# Patient Record
Sex: Male | Born: 1935 | Race: White | Hispanic: No | Marital: Married | Smoking: Former smoker
Health system: Southern US, Community
[De-identification: ages and names within clinical notes are randomized; demographics above are authoritative.]

## PROBLEM LIST (undated history)

## (undated) DIAGNOSIS — R1310 Dysphagia, unspecified: Secondary | ICD-10-CM

## (undated) DIAGNOSIS — J449 Chronic obstructive pulmonary disease, unspecified: Secondary | ICD-10-CM

## (undated) DIAGNOSIS — E785 Hyperlipidemia, unspecified: Secondary | ICD-10-CM

## (undated) DIAGNOSIS — F0150 Vascular dementia without behavioral disturbance: Secondary | ICD-10-CM

## (undated) DIAGNOSIS — K219 Gastro-esophageal reflux disease without esophagitis: Secondary | ICD-10-CM

## (undated) DIAGNOSIS — R011 Cardiac murmur, unspecified: Secondary | ICD-10-CM

## (undated) DIAGNOSIS — M1990 Unspecified osteoarthritis, unspecified site: Secondary | ICD-10-CM

## (undated) DIAGNOSIS — E119 Type 2 diabetes mellitus without complications: Secondary | ICD-10-CM

## (undated) DIAGNOSIS — I10 Essential (primary) hypertension: Secondary | ICD-10-CM

## (undated) DIAGNOSIS — Z8673 Personal history of transient ischemic attack (TIA), and cerebral infarction without residual deficits: Secondary | ICD-10-CM

## (undated) HISTORY — PX: TRANSURETHRAL RESECTION OF PROSTATE: SHX73

## (undated) HISTORY — DX: Chronic obstructive pulmonary disease, unspecified: J44.9

## (undated) HISTORY — DX: Dysphagia, unspecified: R13.10

## (undated) HISTORY — DX: Type 2 diabetes mellitus without complications: E11.9

## (undated) HISTORY — DX: Hyperlipidemia, unspecified: E78.5

## (undated) HISTORY — DX: Vascular dementia, unspecified severity, without behavioral disturbance, psychotic disturbance, mood disturbance, and anxiety: F01.50

## (undated) HISTORY — DX: Personal history of transient ischemic attack (TIA), and cerebral infarction without residual deficits: Z86.73

## (undated) HISTORY — DX: Essential (primary) hypertension: I10

## (undated) HISTORY — PX: EYE SURGERY: SHX253

---
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
## 1969-06-08 HISTORY — PX: INGUINAL HERNIA REPAIR: SUR1180

## 1999-06-09 HISTORY — PX: ESOPHAGEAL DILATION: SHX303

## 2002-11-10 ENCOUNTER — Encounter: Payer: Self-pay | Admitting: Otolaryngology

## 2002-11-10 ENCOUNTER — Ambulatory Visit (HOSPITAL_COMMUNITY): Admission: RE | Admit: 2002-11-10 | Discharge: 2002-11-10 | Payer: Self-pay | Admitting: Otolaryngology

## 2005-02-12 ENCOUNTER — Ambulatory Visit (HOSPITAL_COMMUNITY): Admission: RE | Admit: 2005-02-12 | Discharge: 2005-02-12 | Payer: Self-pay | Admitting: General Surgery

## 2005-02-12 ENCOUNTER — Ambulatory Visit (HOSPITAL_COMMUNITY): Admission: RE | Admit: 2005-02-12 | Discharge: 2005-02-12 | Payer: Self-pay | Admitting: Internal Medicine

## 2005-02-12 ENCOUNTER — Ambulatory Visit: Payer: Self-pay | Admitting: Internal Medicine

## 2006-10-18 ENCOUNTER — Ambulatory Visit: Payer: Self-pay | Admitting: Cardiology

## 2006-11-29 ENCOUNTER — Ambulatory Visit: Payer: Self-pay | Admitting: Cardiology

## 2006-12-06 ENCOUNTER — Inpatient Hospital Stay (HOSPITAL_BASED_OUTPATIENT_CLINIC_OR_DEPARTMENT_OTHER): Admission: RE | Admit: 2006-12-06 | Discharge: 2006-12-06 | Payer: Self-pay | Admitting: Cardiovascular Disease

## 2006-12-06 ENCOUNTER — Ambulatory Visit: Payer: Self-pay | Admitting: Cardiovascular Disease

## 2006-12-18 ENCOUNTER — Ambulatory Visit: Payer: Self-pay | Admitting: Cardiology

## 2007-01-20 ENCOUNTER — Ambulatory Visit: Payer: Self-pay | Admitting: Cardiology

## 2007-08-20 ENCOUNTER — Ambulatory Visit: Payer: Self-pay | Admitting: Cardiology

## 2007-09-15 ENCOUNTER — Ambulatory Visit: Payer: Self-pay | Admitting: Cardiology

## 2007-10-21 ENCOUNTER — Ambulatory Visit: Payer: Self-pay | Admitting: Cardiology

## 2008-11-25 ENCOUNTER — Ambulatory Visit: Payer: Self-pay | Admitting: Cardiology

## 2009-10-08 HISTORY — PX: CATARACT EXTRACTION W/ INTRAOCULAR LENS IMPLANT: SHX1309

## 2010-07-20 ENCOUNTER — Ambulatory Visit (HOSPITAL_COMMUNITY)
Admission: RE | Admit: 2010-07-20 | Discharge: 2010-07-21 | Payer: Self-pay | Source: Home / Self Care | Admitting: Ophthalmology

## 2010-07-20 ENCOUNTER — Encounter (INDEPENDENT_AMBULATORY_CARE_PROVIDER_SITE_OTHER): Payer: Self-pay | Admitting: Ophthalmology

## 2010-08-29 ENCOUNTER — Ambulatory Visit (HOSPITAL_COMMUNITY): Admission: RE | Admit: 2010-08-29 | Discharge: 2010-08-30 | Payer: Self-pay | Admitting: Ophthalmology

## 2010-08-29 HISTORY — PX: RETINAL DETACHMENT SURGERY: SHX105

## 2010-12-19 LAB — BASIC METABOLIC PANEL
BUN: 10 mg/dL (ref 6–23)
CO2: 30 mEq/L (ref 19–32)
Calcium: 9.3 mg/dL (ref 8.4–10.5)
Chloride: 103 mEq/L (ref 96–112)
Creatinine, Ser: 0.99 mg/dL (ref 0.4–1.5)
GFR calc Af Amer: >60 mL/min (ref >60)
GFR calc non Af Amer: >60 mL/min (ref >60)
Glucose, Bld: 108 mg/dL — ABNORMAL HIGH (ref 70–99)
Potassium: 4.5 mEq/L (ref 3.5–5.1)
Sodium: 138 mEq/L (ref 135–145)

## 2010-12-19 LAB — CBC
HCT: 44.2 % (ref 39.0–52.0)
Hemoglobin: 15.1 g/dL (ref 13.0–17.0)
MCH: 31.4 pg (ref 26.0–34.0)
MCHC: 34.2 g/dL (ref 30.0–36.0)
MCV: 91.9 fL (ref 78.0–100.0)
Platelets: 235 10*3/uL (ref 150–400)
RBC: 4.81 MIL/uL (ref 4.22–5.81)
RDW: 12.8 % (ref 11.5–15.5)
WBC: 7.8 10*3/uL (ref 4.0–10.5)

## 2010-12-19 LAB — SURGICAL PCR SCREEN
MRSA, PCR: NEGATIVE
Staphylococcus aureus: POSITIVE — AB

## 2010-12-21 LAB — BASIC METABOLIC PANEL
BUN: 12 mg/dL (ref 6–23)
CO2: 32 mEq/L (ref 19–32)
Chloride: 100 mEq/L (ref 96–112)
GFR calc non Af Amer: >60 mL/min (ref >60)
Glucose, Bld: 114 mg/dL — ABNORMAL HIGH (ref 70–99)
Potassium: 4.4 mEq/L (ref 3.5–5.1)
Sodium: 136 mEq/L (ref 135–145)

## 2010-12-21 LAB — CBC
HCT: 47.4 % (ref 39.0–52.0)
Hemoglobin: 16.4 g/dL (ref 13.0–17.0)
MCH: 31.8 pg (ref 26.0–34.0)
MCHC: 34.6 g/dL (ref 30.0–36.0)
MCV: 91.9 fL (ref 78.0–100.0)
RDW: 12.8 % (ref 11.5–15.5)

## 2010-12-21 LAB — SURGICAL PCR SCREEN: MRSA, PCR: NEGATIVE

## 2011-02-20 NOTE — Assessment & Plan Note (Signed)
[facility]                          [facility]   NAME:Luis [name], Luis [name]                      MRN:          [id number]  DATE:10/21/2007                            DOB:          28-Feb-1934    HISTORY OF PRESENT ILLNESS:  The patient is a 77 year old male with a  history of coronary artery disease treated medically.  The patient has  normal LV function.  The patient is doing quite well.  He denies any  substernal  chest pain on exertion.  He is an NYHA class II.  He has no  orthopnea, PND.  He has no palpitations or syncope.  Total cholesterol  recently measured was 146.  His EKG today demonstrates no significant  ischemic abnormalities.   MEDICATIONS:  1. Simvastatin 20 mg p.o. q.day.  2. Norvasc 5 mg p.o. q.day.  3. __________ 2.5 mg p.o. q.day  4. Advair 100/50 two puffs q.day.  5. Proscar 5 mg p.o. q.day.  6. Prilosec over-the-counter.  7. Aspirin 81 mg a day.  8. Fish oil.  9. Vitamin D and multiple vitamin.  10.Vitamin B.  11.Potassium supplements.   PHYSICAL EXAMINATION:  VITAL SIGNS:  Blood pressure:  151/80.  Heart  rate: 55 beats per minute.  Weight:  198 pounds.  NECK:  Normal carotid upstroke.  No carotid bruits.  LUNGS:  Clear.  Breath sounds bilaterally.  HEART:  Regular rate and rhythm with normal S1, S2 and no murmurs, rubs  or gallops.  ABDOMEN:  Soft, nontender.  No rebound or guarding.  Good bowel sounds.  EXTREMITIES:  No cyanosis, clubbing or edema.   PROBLEM:  1. Two vessel coronary artery disease treated medically.  2. Normal LV function.  3. Questionable small atrial septal defect with no significant step-up      by right heart catheterization.  4. Hypertension.  Controlled.  5. Bradycardia. Asymptomatic.   PLAN:  1. The patient is doing well from a cardiovascular perspective.  I      have made no adjustments to his medication.  2. The patient to consult with his primary care physician regarding     his cholesterol panel.  3. Depression.  Can be seen back in one year.     Learta Codding, MD,FACC  Electronically Signed    GED/MedQ  DD: 10/21/2007  DT: 10/21/2007  Job #: [id number]   cc:   Kirstie Peri, MD

## 2011-02-20 NOTE — Assessment & Plan Note (Signed)
[facility]                          [facility] NOTE   NAME:SCOTTBladyn, Tipps                      MRN:          [id number]  DATE:08/20/2007                            DOB:          Jun 04, 1934    HISTORY OF PRESENT ILLNESS:  Patient is a 77 year old male with history  of 2-vessel coronary artery disease.  He has been treated medically.  The patient has normal LV function.  Previously he was taken off of beta  blockers due to sinus bradycardia and syncope.  The patient also  demonstrated chronotropic insufficiency on stress testing.  He now  reports __________  but he does not have near syncope.  Sometimes he  reports some dizziness with the absence of chest pain, shortness of  breath, orthopnea and PND.  Orthostatics were done in the office today  which were within normal limits.   MEDICATIONS:  1. Advair 100/50 two puffs daily.  2. Proscar 5 mg p.o. daily.  3. Indapamide 0.5 mg daily.  4. Prilosec OTC 20 mg a day.  5. Aspirin 81 mg a day.  6. Fish oil.  7. Norvasc 5 mg p.o. daily.  8. Zocor 20 mg p.o. daily.   PHYSICAL EXAMINATION:  VITAL SIGNS:  Blood pressure 115/69, heart rate  59.  NECK:  Normal carotid upstroke and no carotid bruits.  LUNGS:  Clear.  HEART:  Regular rate and rhythm, normal S1-S2.  ABDOMEN:  Soft.  EXTREMITY:  No cyanosis, clubbing, or edema.   PROBLEM LIST:  1. Two vessel coronary artery disease treated medically.  2. Normal left ventricular function.  3. Questionable small atrial septal defect by 2D echo (no significant      __________  at right heart catheterization).  4. Hypertension, controlled.  5. Bradycardia.      a.     Currently asymptomatic.      b.     History of near syncope, rule out chronotropic       insufficiency.   PLAN:  1. Patient will have a CardioNet monitor applied to make sure His does      not have any significant bradycardias, although symptomatically      appears to be  improved.  2. From a cardiovascular perspective, patient is stable and he reports      no substernal chest      pain.  No further ischemia without is needed.  3. The patient will follow up with [place] in the next couple of months.     Learta Codding, MD,FACC  Electronically Signed    GED/MedQ  DD: 09/15/2007  DT: 09/15/2007  Job #: [id number]   cc:   Kirstie Peri, MD

## 2011-02-23 NOTE — Op Note (Signed)
NAME:  Luis [name], Luis [name]               ACCOUNT NO.:  [id number]   MEDICAL RECORD NO.:  [id number]          PATIENT TYPE:  AMB   LOCATION:  DAY                           FACILITY:  [facility]   PHYSICIAN:  R. Roetta Sessions, M.D. DATE OF BIRTH:  1934-10-08   DATE OF PROCEDURE:  DATE OF DISCHARGE:  02/12/2005                                 OPERATIVE REPORT   PROCEDURE:  Esophageal manometry.   REFERRING PHYSICIAN:  Sherilyn Cooter A. Cleotis Nipper, M.D.   INDICATIONS:  Dysphagia.   PRIOR STUDIES:  EGD on 12/21/2003 revealed a normal-appearing, upper, mid,  and lower esophagus, but the GE junction appeared to be grossly normal  except for a little bit narrow.  This was dilated with up to 21 [place] with  balloon dilatation.   METHOD:  The esophageal manometry study was performed using [employer]  Medical Gastrosoft Upper Gastrointestinal Polygram, version 6.40, using an  Arndorfer infusion system at 0.5 cc/min. The lower esophageal sphincter  (LES) was identified in four ports with the standard station pullthrough  technique. For esophageal body pressures, the tip of the catheter was placed  3 cm above the proximal aspect of the LES. Ten wet swallows were taken with  ports 5 cm apart in the esophageal body. This procedure was reviewed with  the patient prior to performing it.   FINDINGS:  Esophageal body amplitude (mmHg):  24.9 at 13 cm (normal 38-102),  189.1 at 8 cm (normal 49-131), 41.2 at 3 cm (normal 64-154), 115.2 mean  (distal two ports) (normal 59-139).   Duration (seconds):  5.3 at 13 cm (normal 3-5), 8.3 at 8 cm (normal 3-5), 8  at 3 cm (normal 2.5-3.5), 8.2 mean (distal two ports) (normal 3.5).   Contractions (with wet swallows):  9 of 9 swallows or simultaneous.   LOWER ESOPHAGEAL SPHINCTER (LES):  Located at 50-46 cm (from nares).  Resting pressure:  27.4 mmHg.  Relaxation:  67.3%.   IMPRESSION:  Manometrically this patient appears to have achalasia.  There  were isobaric pressure changes  with swallows.  No evidence of peristalsis.  In addition, channel 3 analysis revealed a high amplitude of swallows with a  prolonged duration of swallows in all channels.   RECOMMENDATIONS:  This study has been reviewed with Dr. Karilyn Cota who is in  agreement that this patient appears to have achalasia based on this  manometric study.  Would recommend patient to have a barium swallow, if not  previously done.  Would consider a tertiary center referral for possible  Heller's myotomy.  If any further assistance is needed, please contact our  office at [phone].      LL/MEDQ  D:  02/23/2005  T:  02/23/2005  Job:  [id number]   cc:   Sherilyn Cooter A. Cleotis Nipper, M.D.  [address]  [place]  [place] [address]  Fax: [phone]

## 2011-02-23 NOTE — Assessment & Plan Note (Signed)
[facility]                          [facility] NOTE   Luis [name], Luis [name]                      MRN:          [id number]  DATE:12/18/2006                            DOB:          28-Sep-1934    CARDIOLOGIST:  Learta Codding, MD,FACC   PRIMARY CARE PHYSICIAN:  Kirstie Peri, MD   HISTORY OF PRESENT ILLNESS:  Luis [name] is a 77 year old male patient who  was seen initially on November 29, 2006, with recent history of near  syncopal episode.  A 2D echocardiogram showed normal LV function and no  wall motion abnormalities and a question of a small ASD. He also had an  adenosine Myoview that revealed moderate inferior defect suggestive of  either scar or diaphragmatic attenuation. The patient also noted some  exertional dyspnea. On February 22, his Diltiazem was discontinued and  he was placed on Norvasc 5 mg a day. He was set up for outpatient  cardiac catheterization. This was done on December 06, 2006, by Dr.  Excell Seltzer. There were normal right heart hemodynamics without evidence of a  significant oxygen step up. He did have two vessel coronary disease that  was treated medically. Specifically, he had a diagonal with a 75% ostial  lesion and a left posterolateral branch with an 80% distal lesion.  Again, these were treated medically. He was placed on metoprolol 25 mg  twice a day. He returns to the [place] office today for followup.   Overall, he notes that his dyspnea on exertion is improved. He denies  any chest pain. He denies any syncope. He denies any further episodes of  near syncope. He denies any significant fatigue.   CURRENT MEDICATIONS:  1. Advair 100/50 two puffs per day.  2. Proscar 5 mg daily.  3. Indapamide 2.5 mg daily.  4. Prilosec over-the-counter 20 mg a day.  5. Aspirin 81 mg daily.  6. Red Yeast rice 600 mg two tablets a day.  7. Fish oil 1 gram three tablets daily.  8. Multivitamin.  9. Vitamin D.  10.Norvasc 5 mg a  day.  11.Metoprolol 25 mg b.i.d.   ALLERGIES:  No known drug allergies.   PHYSICAL EXAMINATION:  He is a well-nourished, well-developed male in no  acute distress. Blood pressure is 127/72, pulse rate is 49, weight is  198.6 pounds.  HEENT: Is unremarkable.  NECK: Without JVD. Carotids without bruits bilaterally.  CARDIAC: Normal S1, S2, regular rate and rhythm without murmur.  LUNGS:  Are clear to auscultation bilaterally without wheezing, rhonchi  or rales.  ABDOMEN: Soft and nontender.  EXTREMITIES: Without edema. Right groin without significant hematoma or  bruit.  NEUROLOGIC: He is alert and oriented x3. Cranial nerves II-XII are  grossly intact.   ELECTROCARDIOGRAM: Reveals sinus bradycardia with a heart rate of 47.  Normal axis and no acute changes.   IMPRESSION:  1. Two vessel coronary artery disease as described above.      a.     Medical therapy.  2. Good left ventricular function.  3. Question of small ASD on recent 2D echocardiogram with no  significant oxygen step up on right heart catheterization.  4. Hypertension, controlled.  5. Sinus bradycardia.      a.     Fairly asymptomatic at this point in time.  6. Gastroesophageal reflux disease with a history of esophageal spasm.  7. Dyslipidemia.      a.     Labs November 2007 with an LDL 117, HDL of 55. Total       cholesterol is 187. Triglycerides of 76.   PLAN:  The patient presents to the office today for followup post  catheterization. Overall, he is doing well. He was placed on metoprolol  at his catheterization for treatment of his coronary disease and his  dyspnea which was felt to be an angina equivalent. The patient has  actually felt well since that time. However, since he did present to [place]  initially with a near-syncopal episode and he is significantly  bradycardic, we have elected to take him off of the metoprolol. I  discussed this with Dr. Andee Lineman, who agreed. We will also ask him to stop  his  Red Yeast rice and to start on a statin. Given his proven coronary  disease at cardiac catheterization and an LDL above 70, we feel it is  best to treat this with a statin. Therefore, he will be placed on Zocor  20 mg QHS. Will have him followup with lipids and LFTs in 6 to 8 weeks.  He has been given nitroglycerine to take p.r.n. chest pain should that  ever occur. We have asked him to followup in this office in the next 4  to 6 weeks. At that time, if he continues to remain bradycardic, we will  set him up for a 48-hour Holter monitor to rule out any significant  arrhythmias or pauses. If he  has worsening dyspnea on exertion we can certainly try to up-titrate his  amlodipine or initiate Imdur for anti-anginal effect.      Tereso Newcomer, PA-C  Electronically Signed      Learta Codding, MD,FACC  Electronically Signed   SW/MedQ  DD: 12/18/2006  DT: 12/19/2006  Job #: [id number]   cc:   Kirstie Peri, MD

## 2011-02-23 NOTE — Assessment & Plan Note (Signed)
[facility]                          [facility]   NAME:Luis [name], Redmann                      MRN:          [id number]  DATE:11/29/2006                            DOB:          17-Apr-1934    REASON FOR CONSULTATION:  Mr. Edon is a 77 year old male, with no  prior cardiac history, now referred for further evaluation of a recent  presyncopal episode.  Additionally, he underwent a subsequent workup  with a 2D echocardiogram and an adenosine stress Cardiolite.  The  echocardiogram showed preserved left ventricular function with no wall  motion abnormalities and question of a small ASD.  The adenosine stress  test suggested a moderate inferior defect suggestive of either scar or  diaphragmatic attenuation, but with no large area of ischemia.  Calculated ejection fraction was 61% and there was a probable mild  inferior hypokinesis.   The patient describes this recent episode of presyncope as occurring  quite suddenly while he was seated at a bar with his wife.  It happened  about a month ago and there was no associated chest discomfort or tachy  palpitations.  Of note, in fact, the patient states that he has never  had any chest discomfort either remotely, or in recent past.  He did  feel a sensation of clamminess and the episode lasted about 2 minutes  in duration.  He has not had any subsequent episodes.  He seems to also  suggest that he has had some similar episodes prior to this, but not of  such long duration.   The patient also reports some development of exertional dyspnea over the  past 6-12 months, but once again with no associated chest discomfort.  He also has felt a sensation of tiredness in both upper thighs which  is relieved with rest.   Electrocardiogram today reveals sinus bradycardia with borderline first  degree AV block, normal axis, and no ischemic changes.   ALLERGIES:  NO KNOWN DRUG ALLERGIES.   CURRENT  MEDICATIONS:  1. Aspirin 81 daily.  2. Diltiazem 120 daily.  3. Advair 100/50 two puffs daily.  4. Proscar 5 daily.  5. Indapamide 2.5 daily.  6. Prilosec OTC 20 daily.  7. Red Yeast Rice 1200 mg daily.  8. Fish oil 3000 mg daily.   PAST MEDICAL HISTORY:  1. Hypertension.  2. Esophageal spasm.      a.     Diagnosed approximately 1 year ago by Dr. Jacqulyn Bath at [facility], and       treated with Diltiazem.  3. GERD.  4. Remote tobacco.   SURGICAL HISTORY:  1. Status post bilateral hernia repair.  2. TURP.  3. Right shoulder arthroscopic surgery.   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  He has not smoked tobacco sine 1987 but previously smoked up to 2  packs a day since the age of 48.  Drinks only an occasional beer.   FAMILY HISTORY:  Father died age 70, question  of myocardial infarction.  Mother deceased age 64, complications of stroke.   REVIEW OF SYSTEMS:  Denies any history of myocardial infarction,  congestive heart failure, or stroke.  No known history of diabetes  mellitus or hyperlipidemia.  Denies any history of palpitations.  Denies  orthopnea, PND, or lower extremity edema.  Otherwise as per HPI,  remaining systems negative.   PHYSICAL EXAMINATION:  Blood pressure 138/76, pulse 50 regular, weight  198.8.  GENERAL:  A 77 year old male sitting upright in no apparent distress.  HEENT:  Normocephalic/atraumatic.  NECK:  Palpable bilateral carotid pulses without bruits; no JVD.  LUNGS:  Clear to auscultation all fields.  HEART:  Regular rate and rhythm (S1, S2) no significant murmurs, rubs,  or gallops.  ABDOMEN:  Soft, nontender, intact bowel sounds.  EXTREMITIES:  Palpable bilateral femoral pulses without bruits;  minimally palpable posterior tibialis and dorsalis pedis pulse on the  right but with brisk pulses on the left and bilateral popliteal pulses;  no  pedal edema.  NEURO:  No focal deficit.   IMPRESSION:  1. Status post near syncopal episode.  2. Exertional dyspnea.      a.     Normal left ventricular function by recent 2D echo.      b.     Question anginal equivalent.      c.     Recent abnormal adenosine Cardiolite, suggestive of inferior       defect; ejection fraction 61% with probable mild inferior       hypokinesis.  3. Remote tobacco.  4. Sinus bradycardia/first degree atrioventricular block.   PLAN:  1. Recommendation is to pursue further evaluation with a diagnostic      cardiac catheterization, in light of the recent abnormal stress      test, to rule out significant underlying coronary artery disease.      It may be that the patient's exertional dyspnea is his anginal      equivalent.  Of note, we will schedule this as a right/left cardiac      catheterization for further assessment of right heart pressures as      well as question of possible small ASD as seen on 2D echo.  2. Discontinue Diltiazem and start Norvasc 5 daily for treatment of      esophageal spasm, particularly in light of the patient's resting      bradycardia and recent episode of near syncope.  3. The patient is in agreement with this plan and the risks/benefits      of the procedure were discussed by Dr. Lewayne Bunting.     Gene Serpe, PA-C  Electronically Signed    GS/MedQ  DD: 11/29/2006  DT: 11/29/2006  Job #: [id number]   cc:   Kirstie Peri, MD

## 2011-02-23 NOTE — Assessment & Plan Note (Signed)
[name]                          [facility] NOTE   COLE, EASTRIDGE                      MRN:          [id number]  DATE:01/20/2007                            DOB:          1934-06-02    CARDIOLOGIST:  Dr. Andee Lineman.   PRIMARY CARE PHYSICIAN:  Dr. Sherryll Burger   HISTORY OF PRESENT ILLNESS:  Mr. Glasscock is a 77 year old male patient who  returns to the office today for followup.  Please see my last note from  December 18, 2006.  He had been evaluated for near syncope and dyspnea with  exertion.  Cardiac catheterization revealed 2 vessel CAD that was  treated medically.  He had good left ventricular function.  There was a  question of a small ASD on the recent 2-D echocardiogram with no  significant oxygen step-up on right heart catheterization at time of  visit of left heart catheterization.  I took him off of his metoprolol  last time because he was significantly bradycardic.  We also placed him  on Zocor to treat his cholesterol, to get him down to an LDL of less  than 70. He returns today for followup.  He notes that since stopping  the metoprolol he does feel somewhat better.  He feels like he has more  energy.  He still notes dyspnea on exertion.  He does tell me that he  had some problems with dyspnea on exertion over the last several years  and was actually on Advair for COPD.  He has noted in the past that if  he skips his Advair for several days, he will become more short of  breath.  He denies any chest discomfort.  He denies any syncope or near  syncope.   CURRENT MEDICATIONS:  1. Advair 100/50 two puffs daily.  2. Proscar 5 mg daily.  3. Dopamine 2.5 mg daily.  4. Prilosec OTC 20 mg daily.  5. Aspirin 81 mg daily.  6. Fish oil.  7. Multivitamin.  8. Vitamin D.  9. Norvasc 5 mg daily.  10.Zocor 20 mg q.h.s.  11.Nitroglycerine p.r.n. chest pain.   ALLERGIES:  No known drug allergies.   PHYSICAL EXAMINATION:  He is a  well-nourished, well-developed male in no  acute distress.  His blood pressure is 151/77, pulse 82, weight 198.8 pounds.  Repeat  blood pressure by me is 120/76 bilaterally on a manual cuff.  HEENT:  Is unremarkable.  NECK:  Without JVD.  CARDIAC:  Normal S1, S2. Regular rate and rhythm.  LUNGS:  Are clear to auscultation bilaterally without wheezing, rhonchi  or rales.  ABDOMEN:  Soft, nontender with normal active bowel sounds.  No  organomegaly.  EXTREMITIES:  Without edema.  Calves nontender.  SKIN:  Is warm and dry.  NEUROLOGIC:  He is alert and oriented x3.  Cranial nerves II through XII  grossly intact.   Electrocardiogram reveals a sinus rhythm with a heart rate of 51 with PR  interval of 212 milliseconds.  No ischemic changes.   IMPRESSION:  1. Two vessel coronary artery disease, treated medically.  a.     At catheterization 75% ostial diagonal lesion and 80% distal       left post lateral branch lesion.      b.     Medical therapy.  2. Good left ventricular function.  3. Question small atrial septal defect on 2-D echocardiogram with no      significant oxygen step-up on right heart catheterization.  4. Hypertension, controlled.  5. Sinus bradycardia.      a.     Asymptomatic.      b.     Recent history of near syncope, prompting workup for       coronary disease as outlined above.      c.     Gastroesophageal reflux disease with a history of esophageal       spasm.      d.     Dyslipidemia, treated.   PLAN:  The patient presents back to the office today for followup.  We  took him off of his metoprolol last time and he still remains  bradycardic.  He has not had any further episodes of near syncope and  denies any history of syncope.  Since we did initially see him for that  chief complaint, we will go ahead and set him up for a 48 hour Holter  monitor to rule out significant bradyarrhythmias.  We will continue all  of his other medications at this point in  time.  We will make sure he  has lipids and LFTs followed up in the next 2 to 4 weeks.  As long as no  significant abnormalities show up on his Holter monitor, he will return  to see [place] in this office in 6 months' time.      Tereso Newcomer, PA-C  Electronically Signed      Learta Codding, MD,FACC  Electronically Signed   SW/MedQ  DD: 01/20/2007  DT: 01/20/2007  Job #: [id number]   cc:   Kirstie Peri, MD

## 2011-02-23 NOTE — Cardiovascular Report (Signed)
NAME:  Luis [name], Luis [name] NO.:  [id number]   MEDICAL RECORD NO.:  [id number]          PATIENT TYPE:  OIB   LOCATION:  1963                         FACILITY:  [facility]   PHYSICIAN:  Veverly Fells. Excell Seltzer, MD  DATE OF BIRTH:  14-Nov-1933   DATE OF PROCEDURE:  12/06/2006  DATE OF DISCHARGE:                            CARDIAC CATHETERIZATION   PROCEDURE:  Left heart catheterization, right heart catheterization,  selective coronary angiography, left ventricular angiography.   INDICATIONS:  Luis [name] is a very nice 77 year old gentleman who has  exertional chest pain.  He underwent an echocardiogram that raised a  question of a small ASD.  He has had normal left ventricular function  with an EF of 61%.  He underwent an adenosine stress test that suggested  a moderate fixed defect in the inferior wall, either scar or  diaphragmatic attenuation, but no large area of ischemia.  With his  abnormal stress test and question of ASD, he was referred for right and  left heart catheterization.   DESCRIPTION OF PROCEDURE:  The risks and indications of the procedure  were explained to the patient.  Informed consent was obtained.  The  right groin was prepped, draped and anesthetized with 1% lidocaine.  Using the modified Seldinger technique a 7-French sheath was placed in  the right femoral vein and a 4-French sheath was placed in the right  femoral artery.  The right heart catheterization was performed first  using a Swan-Ganz catheter.  Pressures were measured throughout the  right heart chambers from the right atrium to the pulmonary capillary  wedge position.  Oxygen saturation's were drawn from the superior vena  cava, pulmonary artery and central aorta.  Fick cardiac output was  calculated.  Following the right heart catheterization, selective  coronary angiography was performed using standard 4-French catheters.  Multiple views of the left coronary artery were taken and a single  view  of the right coronary artery was taken.  An angled pigtail catheter was  then inserted into the left ventricle and pressures were recorded.  A 30  degrees right anterior oblique left ventriculogram was done.  A pullback  across the aortic valve was performed.  Following the left heart  catheterization I made an attempt to cross the presumed ASD with the  Swan-Ganz catheter, but was unable to do so.  At the conclusion of the  case the femoral, venous, and arterial sheaths were pulled and manual  pressure used for hemostasis.  There were no complications.   FINDINGS:  Right atrial pressure A-wave 4, V-wave 1, mean of 0.  Right  ventricular pressure is 24/0 with an end-diastolic pressure of 4.  Pulmonary artery pressures is 24/5 with a mean of 14.  Pulmonary  capillary wedge pressures: A-wave 6, V-wave 3, mean of 3.  Left  ventricular pressures: 126/0 with an end-diastolic pressure of 7, aortic  pressure is 126/57 with a mean of 87.  Oxygen saturation's as follows:  SVC 66, PA 73, AO 93.   Fick cardiac output was 5.5 liters per minute.  Cardiac index is 2.6  liters  per minute per meter squared.   Selective coronary angiography demonstrated the following:  Left main  coronary artery is angiographically normal.  It bifurcates into the LAD  and left circumflex.  The LAD is a large-caliber vessel that courses  down the left ventricular apex.  There is no significant disease  throughout the proximal, mid or distal LAD.  There is a very large  diagonal branch that is bigger than the LAD.  This vessel has a 75%  ostial smooth stenosis.   Left circumflex is dominant.  It gives off a distal left posterolateral  branch as well as a left PDA.  The proximal and mid circumflex are  tortuous but have no significant obstructive disease.  The left PDA has  no significant disease.  The left posterolateral branch has a focal 80%  stenosis out distally.  The vessel through this portion is small.    Right coronary artery is nondominant and small.  It is angiographically  normal.   Left ventricular function, as assessed by left ventriculography,  demonstrates normal wall motion with an overall ejection fraction of 65%  and no mitral regurgitation.   ASSESSMENT:  1. Two-vessel coronary artery disease involving a large first diagonal      branch of the left anterior descending and a left posterolateral      branch of the circumflex.  2. Normal left ventricular function.  3. Normal right heart hemodynamics without evidence of a significant      oxygen step-up.   PLAN:  I recommend medical therapy for Luis [name].  He did not have a  reversible defect on his stress study.  His anatomy was somewhat  challenging for consideration of percutaneous intervention, as the  diagonal stenosis involves the ostium of that vessel. It will be  difficult to treat this and protect the LAD.  The other issue is the  left posterolateral branch is quite small and would involve a small bare  metal stent.  I think if we were able to control Luis [name] angina  medically he would be better served with that approach.  If not, he  could be considered for percutaneous intervention if medical therapy  fails.      Veverly Fells. Excell Seltzer, MD  Electronically Signed     MDC/MEDQ  D:  12/06/2006  T:  12/06/2006  Job:  [id number]   cc:   Learta Codding, MD,FACC

## 2011-09-19 ENCOUNTER — Encounter (INDEPENDENT_AMBULATORY_CARE_PROVIDER_SITE_OTHER): Payer: Medicare Other | Admitting: Ophthalmology

## 2011-09-19 DIAGNOSIS — H33309 Unspecified retinal break, unspecified eye: Secondary | ICD-10-CM

## 2011-09-19 DIAGNOSIS — H35379 Puckering of macula, unspecified eye: Secondary | ICD-10-CM

## 2011-09-19 DIAGNOSIS — H33009 Unspecified retinal detachment with retinal break, unspecified eye: Secondary | ICD-10-CM

## 2011-09-19 DIAGNOSIS — H43819 Vitreous degeneration, unspecified eye: Secondary | ICD-10-CM

## 2012-01-02 ENCOUNTER — Other Ambulatory Visit: Payer: Self-pay | Admitting: Dermatology

## 2012-09-19 ENCOUNTER — Ambulatory Visit (INDEPENDENT_AMBULATORY_CARE_PROVIDER_SITE_OTHER): Payer: Medicare Other | Admitting: Ophthalmology

## 2012-10-09 ENCOUNTER — Ambulatory Visit (INDEPENDENT_AMBULATORY_CARE_PROVIDER_SITE_OTHER): Payer: Medicare Other | Admitting: Ophthalmology

## 2012-10-09 DIAGNOSIS — H33009 Unspecified retinal detachment with retinal break, unspecified eye: Secondary | ICD-10-CM

## 2012-10-09 DIAGNOSIS — H2510 Age-related nuclear cataract, unspecified eye: Secondary | ICD-10-CM

## 2012-10-09 DIAGNOSIS — H35349 Macular cyst, hole, or pseudohole, unspecified eye: Secondary | ICD-10-CM

## 2012-10-09 DIAGNOSIS — H43819 Vitreous degeneration, unspecified eye: Secondary | ICD-10-CM

## 2012-10-09 DIAGNOSIS — H33309 Unspecified retinal break, unspecified eye: Secondary | ICD-10-CM

## 2012-10-15 ENCOUNTER — Other Ambulatory Visit: Payer: Self-pay | Admitting: Dermatology

## 2012-10-27 ENCOUNTER — Ambulatory Visit (INDEPENDENT_AMBULATORY_CARE_PROVIDER_SITE_OTHER)
Admission: RE | Admit: 2012-10-27 | Discharge: 2012-10-27 | Disposition: A | Discharge status: Home / Self Care | Payer: Medicare Other | Source: Ambulatory Visit | Attending: Internal Medicine | Admitting: Internal Medicine

## 2012-10-27 ENCOUNTER — Ambulatory Visit (INDEPENDENT_AMBULATORY_CARE_PROVIDER_SITE_OTHER): Payer: Medicare Other | Admitting: Internal Medicine

## 2012-10-27 ENCOUNTER — Encounter: Payer: Self-pay | Admitting: Internal Medicine

## 2012-10-27 VITALS — BP 110/68 | HR 58 | Ht 70.5 in | Wt 194.4 lb

## 2012-10-27 DIAGNOSIS — J438 Other emphysema: Secondary | ICD-10-CM

## 2012-10-27 DIAGNOSIS — J439 Emphysema, unspecified: Secondary | ICD-10-CM

## 2012-10-27 DIAGNOSIS — J449 Chronic obstructive pulmonary disease, unspecified: Secondary | ICD-10-CM

## 2012-10-27 DIAGNOSIS — R439 Unspecified disturbances of smell and taste: Secondary | ICD-10-CM

## 2012-10-27 DIAGNOSIS — R430 Anosmia: Secondary | ICD-10-CM

## 2012-10-27 MED ORDER — IPRATROPIUM BROMIDE 0.06 % NA SOLN: NASAL | Status: DC

## 2012-10-27 NOTE — Patient Instructions (Addendum)
Script for ipratropium nasal spray- try as needed for runny nose  Order- CXR   Dx COPD             Schedule PFT and   6 MWT

## 2012-10-27 NOTE — Progress Notes (Signed)
10/27/12- 79 yo M former smoker- Pt referred by Dr Sherryll Burger. pt states having sob upon heavy activity. Wife is a patient of mine. He had smoked one to 2 packs per day for 35 years, ending in 45. 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 He was diagnosed with COPD/emphysema by pulmonologist Dr. Orson Aloe from [place]. Shortness of breath with exertion for years is getting gradually worse. Cough occasionally productive of clear mucus with no blood. He can climb one flight of stairs without stopping his breathing hard at the top. No chest pain, palpitation or swelling. He has not noticed dramatic benefit from tudorza, albuterol HFA,, Advair 250. He also complains of anosmia for at least several years. He blames this on working with ammonia fumes. He is using Flonase and Atrovent nasal sprays Father died of DVT/PE mother died of stroke. He doesn't know of any family members with lung disease.  // needs a1AT//  Prior to Admission medications   Medication Sig Start Date End Date Taking? Authorizing Provider  Aclidinium Bromide (TUDORZA PRESSAIR) 400 MCG/ACT AEPB Inhale 1 Inhaler into the lungs 2 (two) times daily.    Yes Historical Provider, MD  albuterol (PROVENTIL HFA;VENTOLIN HFA) 108 (90 BASE) MCG/ACT inhaler Inhale 2 puffs into the lungs every 6 (six) hours as needed.   Yes Historical Provider, MD  amLODipine (NORVASC) 5 MG tablet Take 5 mg by mouth daily.   Yes Historical Provider, MD  aspirin 81 MG tablet Take 81 mg by mouth daily.   Yes Historical Provider, MD  Cholecalciferol (VITAMIN D3) 2000 UNITS TABS Take 1 tablet by mouth once.   Yes Historical Provider, MD  fish oil-omega-3 fatty acids 1000 MG capsule Take 2 g by mouth daily.   Yes Historical Provider, MD  fluticasone (FLONASE) 50 MCG/ACT nasal spray Place 2 sprays into the nose daily.   Yes Historical Provider, MD  fluticasone-salmeterol (ADVAIR HFA) 230-21 MCG/ACT inhaler Inhale 2  puffs into the lungs 2 (two) times daily.   Yes Historical Provider, MD  indapamide (LOZOL) 2.5 MG tablet Take 2.5 mg by mouth every morning.   Yes Historical Provider, MD  Multiple Vitamin (MULTIVITAMIN) tablet Take 1 tablet by mouth daily.   Yes Historical Provider, MD  nitroGLYCERIN (NITROSTAT) 0.4 MG SL tablet Place 0.4 mg under the tongue every 5 (five) minutes as needed.   Yes Historical Provider, MD  omeprazole (PRILOSEC) 20 MG capsule Take 20 mg by mouth daily.   Yes Historical Provider, MD  polyethylene glycol (MIRALAX / GLYCOLAX) packet Take 17 g by mouth daily.   Yes Historical Provider, MD  potassium phosphate, monobasic, (K-PHOS ORIGINAL) 500 MG tablet Take 500 mg by mouth 2 (two) times daily.   Yes Historical Provider, MD  ipratropium (ATROVENT) 0.06 % nasal spray 1 or 2 puffs in each nostril, up to 4 x daily as needed 10/27/12 10/27/13  Waymon Budge, MD   Past Medical History  Diagnosis Date  . COPD (chronic obstructive pulmonary disease)   . Hypertension   . Dysphagia   . High cholesterol   . Emphysema   \ Past Surgical History  Procedure Date  . Prostate surgery   . Retinal detachment surgery    History reviewed. No pertinent family history. History   Social History  . Marital Status: Married    Spouse Name: N/A    Number of Children: N/A  . Years of Education: N/A   Occupational History  .  Not on file.   Social History Main Topics  . Smoking status: Former Smoker -- 2.0 packs/day for 35 years    Types: Cigarettes    Quit date: 10/08/1985  . Smokeless tobacco: Former Neurosurgeon    Types: Chew    Quit date: 10/08/1997  . Alcohol Use: No  . Drug Use: No  . Sexually Active: Not on file   Other Topics Concern  . Not on file   Social History Narrative  . No narrative on file   ROS-see HPI Constitutional:   No-   weight loss, night sweats, fevers, chills, fatigue, lassitude. HEENT:   No-  headaches, difficulty swallowing, tooth/dental problems, sore throat,         No-  sneezing, itching, ear ache, nasal congestion, post nasal drip,  CV:  No-   chest pain, orthopnea, PND, swelling in lower extremities, anasarca,                                  dizziness, palpitations Resp: + shortness of breath with exertion or at rest.              +  productive cough,  No non-productive cough,  No- coughing up of blood.              No-   change in color of mucus.  No- wheezing.   Skin: No-   rash or lesions. GI:  No-   heartburn, indigestion, abdominal pain, nausea, vomiting, diarrhea,                 change in bowel habits, loss of appetite GU: No-   dysuria, change in color of urine, no urgency or frequency.  No- flank pain. MS:  No-   joint pain or swelling.  No- decreased range of motion.  No- back pain. Neuro-     nothing unusual Psych:  No- change in mood or affect. No depression or anxiety.  No memory loss.  OBJ- Physical Exam General- Alert, Oriented, Affect-appropriate, Distress- none acute. Medium build. Skin- rash-none, lesions- none, excoriation- none Lymphadenopathy- none Head- atraumatic            Eyes- Gross vision intact, PERRLA, conjunctivae and secretions clear            Ears- Hearing, canals-normal            Nose- Clear, no-Septal dev, mucus, polyps, erosion, perforation             Throat- Mallampati II , mucosa clear , drainage- none, tonsils- atrophic. Dental repair. Neck- flexible , trachea midline, no stridor , thyroid nl, carotid no bruit Chest - symmetrical excursion , unlabored           Heart/CV- RRR , no murmur , no gallop  , no rub, nl s1 s2                           - JVD- none , edema- none, stasis changes- none, varices- none           Lung- + distant without wheeze or rhonchi.+ Light cough with deep breath, wheeze- none,  , dullness-none, rub- none           Chest wall-  Abd- tender-no, distended-no, bowel sounds-present, HSM- no Br/ Gen/ Rectal- Not done, not indicated Extrem- cyanosis- none, clubbing, none,  atrophy- none, strength- nl Neuro-  grossly intact to observation

## 2012-11-08 DIAGNOSIS — J449 Chronic obstructive pulmonary disease, unspecified: Secondary | ICD-10-CM | POA: Insufficient documentation

## 2012-11-08 DIAGNOSIS — R430 Anosmia: Secondary | ICD-10-CM | POA: Insufficient documentation

## 2012-11-08 NOTE — Assessment & Plan Note (Signed)
History is quite consistent with tobacco-induced COPD, predominantly emphysema. We will establish database Plan- chest x-ray, PFT, 6 minute walk test

## 2012-11-08 NOTE — Assessment & Plan Note (Signed)
Watery rhinorrhea bothersome especially with food. He has had ipratropium nasal spray before but has not been using it regularly. I will let him try again so he can pay particular attention to effect on food related rhinorrhea. We may not be able to restore his sense of smell if there is damage to olfactory nerves.

## 2012-11-12 ENCOUNTER — Ambulatory Visit (INDEPENDENT_AMBULATORY_CARE_PROVIDER_SITE_OTHER): Payer: Medicare Other | Admitting: Internal Medicine

## 2012-11-12 DIAGNOSIS — J449 Chronic obstructive pulmonary disease, unspecified: Secondary | ICD-10-CM

## 2012-11-12 LAB — PULMONARY FUNCTION TEST

## 2012-11-12 NOTE — Progress Notes (Signed)
PFT done today. 

## 2012-11-13 ENCOUNTER — Other Ambulatory Visit: Payer: Self-pay | Admitting: Dermatology

## 2012-11-27 ENCOUNTER — Encounter: Payer: Self-pay | Admitting: Internal Medicine

## 2012-11-27 ENCOUNTER — Other Ambulatory Visit: Payer: Medicare Other

## 2012-11-27 ENCOUNTER — Ambulatory Visit (INDEPENDENT_AMBULATORY_CARE_PROVIDER_SITE_OTHER): Payer: Medicare Other | Admitting: Internal Medicine

## 2012-11-27 VITALS — BP 102/58 | HR 66 | Ht 70.5 in | Wt 192.8 lb

## 2012-11-27 DIAGNOSIS — J438 Other emphysema: Secondary | ICD-10-CM

## 2012-11-27 DIAGNOSIS — J309 Allergic rhinitis, unspecified: Secondary | ICD-10-CM

## 2012-11-27 DIAGNOSIS — J300 Vasomotor rhinitis: Secondary | ICD-10-CM

## 2012-11-27 DIAGNOSIS — J439 Emphysema, unspecified: Secondary | ICD-10-CM

## 2012-11-27 MED ORDER — IPRATROPIUM BROMIDE 0.06 % NA SOLN: NASAL | Status: DC

## 2012-11-27 NOTE — Progress Notes (Signed)
10/27/12- 79 yo M former smoker- Pt referred by Dr Sherryll Burger. pt states having sob upon heavy activity. Wife is a patient of mine. He had smoked one to 2 packs per day for 35 years, ending in 68. 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 He was diagnosed with COPD/emphysema by pulmonologist Dr. Orson Aloe from [place]. Shortness of breath with exertion for years is getting gradually worse. Cough occasionally productive of clear mucus with no blood. He can climb one flight of stairs without stopping- he's breathing hard at the top. No chest pain, palpitation or swelling. He has not noticed dramatic benefit from tudorza, albuterol HFA,, Advair 250. He also complains of anosmia for at least several years. He blames this on working with ammonia fumes. He is using Flonase and Atrovent nasal sprays Father died of DVT/PE mother died of stroke. He doesn't know of any family members with lung disease.  // needs a1AT//  11/27/12- 78 yoM former smoker followed for COPD       PCP Dr Kirstie Peri FOLLOWS FOR: reivew PFT and with patient; still having SOB with heavy activity. Wife here. No change in no acute event since last here. Persistent dyspnea with exertion but he does 30 minutes daily at 3 miles per hour on treadmill. Likes ipratropium nasal spray. Has had pneumonia vaccine after age 31. CXR 10/28/12 IMPRESSION:  COPD. No active lung disease.  Original Report Authenticated By: Dwyane Dee, M.D. PFT2/5/14- severe obstructive airways disease with slight response to bronchodilator. Air-trapping with increased residual volume. Diffusion mildly reduced. Emphysema pattern. FVC 2.56/59%, FEV1 1.17/41%, FEV1/FVC 0.45. Residual volume 132%, DLCO 78%. 6MWT-11/12/12- 96%, 94%, 96%, 454 m. Good distance with oxygenation well maintained.  ROS-see HPI Constitutional:   No-   weight loss, night sweats, fevers, chills, fatigue, lassitude. HEENT:   No-  headaches,  difficulty swallowing, tooth/dental problems, sore throat,       No-  sneezing, itching, ear ache, nasal congestion, post nasal drip,  CV:  No-   chest pain, orthopnea, PND, swelling in lower extremities, anasarca,                                  dizziness, palpitations Resp: + shortness of breath with exertion or at rest.              +scant  productive cough,  No non-productive cough,  No- coughing up of blood.              No-   change in color of mucus.  No- wheezing.   Skin: No-   rash or lesions. GI:  No-   heartburn, indigestion, abdominal pain, nausea, vomiting,  GU:  MS:  No-   joint pain or swelling.   Neuro-     nothing unusual Psych:  No- change in mood or affect. No depression or anxiety.  No memory loss.  OBJ- Physical Exam General- Alert, Oriented, Affect-appropriate, Distress- none acute. Medium build. Skin- rash-none, lesions- none, excoriation- none Lymphadenopathy- none Head- atraumatic            Eyes- Gross vision intact, PERRLA, conjunctivae and secretions clear            Ears- Hearing, canals-normal            Nose- Clear, no-Septal dev, mucus, polyps, erosion, perforation  Throat- Mallampati II , mucosa clear , drainage- none, tonsils- atrophic. Dental repair. Neck- flexible , trachea midline, no stridor , thyroid nl, carotid no bruit Chest - symmetrical excursion , unlabored           Heart/CV- RRR , no murmur , no gallop  , no rub, nl s1 s2                           - JVD- none , edema- none, stasis changes- none, varices- none           Lung- + distant but clear, unlabored. no- cough, wheeze- none,  , dullness-none, rub- none           Chest wall-  Abd-  Br/ Gen/ Rectal- Not done, not indicated Extrem- cyanosis- none, clubbing, none, atrophy- none, strength- nl Neuro- grossly intact to observation

## 2012-11-27 NOTE — Patient Instructions (Addendum)
Order- lab a1AT   Dx emphysema  The best thing you can do for your breathing is to maintain your stamina by keeping moving with regular exercise. It doesn't need to be exhausting.  Pace yourself.  Please call as needed  Script for 3 month refills for ipratropium

## 2012-11-29 DIAGNOSIS — J300 Vasomotor rhinitis: Secondary | ICD-10-CM | POA: Insufficient documentation

## 2012-11-29 NOTE — Assessment & Plan Note (Signed)
Plan- ipratropium nasal spray as needed

## 2012-11-29 NOTE — Assessment & Plan Note (Signed)
Severe emphysema. He is doing a good job of maintaining exercise tolerance. We discussed availability of pulmonary rehabilitation but is doing most of what that would offer on his own already. Plan-continue regular exercise. Check alpha-1 antitrypsin level

## 2012-12-02 ENCOUNTER — Telehealth: Payer: Self-pay | Admitting: Internal Medicine

## 2012-12-02 MED ORDER — AMOXICILLIN-POT CLAVULANATE 875-125 MG PO TABS: 1.0000 | ORAL_TABLET | Freq: Two times a day (BID) | ORAL | Status: DC

## 2012-12-02 NOTE — Telephone Encounter (Signed)
Pt c/o a prod cough with yellow mucus x 2-3 days and PND. {t says he has been on the [place] for approx 2 months and doesn't know if this is related in any way. The pt denies any chest tightness, wheezing or sob. He does report that his breathing has improved since starting [place] and has been rinsing after each use. CY, pls advise or give recs.No Known Allergies

## 2012-12-02 NOTE — Telephone Encounter (Signed)
Per CY-okay to give Augmentin 875 mg #14 take 1 po bid no refills.  

## 2012-12-02 NOTE — Telephone Encounter (Signed)
Spoke with pt and notified of recs per CDY Pt verbalized understanding and denied any questions Rx was sent to pharm

## 2012-12-04 LAB — ALPHA-1 ANTITRYPSIN PHENOTYPE: A-1 Antitrypsin: 142 mg/dL (ref 83–199)

## 2012-12-08 NOTE — Progress Notes (Signed)
Quick Note:  Pt aware of results. ______ 

## 2012-12-25 ENCOUNTER — Telehealth: Payer: Self-pay | Admitting: Internal Medicine

## 2012-12-25 MED ORDER — FLUTICASONE-SALMETEROL 230-21 MCG/ACT IN AERO: 2.0000 | INHALATION_SPRAY | Freq: Two times a day (BID) | RESPIRATORY_TRACT | Status: DC

## 2012-12-25 NOTE — Telephone Encounter (Signed)
Spoke with patient, patient states he went out of town and has now come back and it slipped his mind to reorder his Adviar. Pt is completely out at this time Requesting 90 day supply sent to [facility]. Rx sent and nothing further needed at this time.

## 2013-03-04 ENCOUNTER — Encounter (INDEPENDENT_AMBULATORY_CARE_PROVIDER_SITE_OTHER): Payer: Self-pay | Admitting: General Surgery

## 2013-03-04 ENCOUNTER — Ambulatory Visit (INDEPENDENT_AMBULATORY_CARE_PROVIDER_SITE_OTHER): Payer: Medicare Other | Admitting: General Surgery

## 2013-03-04 VITALS — BP 122/84 | HR 59 | Temp 97.9°F | Resp 18 | Ht 71.0 in | Wt 186.2 lb

## 2013-03-04 DIAGNOSIS — K4020 Bilateral inguinal hernia, without obstruction or gangrene, not specified as recurrent: Secondary | ICD-10-CM | POA: Insufficient documentation

## 2013-03-04 NOTE — Patient Instructions (Signed)
Plan for bilateral inguinal hernia repair with mesh 

## 2013-03-04 NOTE — Progress Notes (Signed)
Subjective:     Patient ID: Luis [name], male   DOB: 09/15/1934, 79 y.o.   MRN: [id number]  HPI We're asked to see the patient in consultation by Dr. Shah to evaluate him for bilateral inguinal hernias. The patient is a 79-year-old white male who has been experiencing lower abdominal pain for the last 2 months. The pain has been in both groins. The pain occurs about every 3 days. He denies any nausea or vomiting. His bowels are moving regularly. He does have significant COPD but does not need oxygen at home.  Review of Systems  Constitutional: Negative.   HENT: Negative.   Eyes: Negative.   Respiratory: Negative.   Cardiovascular: Negative.   Gastrointestinal: Positive for abdominal pain.  Endocrine: Negative.   Genitourinary: Negative.   Musculoskeletal: Negative.   Skin: Negative.   Allergic/Immunologic: Negative.   Neurological: Negative.   Hematological: Negative.   Psychiatric/Behavioral: Negative.        Objective:   Physical Exam  Constitutional: He is oriented to person, place, and time. He appears well-developed and well-nourished.  HENT:  Head: Normocephalic and atraumatic.  Eyes: Conjunctivae and EOM are normal. Pupils are equal, round, and reactive to light.  Neck: Normal range of motion. Neck supple.  Cardiovascular: Normal rate, regular rhythm and normal heart sounds.   Pulmonary/Chest: Effort normal and breath sounds normal.  Abdominal: Soft. Bowel sounds are normal.  Genitourinary:  There is bulging in both groins that reduces with palpation.  Musculoskeletal: Normal range of motion.  Neurological: He is alert and oriented to person, place, and time.  Skin: Skin is warm and dry.  Psychiatric: He has a normal mood and affect. His behavior is normal.       Assessment:     The patient appears to have bilateral symptomatic inguinal hernias. Because of the risk of incarceration and strangulation I think he would benefit from having this fixed. He would also  like to have this done. I have discussed with him in detail the risks and benefits of the operation to fix the hernias as well as some of the technical aspects including the use of mesh and the risk of injury to the testicular artery and vas deferens and he understands and wishes to proceed. He has seen his pulmonologist recently and seems to be in pretty good shape.     Plan:     Plan for bilateral inguinal hernia repairs with mesh. We will plan to keep him overnight to watch his breathing       

## 2013-03-05 ENCOUNTER — Encounter (INDEPENDENT_AMBULATORY_CARE_PROVIDER_SITE_OTHER): Payer: Self-pay

## 2013-03-05 ENCOUNTER — Encounter (HOSPITAL_COMMUNITY): Payer: Self-pay | Admitting: Pharmacy Technician

## 2013-03-10 ENCOUNTER — Encounter (HOSPITAL_COMMUNITY)
Admission: RE | Admit: 2013-03-10 | Discharge: 2013-03-10 | Disposition: A | Discharge status: Home / Self Care | Payer: Medicare Other | Source: Ambulatory Visit | Attending: General Surgery | Admitting: General Surgery

## 2013-03-10 ENCOUNTER — Encounter (HOSPITAL_COMMUNITY): Payer: Self-pay

## 2013-03-10 HISTORY — DX: Gastro-esophageal reflux disease without esophagitis: K21.9

## 2013-03-10 HISTORY — DX: Cardiac murmur, unspecified: R01.1

## 2013-03-10 LAB — BASIC METABOLIC PANEL
CO2: 32 mEq/L (ref 19–32)
Calcium: 9.7 mg/dL (ref 8.4–10.5)
Chloride: 87 mEq/L — ABNORMAL LOW (ref 96–112)
GFR calc Af Amer: >90 mL/min (ref >90)
Sodium: 128 mEq/L — ABNORMAL LOW (ref 135–145)

## 2013-03-10 LAB — CBC
MCV: 88.8 fL (ref 78.0–100.0)
Platelets: 238 10*3/uL (ref 150–400)
RBC: 4.64 MIL/uL (ref 4.22–5.81)
RDW: 12.8 % (ref 11.5–15.5)
WBC: 9.1 10*3/uL (ref 4.0–10.5)

## 2013-03-10 LAB — SURGICAL PCR SCREEN
MRSA, PCR: NEGATIVE
Staphylococcus aureus: NEGATIVE

## 2013-03-10 NOTE — Pre-Procedure Instructions (Addendum)
Luis [name]  03/10/2013   Your procedure is scheduled on:  Thursday, June 5th.  Report to [facility] at 11:00AM.  Enter the hospital through the Main Entrance, go to the [facility] and take it to 3rd floor, Short Stay.  Call this number if you have problems the morning of surgery: [phone]   Remember:   Do not eat food or drink liquids after midnight.   Take these medicines the morning of surgery with A SIP OF WATER: amLODipine (NORVASC),omeprazole (PRILOSEC).  Use inhalers and nasal spray.  Bring ipratropium (ATROVENT) inhaler to the hospital with you.  Take if needed:nitroGLYCERIN (NITROSTAT),HYDROcodone-acetaminophen (NORCO/VICODIN).  Stop taking Aspirin, Coumadin, Plavix, Effient and Herbal medications.  Do not take any NSAIDs ie: Ibuprofen,  Advil,Naproxen or any medication containing Aspirin    Do not wear jewelry, make-up or nail polish.  Do not wear lotions, powders, or perfumes. You may wear deodorant.  Do not shave 48 hours prior to surgery. Men may shave face and neck.  Do not bring valuables to the hospital.  [facility] is not responsible  for any belongings or valuables.  Contacts, dentures or bridgework may not be worn into surgery.  Leave suitcase in the car. After surgery it may be brought to your room.  For patients admitted to the hospital, checkout time is 11:00 AM the day of discharge.   Patients discharged the day of surgery will not be allowed to drive home.  Name and phone number of your driver:    Special Instructions: Shower using CHG 2 nights before surgery and the night before surgery.  If you shower the day of surgery use CHG.  Use special wash - you have one bottle of CHG for all showers.  You should use approximately 1/3 of the bottle for each shower.   Please read over the following fact sheets that you were given: Pain Booklet, Coughing and Deep Breathing and Surgical Site Infection Prevention

## 2013-03-11 MED ORDER — CHLORHEXIDINE GLUCONATE 4 % EX LIQD: 1.0000 "application " | Freq: Once | CUTANEOUS | Status: DC

## 2013-03-11 MED ORDER — CEFAZOLIN SODIUM-DEXTROSE 2-3 GM-% IV SOLR
2.0000 g | INTRAVENOUS | Status: AC
  Administered 2013-03-12: 2 g via INTRAVENOUS
  Filled 2013-03-11: qty 50

## 2013-03-12 ENCOUNTER — Encounter (HOSPITAL_COMMUNITY): Payer: Self-pay

## 2013-03-12 ENCOUNTER — Encounter (HOSPITAL_COMMUNITY): Payer: Self-pay | Admitting: Anesthesiology

## 2013-03-12 ENCOUNTER — Ambulatory Visit (HOSPITAL_COMMUNITY): Payer: Medicare Other | Admitting: Anesthesiology

## 2013-03-12 ENCOUNTER — Encounter (HOSPITAL_COMMUNITY)
Admission: RE | Disposition: A | Discharge status: Home / Self Care | Payer: Self-pay | Source: Ambulatory Visit | Attending: General Surgery

## 2013-03-12 ENCOUNTER — Observation Stay (HOSPITAL_COMMUNITY)
Admission: RE | Admit: 2013-03-12 | Discharge: 2013-03-13 | Disposition: A | Discharge status: Home / Self Care | Payer: Medicare Other | Source: Ambulatory Visit | Attending: General Surgery | Admitting: General Surgery

## 2013-03-12 DIAGNOSIS — K4020 Bilateral inguinal hernia, without obstruction or gangrene, not specified as recurrent: Secondary | ICD-10-CM

## 2013-03-12 DIAGNOSIS — Z79899 Other long term (current) drug therapy: Secondary | ICD-10-CM | POA: Insufficient documentation

## 2013-03-12 DIAGNOSIS — Z01810 Encounter for preprocedural cardiovascular examination: Secondary | ICD-10-CM | POA: Insufficient documentation

## 2013-03-12 DIAGNOSIS — J4489 Other specified chronic obstructive pulmonary disease: Secondary | ICD-10-CM | POA: Insufficient documentation

## 2013-03-12 DIAGNOSIS — J449 Chronic obstructive pulmonary disease, unspecified: Secondary | ICD-10-CM | POA: Insufficient documentation

## 2013-03-12 DIAGNOSIS — Z01812 Encounter for preprocedural laboratory examination: Secondary | ICD-10-CM | POA: Insufficient documentation

## 2013-03-12 DIAGNOSIS — K4021 Bilateral inguinal hernia, without obstruction or gangrene, recurrent: Principal | ICD-10-CM | POA: Insufficient documentation

## 2013-03-12 HISTORY — PX: INGUINAL HERNIA REPAIR: SHX194

## 2013-03-12 HISTORY — PX: INGUINAL HERNIA REPAIR: SUR1180

## 2013-03-12 HISTORY — PX: INSERTION OF MESH: SHX5868

## 2013-03-12 SURGERY — REPAIR, HERNIA, INGUINAL, BILATERAL, ADULT
Anesthesia: General | Laterality: Bilateral | Wound class: Clean

## 2013-03-12 MED ORDER — GLYCOPYRROLATE 0.2 MG/ML IJ SOLN
INTRAMUSCULAR | Status: DC | PRN
  Administered 2013-03-12: 0.4 mg via INTRAVENOUS

## 2013-03-12 MED ORDER — POTASSIUM PHOSPHATE MONOBASIC 500 MG PO TABS: 500.0000 mg | ORAL_TABLET | Freq: Every day | ORAL | Status: DC

## 2013-03-12 MED ORDER — BUPIVACAINE-EPINEPHRINE 0.25% -1:200000 IJ SOLN
INTRAMUSCULAR | Status: DC | PRN
  Administered 2013-03-12: 28 mL
  Administered 2013-03-12: 18 mL

## 2013-03-12 MED ORDER — ASPIRIN 81 MG PO TABS: 81.0000 mg | ORAL_TABLET | Freq: Every day | ORAL | Status: DC

## 2013-03-12 MED ORDER — VITAMIN D3 25 MCG (1000 UNIT) PO TABS
1000.0000 [IU] | ORAL_TABLET | Freq: Every day | ORAL | Status: DC
  Filled 2013-03-12: qty 1

## 2013-03-12 MED ORDER — SIMVASTATIN 20 MG PO TABS
20.0000 mg | ORAL_TABLET | Freq: Every day | ORAL | Status: DC
  Filled 2013-03-12: qty 1

## 2013-03-12 MED ORDER — MOMETASONE FURO-FORMOTEROL FUM 200-5 MCG/ACT IN AERO
2.0000 | INHALATION_SPRAY | Freq: Two times a day (BID) | RESPIRATORY_TRACT | Status: DC
  Administered 2013-03-12 – 2013-03-13 (×2): 2 via RESPIRATORY_TRACT
  Filled 2013-03-12: qty 8.8

## 2013-03-12 MED ORDER — PROPOFOL 10 MG/ML IV BOLUS
INTRAVENOUS | Status: DC | PRN
  Administered 2013-03-12: 170 mg via INTRAVENOUS

## 2013-03-12 MED ORDER — ACLIDINIUM BROMIDE 400 MCG/ACT IN AEPB: 1.0000 | INHALATION_SPRAY | Freq: Two times a day (BID) | RESPIRATORY_TRACT | Status: DC

## 2013-03-12 MED ORDER — HYDROCODONE-ACETAMINOPHEN 5-325 MG PO TABS: 1.0000 | ORAL_TABLET | Freq: Four times a day (QID) | ORAL | Status: DC | PRN

## 2013-03-12 MED ORDER — ONDANSETRON HCL 4 MG/2ML IJ SOLN
INTRAMUSCULAR | Status: DC | PRN
  Administered 2013-03-12: 4 mg via INTRAVENOUS

## 2013-03-12 MED ORDER — ONDANSETRON HCL 4 MG PO TABS: 4.0000 mg | ORAL_TABLET | Freq: Four times a day (QID) | ORAL | Status: DC | PRN

## 2013-03-12 MED ORDER — FLUTICASONE PROPIONATE 50 MCG/ACT NA SUSP
2.0000 | Freq: Every day | NASAL | Status: DC | PRN
  Filled 2013-03-12: qty 16

## 2013-03-12 MED ORDER — BUPIVACAINE-EPINEPHRINE PF 0.25-1:200000 % IJ SOLN
INTRAMUSCULAR | Status: AC
  Filled 2013-03-12: qty 60

## 2013-03-12 MED ORDER — LACTATED RINGERS IV SOLN
INTRAVENOUS | Status: DC | PRN
  Administered 2013-03-12 (×2): via INTRAVENOUS

## 2013-03-12 MED ORDER — POTASSIUM PHOSPHATE MONOBASIC 500 MG PO TABS
500.0000 mg | ORAL_TABLET | Freq: Every day | ORAL | Status: DC
  Administered 2013-03-13: 500 mg via ORAL
  Filled 2013-03-12 (×2): qty 1

## 2013-03-12 MED ORDER — KCL IN DEXTROSE-NACL 20-5-0.9 MEQ/L-%-% IV SOLN
INTRAVENOUS | Status: DC
  Administered 2013-03-12: 20:00:00 via INTRAVENOUS
  Filled 2013-03-12: qty 1000

## 2013-03-12 MED ORDER — MIDAZOLAM HCL 5 MG/5ML IJ SOLN
INTRAMUSCULAR | Status: DC | PRN
  Administered 2013-03-12: 1 mg via INTRAVENOUS

## 2013-03-12 MED ORDER — POTASSIUM PHOSPHATE MONOBASIC 500 MG PO TABS
500.0000 mg | ORAL_TABLET | Freq: Every day | ORAL | Status: DC
  Filled 2013-03-12: qty 1

## 2013-03-12 MED ORDER — 0.9 % SODIUM CHLORIDE (POUR BTL) OPTIME
TOPICAL | Status: DC | PRN
  Administered 2013-03-12: 1000 mL

## 2013-03-12 MED ORDER — ONDANSETRON HCL 4 MG/2ML IJ SOLN: 4.0000 mg | Freq: Once | INTRAMUSCULAR | Status: DC | PRN

## 2013-03-12 MED ORDER — HYDROMORPHONE HCL PF 1 MG/ML IJ SOLN: 0.2500 mg | INTRAMUSCULAR | Status: DC | PRN

## 2013-03-12 MED ORDER — ROCURONIUM BROMIDE 100 MG/10ML IV SOLN
INTRAVENOUS | Status: DC | PRN
  Administered 2013-03-12: 40 mg via INTRAVENOUS

## 2013-03-12 MED ORDER — INDAPAMIDE 2.5 MG PO TABS
2.5000 mg | ORAL_TABLET | Freq: Every day | ORAL | Status: DC
Start: 2013-03-13 — End: 2013-03-13
  Filled 2013-03-12: qty 1

## 2013-03-12 MED ORDER — ACETAMINOPHEN 10 MG/ML IV SOLN: 1000.0000 mg | Freq: Once | INTRAVENOUS | Status: DC | PRN

## 2013-03-12 MED ORDER — ASPIRIN EC 81 MG PO TBEC
81.0000 mg | DELAYED_RELEASE_TABLET | Freq: Every day | ORAL | Status: DC
  Filled 2013-03-12: qty 1

## 2013-03-12 MED ORDER — ONDANSETRON HCL 4 MG/2ML IJ SOLN: 4.0000 mg | Freq: Four times a day (QID) | INTRAMUSCULAR | Status: DC | PRN

## 2013-03-12 MED ORDER — B COMPLEX-C PO TABS
1.0000 | ORAL_TABLET | Freq: Every day | ORAL | Status: DC
  Administered 2013-03-12: 1 via ORAL
  Filled 2013-03-12 (×3): qty 1

## 2013-03-12 MED ORDER — AMLODIPINE BESYLATE 5 MG PO TABS
5.0000 mg | ORAL_TABLET | Freq: Every day | ORAL | Status: DC
  Filled 2013-03-12: qty 1

## 2013-03-12 MED ORDER — IPRATROPIUM BROMIDE 0.06 % NA SOLN
1.0000 | Freq: Four times a day (QID) | NASAL | Status: DC
  Filled 2013-03-12: qty 15

## 2013-03-12 MED ORDER — MORPHINE SULFATE 4 MG/ML IJ SOLN: 4.0000 mg | INTRAMUSCULAR | Status: DC | PRN

## 2013-03-12 MED ORDER — PANTOPRAZOLE SODIUM 40 MG PO TBEC
40.0000 mg | DELAYED_RELEASE_TABLET | Freq: Every day | ORAL | Status: DC
  Administered 2013-03-12: 40 mg via ORAL
  Filled 2013-03-12: qty 1

## 2013-03-12 MED ORDER — POLYETHYLENE GLYCOL 3350 17 G PO PACK
17.0000 g | PACK | Freq: Every day | ORAL | Status: DC
  Administered 2013-03-12: 17 g via ORAL
  Filled 2013-03-12 (×2): qty 1

## 2013-03-12 MED ORDER — FENTANYL CITRATE 0.05 MG/ML IJ SOLN
INTRAMUSCULAR | Status: DC | PRN
  Administered 2013-03-12: 100 ug via INTRAVENOUS
  Administered 2013-03-12: 50 ug via INTRAVENOUS
  Administered 2013-03-12: 100 ug via INTRAVENOUS

## 2013-03-12 MED ORDER — CHOLECALCIFEROL 125 MCG (5000 UT) PO CAPS
5000.0000 [IU] | ORAL_CAPSULE | Freq: Every day | ORAL | Status: DC
Start: 2013-03-12 — End: 2013-03-12

## 2013-03-12 MED ORDER — NITROGLYCERIN 0.4 MG SL SUBL: 0.4000 mg | SUBLINGUAL_TABLET | SUBLINGUAL | Status: DC | PRN

## 2013-03-12 MED ORDER — LACTATED RINGERS IV SOLN
INTRAVENOUS | Status: DC
  Administered 2013-03-12: 12:00:00 via INTRAVENOUS

## 2013-03-12 MED ORDER — NEOSTIGMINE METHYLSULFATE 1 MG/ML IJ SOLN
INTRAMUSCULAR | Status: DC | PRN
  Administered 2013-03-12: 3 mg via INTRAVENOUS

## 2013-03-12 MED ORDER — LIDOCAINE HCL (CARDIAC) 20 MG/ML IV SOLN
INTRAVENOUS | Status: DC | PRN
  Administered 2013-03-12: 50 mg via INTRAVENOUS

## 2013-03-12 SURGICAL SUPPLY — 45 items
BLADE SURG 10 STRL SS (BLADE) ×2 IMPLANT
BLADE SURG 15 STRL LF DISP TIS (BLADE) ×1 IMPLANT
BLADE SURG 15 STRL SS (BLADE) ×1
BLADE SURG ROTATE 9660 (MISCELLANEOUS) ×2 IMPLANT
CHLORAPREP W/TINT 26ML (MISCELLANEOUS) ×2 IMPLANT
CLOTH BEACON ORANGE TIMEOUT ST (SAFETY) ×2 IMPLANT
COVER SURGICAL LIGHT HANDLE (MISCELLANEOUS) ×2 IMPLANT
DECANTER SPIKE VIAL GLASS SM (MISCELLANEOUS) ×2 IMPLANT
DERMABOND ADVANCED (GAUZE/BANDAGES/DRESSINGS) ×1
DERMABOND ADVANCED .7 DNX12 (GAUZE/BANDAGES/DRESSINGS) ×1 IMPLANT
DRAIN PENROSE 1/2X12 LTX STRL (WOUND CARE) ×2 IMPLANT
DRAPE LAPAROSCOPIC ABDOMINAL (DRAPES) ×2 IMPLANT
DRAPE UTILITY 15X26 W/TAPE STR (DRAPE) ×4 IMPLANT
ELECT CAUTERY BLADE 6.4 (BLADE) ×2 IMPLANT
ELECT REM PT RETURN 9FT ADLT (ELECTROSURGICAL) ×2
ELECTRODE REM PT RTRN 9FT ADLT (ELECTROSURGICAL) ×1 IMPLANT
GLOVE BIO SURGEON STRL SZ 6.5 (GLOVE) ×2 IMPLANT
GLOVE BIO SURGEON STRL SZ7.5 (GLOVE) ×4 IMPLANT
GLOVE BIOGEL PI IND STRL 7.0 (GLOVE) ×2 IMPLANT
GLOVE BIOGEL PI INDICATOR 7.0 (GLOVE) ×2
GOWN STRL NON-REIN LRG LVL3 (GOWN DISPOSABLE) ×6 IMPLANT
KIT BASIN OR (CUSTOM PROCEDURE TRAY) ×2 IMPLANT
KIT ROOM TURNOVER OR (KITS) ×2 IMPLANT
MESH ULTRAPRO 6X6 15CM15CM (Mesh General) ×2 IMPLANT
NEEDLE HYPO 25GX1X1/2 BEV (NEEDLE) ×2 IMPLANT
NS IRRIG 1000ML POUR BTL (IV SOLUTION) ×2 IMPLANT
PACK SURGICAL SETUP 50X90 (CUSTOM PROCEDURE TRAY) ×2 IMPLANT
PAD ARMBOARD 7.5X6 YLW CONV (MISCELLANEOUS) ×2 IMPLANT
PENCIL BUTTON HOLSTER BLD 10FT (ELECTRODE) ×2 IMPLANT
SPONGE LAP 18X18 X RAY DECT (DISPOSABLE) ×4 IMPLANT
SUT MNCRL AB 4-0 PS2 18 (SUTURE) ×4 IMPLANT
SUT PROLENE 2 0 SH DA (SUTURE) ×8 IMPLANT
SUT SILK 2 0 SH (SUTURE) IMPLANT
SUT SILK 3 0 (SUTURE) ×2
SUT SILK 3-0 18XBRD TIE 12 (SUTURE) ×2 IMPLANT
SUT VIC AB 0 CT1 27 (SUTURE) ×2
SUT VIC AB 0 CT1 27XBRD ANBCTR (SUTURE) ×2 IMPLANT
SUT VIC AB 2-0 SH 27 (SUTURE) ×2
SUT VIC AB 2-0 SH 27X BRD (SUTURE) ×2 IMPLANT
SUT VIC AB 3-0 SH 27 (SUTURE) ×2
SUT VIC AB 3-0 SH 27XBRD (SUTURE) ×2 IMPLANT
SYR BULB 3OZ (MISCELLANEOUS) ×2 IMPLANT
SYR CONTROL 10ML LL (SYRINGE) ×2 IMPLANT
TOWEL OR 17X24 6PK STRL BLUE (TOWEL DISPOSABLE) ×2 IMPLANT
TOWEL OR 17X26 10 PK STRL BLUE (TOWEL DISPOSABLE) ×2 IMPLANT

## 2013-03-12 NOTE — H&P (View-Only) (Signed)
Subjective:     Patient ID: Luis [name], male   DOB: 1934-09-03, 79 y.o.   MRN: [id number]  HPI We're asked to see the patient in consultation by Dr. Sherryll Burger to evaluate him for bilateral inguinal hernias. The patient is a 79 year old white male who has been experiencing lower abdominal pain for the last 2 months. The pain has been in both groins. The pain occurs about every 3 days. He denies any nausea or vomiting. His bowels are moving regularly. He does have significant COPD but does not need oxygen at home.  Review of Systems  Constitutional: Negative.   HENT: Negative.   Eyes: Negative.   Respiratory: Negative.   Cardiovascular: Negative.   Gastrointestinal: Positive for abdominal pain.  Endocrine: Negative.   Genitourinary: Negative.   Musculoskeletal: Negative.   Skin: Negative.   Allergic/Immunologic: Negative.   Neurological: Negative.   Hematological: Negative.   Psychiatric/Behavioral: Negative.        Objective:   Physical Exam  Constitutional: He is oriented to person, place, and time. He appears well-developed and well-nourished.  HENT:  Head: Normocephalic and atraumatic.  Eyes: Conjunctivae and EOM are normal. Pupils are equal, round, and reactive to light.  Neck: Normal range of motion. Neck supple.  Cardiovascular: Normal rate, regular rhythm and normal heart sounds.   Pulmonary/Chest: Effort normal and breath sounds normal.  Abdominal: Soft. Bowel sounds are normal.  Genitourinary:  There is bulging in both groins that reduces with palpation.  Musculoskeletal: Normal range of motion.  Neurological: He is alert and oriented to person, place, and time.  Skin: Skin is warm and dry.  Psychiatric: He has a normal mood and affect. His behavior is normal.       Assessment:     The patient appears to have bilateral symptomatic inguinal hernias. Because of the risk of incarceration and strangulation I think he would benefit from having this fixed. He would also  like to have this done. I have discussed with him in detail the risks and benefits of the operation to fix the hernias as well as some of the technical aspects including the use of mesh and the risk of injury to the testicular artery and vas deferens and he understands and wishes to proceed. He has seen his pulmonologist recently and seems to be in pretty good shape.     Plan:     Plan for bilateral inguinal hernia repairs with mesh. We will plan to keep him overnight to watch his breathing

## 2013-03-12 NOTE — Transfer of Care (Signed)
Immediate Anesthesia Transfer of Care Note  Patient: Luis [name]  Procedure(s) Performed: Procedure(s): HERNIA REPAIR INGUINAL ADULT BILATERAL (Bilateral) INSERTION OF MESH (Bilateral)  Patient Location: PACU  Anesthesia Type:General  Level of Consciousness: awake, alert , oriented and patient cooperative  Airway & Oxygen Therapy: Patient Spontanous Breathing and Patient connected to nasal cannula oxygen  Post-op Assessment: Report given to PACU RN, Post -op Vital signs reviewed and stable and Patient moving all extremities  Post vital signs: Reviewed and stable  Complications: No apparent anesthesia complications

## 2013-03-12 NOTE — Op Note (Signed)
03/12/2013  3:47 PM  PATIENT:  Luis [name]  79 y.o. male  PRE-OPERATIVE DIAGNOSIS:  bilateral inguinal hernia  POST-OPERATIVE DIAGNOSIS:  bilateral inguinal hernia  PROCEDURE:  Procedure(s): HERNIA REPAIR INGUINAL ADULT BILATERAL (Bilateral) INSERTION OF MESH (Bilateral)  SURGEON:  Surgeon(s) and Role:    * Robyne Askew, MD - Primary  PHYSICIAN ASSISTANT:   ASSISTANTS: none   ANESTHESIA:   general  EBL:  Total I/O In: 1400 [I.V.:1400] Out: -   BLOOD ADMINISTERED:none  DRAINS: none   LOCAL MEDICATIONS USED:  MARCAINE     SPECIMEN:  No Specimen  DISPOSITION OF SPECIMEN:  N/A  COUNTS:  YES  TOURNIQUET:  * No tourniquets in log *  DICTATION: .Dragon Dictation After informed consent was obtained the patient was brought to the operating room and placed in the supine position on the operating room table. After adequate induction of general anesthesia the patient's abdomen and bilateral groin area were prepped with ChloraPrep, allowed to dry, and draped in usual sterile manner. Attention was first turned to the right groin. This area was infiltrated with quarter percent Marcaine. An incision was made from the edge of the pubic tubercle on the right towards the anterior superior iliac spine. This incision was carried through the skin and subcutaneous tissue sharply with the electrocautery until the fascia of the external oblique was encountered. The fascia was opened along its fibers towards the apex of the external ring. There was a significant amount of scar tissue in this area secondary to a previous hernia repair. I was able to dissect bluntly in this plane with a hemostat and identify the cord structures. I believe we were also able to identify the inguinal ligament. Medially in the canal there was a defect in the floor with a small bulge. The hernia was reduced easily and the defect was repaired with interrupted 0 Vicryl stitches. A 6 x 6 piece of ultra Pro mesh was chosen  and cut in half. The 3 x 6 portion was then cut to fit in the canal. The mesh was sewed in through a to the shelving edge of the inguinal ligament with an running 2-0 Prolene stitch. Tails were cut in the mesh laterally and the tails were wrapped around the cord structures. Superiorly the mesh was sewed to the musculoaponeurotic strength layer of the transversalis with interrupted 2-0 Prolene vertical mattress stitches. Lateral to the cord the tails of the mesh were anchored to the shelving edge and inguinal ligament with interrupted 2-0 Prolene stitches. Once this was accomplished the hernia appeared to be well repaired and the mesh was in good position. The ilioinguinal nerve was not seen during this dissection. The wound was irrigated with copious amounts of saline. The external oblique fascia was then reapproximated with a running 2-0 Vicryl stitch. The wound was then infiltrated with 1/4% Marcaine. The subcutaneous fascia was closed with a running 3-0 Vicryl stitch. The skin was then reapproximated with a running 4-0 Monocryl subcuticular stitch. Attention was then turned to the left groin. The left groin was infiltrated with quarter percent Marcaine. An incision was made from the edge of the pubic tubercle towards the anterior superior iliac spine. This incision was carried through the skin and subcutaneous tissue sharply with electrocautery until the fascia of the external oblique was encountered. The fascia of the external oblique was opened along its fibers towards the apex of the external ring. Blunt dissection was carried out of the cord structures until it could  be surrounded between 2 fingers. A half-inch Penrose drain was placed around the cord structures for retraction purposes. There was no hernia associated with the cord. Medially in the canal near the pubic tubercle there was a defect with a reducible bulge. This was reduced and the defect was closed with interrupted 0 Vicryl stitches. The other  piece of 3 x 6 mesh was cut to fit. The mesh was sewn inferiorly to the shelving edge of the inguinal ligament with a running 2-0 Prolene stitch. Tails were cut in the mesh laterally and the tails were wrapped around the cord structures. Superiorly the mesh was sewed to the musculoaponeurotic strength layer of the transversalis with 2-0 Prolene vertical mattress stitches. Lateral to the cord the tails of the mesh were anchored to the shelving edge of the inguinal ligament with interrupted 2-0 Prolene stitch. Again we looked for the ilioinguinal nerve but could not identify it. The wound was then irrigated with copious amounts of saline. At this point the hernia appeared to be well repaired and the mesh was in good position. The external oblique fascia was then reapproximated with a running 2-0 Vicryl stitch. The wound was infiltrated with more quarter percent Marcaine. The subcutaneous fascia was closed with a running 3-0 Vicryl stitch. The skin was then closed with an running 4-0 Monocryl subcuticular stitch. Dermabond dressings were then applied. The patient tolerated the procedure well. At the end of the case all needle sponge and instrument counts were correct. The patient was then awakened and taken to recovery in stable condition. The patient's testicles were in the scrotum at the end of the case.  PLAN OF CARE: Admit for overnight observation  PATIENT DISPOSITION:  PACU - hemodynamically stable.   Delay start of Pharmacological VTE agent (>24hrs) due to surgical blood loss or risk of bleeding: not applicable

## 2013-03-12 NOTE — Interval H&P Note (Signed)
History and Physical Interval Note:  03/12/2013 1:05 PM  Luis [name]  has presented today for surgery, with the diagnosis of bilateral inguinal hernia  The various methods of treatment have been discussed with the patient and family. After consideration of risks, benefits and other options for treatment, the patient has consented to  Procedure(s): HERNIA REPAIR INGUINAL ADULT BILATERAL (Bilateral) INSERTION OF MESH (Bilateral) as a surgical intervention .  The patient's history has been reviewed, patient examined, no change in status, stable for surgery.  I have reviewed the patient's chart and labs.  Questions were answered to the patient's satisfaction.     TOTH III,PAUL S

## 2013-03-12 NOTE — Anesthesia Postprocedure Evaluation (Signed)
Anesthesia Post Note  Patient: Luis [name]  Procedure(s) Performed: Procedure(s) (LRB): HERNIA REPAIR INGUINAL ADULT BILATERAL (Bilateral) INSERTION OF MESH (Bilateral)  Anesthesia type: general  Patient location: PACU  Post pain: Pain level controlled  Post assessment: Patient's Cardiovascular Status Stable  Last Vitals:  Filed Vitals:   03/12/13 1615  BP: 127/61  Pulse: 58  Temp:   Resp: 12    Post vital signs: Reviewed and stable  Level of consciousness: sedated  Complications: No apparent anesthesia complications

## 2013-03-12 NOTE — Anesthesia Preprocedure Evaluation (Signed)
Anesthesia Evaluation  Patient identified by MRN, date of birth, ID band Patient awake    Reviewed: Allergy & Precautions, H&P , NPO status , Patient's Chart, lab work & pertinent test results  Airway Mallampati: II      Dental  (+) Teeth Intact and Dental Advisory Given   Pulmonary  breath sounds clear to auscultation        Cardiovascular Rhythm:Regular Rate:Normal     Neuro/Psych    GI/Hepatic   Endo/Other    Renal/GU      Musculoskeletal   Abdominal   Peds  Hematology   Anesthesia Other Findings   Reproductive/Obstetrics                           Anesthesia Physical Anesthesia Plan  ASA: III  Anesthesia Plan: General   Post-op Pain Management:    Induction: Intravenous  Airway Management Planned: Oral ETT  Additional Equipment:   Intra-op Plan:   Post-operative Plan: Extubation in OR  Informed Consent: I have reviewed the patients History and Physical, chart, labs and discussed the procedure including the risks, benefits and alternatives for the proposed anesthesia with the patient or authorized representative who has indicated his/her understanding and acceptance.   Dental advisory given  Plan Discussed with: CRNA, Surgeon and Anesthesiologist  Anesthesia Plan Comments: (Bilateral inguinal hernia Htn COPD  Plan GA with oral ETT  Kipp Brood, MD)        Anesthesia Quick Evaluation

## 2013-03-13 ENCOUNTER — Encounter (HOSPITAL_COMMUNITY): Payer: Self-pay | Admitting: General Surgery

## 2013-03-13 NOTE — Discharge Planning (Signed)
Copy of home instructions to pt. Who verbalizes understanding. Ate 100 % of breakfast, tol well, voiding without difficulty.  Will dc to private car home with all personal belongings acomp. By wife.

## 2013-03-13 NOTE — Discharge Summary (Signed)
Physician Discharge Summary  Patient ID: Luis [name] MRN: [id number] DOB/AGE: 77-Jan-1935 77 y.o.  Admit date: 03/12/2013 Discharge date: 03/13/2013  Admission Diagnoses:  Discharge Diagnoses:  Active Problems:   * No active hospital problems. *   Discharged Condition: good  Hospital Course: the pt underwent bilateral inguinal hernia repairs with mesh. Because of his COPD he was kept overnight for observation. He did well and is ready for discharge home  Consults: None  Significant Diagnostic Studies: none  Treatments: surgery: as above  Discharge Exam: Blood pressure 104/54, pulse 53, temperature 98.3 F (36.8 C), temperature source Oral, resp. rate 18, height 5\' 11"  (1.803 m), weight 186 lb (84.369 kg), SpO2 95.00%. Resp: clear to auscultation bilaterally GI: soft, nontender. incisions look good  Disposition:   Discharge Orders   Future Appointments Provider Department Dept Phone   03/30/2013 1:40 PM Robyne Askew, MD [facility], [place] [phone]   05/28/2013 10:45 AM Waymon Budge, MD [facility] [phone]   10/12/2013 12:30 PM Sherrie George, MD [facility] [phone]   Future Orders Complete By Expires     Call MD for:  difficulty breathing, headache or visual disturbances  As directed     Call MD for:  extreme fatigue  As directed     Call MD for:  hives  As directed     Call MD for:  persistant dizziness or light-headedness  As directed     Call MD for:  persistant nausea and vomiting  As directed     Call MD for:  redness, tenderness, or signs of infection (pain, swelling, redness, odor or green/yellow discharge around incision site)  As directed     Call MD for:  severe uncontrolled pain  As directed     Call MD for:  temperature >100.4  As directed     Diet - low sodium heart healthy  As directed     Discharge instructions  As directed     Comments:      May shower. Do not lift more than 5lbs for next 6  weeks. Use colace and miralax to avoid constipation    Increase activity slowly  As directed     No wound care  As directed         Medication List    TAKE these medications       aspirin 81 MG tablet  Take 81 mg by mouth daily.     B-complex with vitamin C tablet  Take 1 tablet by mouth daily.     D3 MAXIMUM STRENGTH 5000 UNITS capsule  Generic drug:  Cholecalciferol  Take 5,000 Units by mouth daily.     fish oil-omega-3 fatty acids 1000 MG capsule  Take 2 g by mouth daily. 1400 mg daily.     fluticasone 50 MCG/ACT nasal spray  Commonly known as:  FLONASE  Place 2 sprays into the nose daily as needed for allergies.     fluticasone-salmeterol 230-21 MCG/ACT inhaler  Commonly known as:  ADVAIR HFA  Inhale 2 puffs into the lungs 2 (two) times daily.     HYDROcodone-acetaminophen 5-325 MG per tablet  Commonly known as:  NORCO/VICODIN  Take 1 tablet by mouth every 6 (six) hours as needed for pain.     indapamide 2.5 MG tablet  Commonly known as:  LOZOL  Take 2.5 mg by mouth every morning.     ipratropium 0.06 % nasal spray  Commonly known  as:  ATROVENT  Place 1 spray into the nose 4 (four) times daily. 1 or 2 puffs in each nostril, up to 4 x daily as needed     multivitamin tablet  Take 1 tablet by mouth daily.     nitroGLYCERIN 0.4 MG SL tablet  Commonly known as:  NITROSTAT  Place 0.4 mg under the tongue every 5 (five) minutes as needed for chest pain.     NORVASC 5 MG tablet  Generic drug:  amLODipine  Take 5 mg by mouth daily.     omeprazole 20 MG capsule  Commonly known as:  PRILOSEC  Take 20 mg by mouth daily.     polyethylene glycol packet  Commonly known as:  MIRALAX / GLYCOLAX  Take 17 g by mouth daily.     potassium phosphate (monobasic) 500 MG tablet  Commonly known as:  K-PHOS ORIGINAL  Take 500 mg by mouth daily.     simvastatin 20 MG tablet  Commonly known as:  ZOCOR  Take 20 mg by mouth daily. 1/2 tablet daily.     TUDORZA PRESSAIR 400  MCG/ACT Aepb  Generic drug:  Aclidinium Bromide  Inhale 1 Inhaler into the lungs 2 (two) times daily.         Signed: TOTH III,PAUL S 03/13/2013, 8:09 AM

## 2013-03-13 NOTE — Progress Notes (Signed)
1 Day Post-Op  Subjective: No complaints  Objective: Vital signs in last 24 hours: Temp:  [97.7 F (36.5 C)-98.3 F (36.8 C)] 98.3 F (36.8 C) (06/06 0603) Pulse Rate:  [53-62] 53 (06/06 0603) Resp:  [12-20] 18 (06/06 0603) BP: (103-131)/(54-72) 104/54 mmHg (06/06 0603) SpO2:  [94 %-100 %] 95 % (06/06 0603) Weight:  [186 lb (84.369 kg)] 186 lb (84.369 kg) (06/05 2307) Last BM Date: 03/11/13  Intake/Output from previous day: 06/05 0701 - 06/06 0700 In: 2204 [P.O.:480; I.V.:1724] Out: 1100 [Urine:1100] Intake/Output this shift:    Resp: clear to auscultation bilaterally GI: soft, nontender. incisions look good  Lab Results:   Recent Labs  03/10/13 1344  WBC 9.1  HGB 14.8  HCT 41.2  PLT 238   BMET  Recent Labs  03/10/13 1344  NA 128*  K 3.1*  CL 87*  CO2 32  GLUCOSE 145*  BUN 17  CREATININE 0.84  CALCIUM 9.7   PT/INR No results found for this basename: LABPROT, INR,  in the last 72 hours ABG No results found for this basename: PHART, PCO2, PO2, HCO3,  in the last 72 hours  Studies/Results: No results found.  Anti-infectives: Anti-infectives   Start     Dose/Rate Route Frequency Ordered Stop   03/12/13 0600  ceFAZolin (ANCEF) IVPB 2 g/50 mL premix     2 g 100 mL/hr over 30 Minutes Intravenous On call to O.R. 03/11/13 1408 03/12/13 1350      Assessment/Plan: s/p Procedure(s): HERNIA REPAIR INGUINAL ADULT BILATERAL (Bilateral) INSERTION OF MESH (Bilateral) Advance diet Discharge  LOS: 1 day    TOTH III,Akyra Bouchie S 03/13/2013

## 2013-03-30 ENCOUNTER — Encounter (INDEPENDENT_AMBULATORY_CARE_PROVIDER_SITE_OTHER): Payer: Self-pay | Admitting: General Surgery

## 2013-03-30 ENCOUNTER — Ambulatory Visit (INDEPENDENT_AMBULATORY_CARE_PROVIDER_SITE_OTHER): Payer: Medicare Other | Admitting: General Surgery

## 2013-03-30 VITALS — BP 110/60 | HR 78 | Resp 16 | Ht 70.5 in | Wt 184.4 lb

## 2013-03-30 DIAGNOSIS — K4020 Bilateral inguinal hernia, without obstruction or gangrene, not specified as recurrent: Secondary | ICD-10-CM

## 2013-03-30 NOTE — Patient Instructions (Signed)
No heavy lifting 

## 2013-03-30 NOTE — Progress Notes (Signed)
Subjective:     Patient ID: Luis [name], male   DOB: 1933-11-17, 79 y.o.   MRN: [id number]  HPI The patient is a 79 year old white male who is 2 weeks status post bilateral inguinal hernia repairs with mesh. He has done very well since surgery and denies any abdominal or groin pain. His appetite is good and his bowels are working normally.  Review of Systems     Objective:   Physical Exam On exam both of his incisions are healing nicely with no sign of infection or significant seroma. His abdominal wall feels very solid. There is no palpable evidence for recurrence of the hernia    Assessment:     The patient is 2 weeks status post bilateral inguinal hernia repairs with mesh     Plan:     At this point I would like him to continue to refrain from any heavy lifting. We will plan to see him back in one month to check his progress

## 2013-04-27 ENCOUNTER — Encounter (INDEPENDENT_AMBULATORY_CARE_PROVIDER_SITE_OTHER): Payer: Self-pay | Admitting: General Surgery

## 2013-04-27 ENCOUNTER — Ambulatory Visit (INDEPENDENT_AMBULATORY_CARE_PROVIDER_SITE_OTHER): Payer: Medicare Other | Admitting: General Surgery

## 2013-04-27 VITALS — BP 110/64 | HR 66 | Temp 98.2°F | Resp 15 | Ht 70.5 in | Wt 183.6 lb

## 2013-04-27 DIAGNOSIS — K4020 Bilateral inguinal hernia, without obstruction or gangrene, not specified as recurrent: Secondary | ICD-10-CM

## 2013-04-27 NOTE — Patient Instructions (Signed)
May return to all normal activity

## 2013-04-27 NOTE — Progress Notes (Signed)
Subjective:     Patient ID: Luis [name], male   DOB: 11-19-1933, 79 y.o.   MRN: [id number]  HPI The patient is a 79 year old white male who is about 6 weeks status post bilateral inguinal hernia repairs with mesh. He is doing her well and has no complaints of pain. His appetite has been good and his bowels are working normally.  Review of Systems     Objective:   Physical Exam On exam both incisions are healing nicely with no sign of infection or seroma. There is no palpable evidence for recurrence of the hernia    Assessment:     The patient is 6 weeks status post bilateral inguinal hernia repairs with mesh     Plan:     At this point he may return to all mobile activities without any restrictions. We will plan to see him back on a when necessary basis

## 2013-05-13 ENCOUNTER — Other Ambulatory Visit: Payer: Self-pay

## 2013-05-28 ENCOUNTER — Ambulatory Visit: Payer: Medicare Other | Admitting: Internal Medicine

## 2013-06-02 ENCOUNTER — Ambulatory Visit (INDEPENDENT_AMBULATORY_CARE_PROVIDER_SITE_OTHER): Payer: Medicare Other | Admitting: Internal Medicine

## 2013-06-02 ENCOUNTER — Encounter: Payer: Self-pay | Admitting: Internal Medicine

## 2013-06-02 VITALS — BP 108/60 | HR 66 | Ht 70.5 in | Wt 189.2 lb

## 2013-06-02 DIAGNOSIS — J438 Other emphysema: Secondary | ICD-10-CM

## 2013-06-02 DIAGNOSIS — J439 Emphysema, unspecified: Secondary | ICD-10-CM

## 2013-06-02 NOTE — Progress Notes (Signed)
10/27/12- 79 yo M former smoker- Pt referred by Dr Sherryll Burger. pt states having sob upon heavy activity. Wife is a patient of mine. He had smoked one to 2 packs per day for 35 years, ending in 4. 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 He was diagnosed with COPD/emphysema by pulmonologist Dr. Orson Aloe from [place]. Shortness of breath with exertion for years is getting gradually worse. Cough occasionally productive of clear mucus with no blood. He can climb one flight of stairs without stopping- he's breathing hard at the top. No chest pain, palpitation or swelling. He has not noticed dramatic benefit from tudorza, albuterol HFA,, Advair 250. He also complains of anosmia for at least several years. He blames this on working with ammonia fumes. He is using Flonase and Atrovent nasal sprays Father died of DVT/PE mother died of stroke. He doesn't know of any family members with lung disease.  // needs a1AT//  11/27/12- 78 yoM former smoker followed for COPD       PCP Dr Kirstie Peri FOLLOWS FOR: reivew PFT and with patient; still having SOB with heavy activity. Wife here. No change in no acute event since last here. Persistent dyspnea with exertion but he does 30 minutes daily at 3 miles per hour on treadmill. Likes ipratropium nasal spray. Has had pneumonia vaccine after age 69. CXR 10/28/12 IMPRESSION:  COPD. No active lung disease.  Original Report Authenticated By: Dwyane Dee, M.D. PFT2/5/14- severe obstructive airways disease with slight response to bronchodilator. Air-trapping with increased residual volume. Diffusion mildly reduced. Emphysema pattern. FVC 2.56/59%, FEV1 1.17/41%, FEV1/FVC 0.45. Residual volume 132%, DLCO 78%. 6MWT-11/12/12- 96%, 94%, 96%, 454 m. Good distance with oxygenation well maintained.  06/02/13-79 yoM former smoker followed for COPD       PCP Dr Kirstie Peri     Wife here FOLLOWS FOR: feels like he may be a  little worse than last visit with breathing; SOB with activity. Has questions/concerns with Tudorza-has cough and doesnt feel as well while taking it. Dyspnea on exertion is not improved. Has cough, productive clear phlegm.  Urinary syncope episode. a1AT- WNL MM 142  ROS-see HPI Constitutional:   No-   weight loss, night sweats, fevers, chills, fatigue, lassitude. HEENT:   No-  headaches, difficulty swallowing, tooth/dental problems, sore throat,       No-  sneezing, itching, ear ache, nasal congestion, post nasal drip,  CV:  No-   chest pain, orthopnea, PND, swelling in lower extremities, anasarca, dizziness, palpitations Resp: + shortness of breath with exertion or at rest.              + productive cough,  No non-productive cough,  No- coughing up of blood.              No-   change in color of mucus.  No- wheezing.   Skin: No-   rash or lesions. GI:  No-   heartburn, indigestion, abdominal pain, nausea, vomiting,  GU:  MS:  No-   joint pain or swelling.   Neuro-     +Urinary syncope Psych:  No- change in mood or affect. No depression or anxiety.  No memory loss.  OBJ- Physical Exam General- Alert, Oriented, Affect-appropriate, Distress- none acute. Medium build. Skin- rash-none, lesions- none, excoriation- none Lymphadenopathy- none Head- atraumatic            Eyes- Gross vision intact, PERRLA, conjunctivae and secretions clear  Ears- Hearing, canals-normal            Nose- Clear, no-Septal dev, mucus, polyps, erosion, perforation             Throat- Mallampati II , mucosa clear , drainage- none, tonsils- atrophic. Dental repair. Neck- flexible , trachea midline, no stridor , thyroid nl, carotid no bruit Chest - symmetrical excursion , unlabored           Heart/CV- +IRR/ occ PAC? , no murmur , no gallop  , no rub, nl s1 s2    108/60                           - JVD- none , edema- none, stasis changes- none, varices- none           Lung- + distant but clear, unlabored. no-  cough, wheeze- none,  , dullness-none, rub- none           Chest wall-  Abd-  Br/ Gen/ Rectal- Not done, not indicated Extrem- cyanosis- none, clubbing, none, atrophy- none, strength- nl Neuro- grossly intact to observation

## 2013-06-02 NOTE — Patient Instructions (Addendum)
Discuss with Dr Sherryll Burger the possibility that your fainting happens at times when your blood pressure gets too low.  Stop Viviana Simpler to try [facility] as an alternative equivalent to Advair HFA. We can prescribe Dulera if you decide you like it better  Don't forget flu shot this Fall  Please call as needed

## 2013-06-14 NOTE — Assessment & Plan Note (Addendum)
He dislikes [place]. Plan- compare Advair to [facility]. See if it is the powder he dislikes.

## 2013-08-13 ENCOUNTER — Other Ambulatory Visit: Payer: Self-pay

## 2013-09-02 ENCOUNTER — Other Ambulatory Visit: Payer: Self-pay | Admitting: Dermatology

## 2013-10-12 ENCOUNTER — Ambulatory Visit (INDEPENDENT_AMBULATORY_CARE_PROVIDER_SITE_OTHER): Payer: Medicare Other | Admitting: Ophthalmology

## 2013-10-23 ENCOUNTER — Ambulatory Visit (INDEPENDENT_AMBULATORY_CARE_PROVIDER_SITE_OTHER): Payer: Medicare Other | Admitting: Ophthalmology

## 2013-10-23 DIAGNOSIS — H33009 Unspecified retinal detachment with retinal break, unspecified eye: Secondary | ICD-10-CM

## 2013-10-23 DIAGNOSIS — H2510 Age-related nuclear cataract, unspecified eye: Secondary | ICD-10-CM

## 2013-10-23 DIAGNOSIS — H43819 Vitreous degeneration, unspecified eye: Secondary | ICD-10-CM

## 2013-10-23 DIAGNOSIS — H35349 Macular cyst, hole, or pseudohole, unspecified eye: Secondary | ICD-10-CM

## 2013-10-23 DIAGNOSIS — H33309 Unspecified retinal break, unspecified eye: Secondary | ICD-10-CM

## 2013-11-02 ENCOUNTER — Telehealth: Payer: Self-pay | Admitting: Internal Medicine

## 2013-11-02 NOTE — Telephone Encounter (Signed)
I spoke with pt. He received a letter from [facility]. They will not cover the tudorza unless he tries and fails spiriva. Pt reports he has tried spiriva a couple years ago and did not help him. He reports the [place] has been helping him w/ his breathing.  The phone # is [phone] ID# [phone]   ATC on hold x 6 min wcb

## 2013-11-05 NOTE — Telephone Encounter (Signed)
I called to initiate PA over the phone. Information given and medication has been approved through 10-07-14.  Pt and pharmacy is aware. Domino Bing, CMA

## 2013-12-02 ENCOUNTER — Encounter: Payer: Self-pay | Admitting: Internal Medicine

## 2013-12-02 ENCOUNTER — Ambulatory Visit (INDEPENDENT_AMBULATORY_CARE_PROVIDER_SITE_OTHER)
Admission: RE | Admit: 2013-12-02 | Discharge: 2013-12-02 | Disposition: A | Discharge status: Home / Self Care | Payer: Medicare Other | Source: Ambulatory Visit | Attending: Internal Medicine | Admitting: Internal Medicine

## 2013-12-02 ENCOUNTER — Ambulatory Visit (INDEPENDENT_AMBULATORY_CARE_PROVIDER_SITE_OTHER): Payer: Medicare Other | Admitting: Internal Medicine

## 2013-12-02 VITALS — BP 122/68 | HR 61 | Ht 70.5 in | Wt 187.6 lb

## 2013-12-02 DIAGNOSIS — J439 Emphysema, unspecified: Secondary | ICD-10-CM

## 2013-12-02 DIAGNOSIS — J309 Allergic rhinitis, unspecified: Secondary | ICD-10-CM

## 2013-12-02 DIAGNOSIS — J300 Vasomotor rhinitis: Secondary | ICD-10-CM

## 2013-12-02 DIAGNOSIS — J438 Other emphysema: Secondary | ICD-10-CM

## 2013-12-02 NOTE — Progress Notes (Signed)
10/27/12- 80 yo M former smoker- Pt referred by Dr Manuella Ghazi. pt states having sob upon heavy activity. Wife is a patient of mine. He had smoked one to 2 packs per day for 35 years, ending in 31. 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 He was diagnosed with COPD/emphysema by pulmonologist Dr. Koleen Nimrod from [place]. Shortness of breath with exertion for years is getting gradually worse. Cough occasionally productive of clear mucus with no blood. He can climb one flight of stairs without stopping- he's breathing hard at the top. No chest pain, palpitation or swelling. He has not noticed dramatic benefit from tudorza, albuterol HFA,, Advair 250. He also complains of anosmia for at least several years. He blames this on working with ammonia fumes. He is using Flonase and Atrovent nasal sprays Father died of DVT/PE mother died of stroke. He doesn't know of any family members with lung disease.  // needs a1AT//  11/27/12- 78 yoM former smoker followed for COPD       PCP Dr Monico Blitz FOLLOWS FOR: reivew PFT and 6MW with patient; still having SOB with heavy activity. Wife here. No change in no acute event since last here. Persistent dyspnea with exertion but he does 30 minutes daily at 3 miles per hour on treadmill. Likes ipratropium nasal spray. Has had pneumonia vaccine after age 50. CXR 10/28/12 IMPRESSION:  COPD. No active lung disease.  Original Report Authenticated By: Ivar Drape, M.D. PFT2/5/14- severe obstructive airways disease with slight response to bronchodilator. Air-trapping with increased residual volume. Diffusion mildly reduced. Emphysema pattern. FVC 2.56/59%, FEV1 1.17/41%, FEV1/FVC 0.45. Residual volume 132%, DLCO 78%. 6MWT-11/12/12- 96%, 94%, 96%, 454 m. Good distance with oxygenation well maintained.  06/02/13-79 yoM former smoker followed for COPD       PCP Dr Monico Blitz     Wife here FOLLOWS FOR: feels like he may be a  little worse than last visit with breathing; SOB with activity. Has questions/concerns with Tudorza-has cough and doesnt feel as well while taking it. Dyspnea on exertion is not improved. Has cough, productive clear phlegm.  Urinary syncope episode. a1AT- WNL MM 142  12/02/13- 79 yoM former smoker followed for COPD       PCP Dr Monico Blitz     Wife here FOLLOWS FOR: Pt states his breathing is not any better since last OV.  SOB with activity as well. Dyspnea with climbing 2 flights of stairs and has to stop to get his breath before going on. This is unchanged. Some cough with white phlegm. Denies chest pain, palpitations or edema. Uses treadmill and step exerciser some.  ROS-see HPI Constitutional:   No-   weight loss, night sweats, fevers, chills, fatigue, lassitude. HEENT:   No-  headaches, difficulty swallowing, tooth/dental problems, sore throat,       No-  sneezing, itching, ear ache, nasal congestion, post nasal drip,  CV:  No-   chest pain, orthopnea, PND, swelling in lower extremities, anasarca, dizziness, palpitations Resp: + shortness of breath with exertion or at rest.              + productive cough,  No non-productive cough,  No- coughing up of blood.              No-   change in color of mucus.  No- wheezing.   Skin: No-   rash or lesions. GI:  No-   heartburn, indigestion, abdominal pain, nausea,  vomiting,  GU:  MS:  No-   joint pain or swelling.   Neuro-     +Urinary syncope Psych:  No- change in mood or affect. No depression or anxiety.  No memory loss.  OBJ- Physical Exam General- Alert, Oriented, Affect-appropriate, Distress- none acute. Medium build. Skin- rash-none, lesions- none, excoriation- none Lymphadenopathy- none Head- atraumatic            Eyes- Gross vision intact, PERRLA, conjunctivae and secretions clear            Ears- Hearing, canals-normal            Nose- Clear, no-Septal dev, mucus, polyps, erosion, perforation             Throat- Mallampati II ,  mucosa clear , drainage- none, tonsils- atrophic. Dental repair. Neck- flexible , trachea midline, no stridor , thyroid nl, carotid no bruit Chest - symmetrical excursion , unlabored           Heart/CV- +IRR/ occ PAC? , no murmur , no gallop  , no rub, nl s1 s2                               - JVD- none , edema- none, stasis changes- none, varices- none           Lung- + distant but clear, unlabored. no- cough, wheeze- none, dullness-none, rub- none           Chest wall-  Abd-  Br/ Gen/ Rectal- Not done, not indicated Extrem- cyanosis- none, clubbing, none, atrophy- none, strength- nl Neuro- grossly intact to observation

## 2013-12-02 NOTE — Patient Instructions (Signed)
Order CXR dx COPD  Consider asking at [facility] if they have a Pulmonary Rehab program you might participate in. You could ask the hospital operator and if she doesn't know, then ask for the cardiopulmonary department.

## 2013-12-03 ENCOUNTER — Ambulatory Visit: Payer: Medicare Other | Admitting: Internal Medicine

## 2013-12-27 NOTE — Assessment & Plan Note (Signed)
Watching impact as spring pollen begins

## 2013-12-27 NOTE — Assessment & Plan Note (Addendum)
Controlled without acute exacerbation. No change exercise tolerance. I am glad he is still trying to exercise. Suggested pulmonary rehabilitation.

## 2014-01-21 ENCOUNTER — Telehealth: Payer: Self-pay | Admitting: Internal Medicine

## 2014-01-21 NOTE — Telephone Encounter (Signed)
Spoke with pt. He reports his SOB is getting worse x 3 weeks. He can be resting and is fine but very little activity he reports breathing is terrible. NO wheezing, no chest tx. Wants an appt to be seen. Please advise Dr. Annamaria Boots thanks  No Known Allergies   Current Outpatient Prescriptions on File Prior to Visit  Medication Sig Dispense Refill  . Aclidinium Bromide (TUDORZA PRESSAIR) 400 MCG/ACT AEPB Inhale 1 Inhaler into the lungs 2 (two) times daily.       [name] aspirin 81 MG tablet Take 81 mg by mouth daily.      . B Complex-C (B-COMPLEX WITH VITAMIN C) tablet Take 1 tablet by mouth daily.      . Cholecalciferol (D3 MAXIMUM STRENGTH) 5000 UNITS capsule Take 5,000 Units by mouth daily.      . fish oil-omega-3 fatty acids 1000 MG capsule Take 2 g by mouth daily. 1400 mg daily.      . fluticasone-salmeterol (ADVAIR HFA) 230-21 MCG/ACT inhaler Inhale 2 puffs into the lungs 2 (two) times daily.  3 Inhaler  3  . indapamide (LOZOL) 2.5 MG tablet Take 2.5 mg by mouth every morning.      [name] ipratropium (ATROVENT) 0.06 % nasal spray Place 1 spray into the nose 4 (four) times daily. 1 or 2 puffs in each nostril, up to 4 x daily as needed      . Multiple Vitamin (MULTIVITAMIN) tablet Take 1 tablet by mouth daily.      . nitroGLYCERIN (NITROSTAT) 0.4 MG SL tablet Place 0.4 mg under the tongue every 5 (five) minutes as needed for chest pain.       [name] omeprazole (PRILOSEC) 20 MG capsule Take 20 mg by mouth daily.      . polyethylene glycol (MIRALAX / GLYCOLAX) packet Take 17 g by mouth daily.      . potassium phosphate, monobasic, (K-PHOS ORIGINAL) 500 MG tablet Take 500 mg by mouth daily.       . simvastatin (ZOCOR) 20 MG tablet Take 20 mg by mouth daily. 1/2 tablet daily.       No current facility-administered medications on file prior to visit.

## 2014-01-21 NOTE — Telephone Encounter (Signed)
Pt scheduled with TP 01/22/14 at 215.

## 2014-01-21 NOTE — Telephone Encounter (Signed)
Per CY-lets have patient come in to see TP. She has openings tomorrow. Thanks.

## 2014-01-22 ENCOUNTER — Ambulatory Visit (INDEPENDENT_AMBULATORY_CARE_PROVIDER_SITE_OTHER): Payer: Medicare Other | Admitting: Adult Health

## 2014-01-22 ENCOUNTER — Encounter: Payer: Self-pay | Admitting: Adult Health

## 2014-01-22 VITALS — BP 126/72 | HR 65 | Temp 98.6°F | Ht 71.0 in | Wt 191.0 lb

## 2014-01-22 DIAGNOSIS — J438 Other emphysema: Secondary | ICD-10-CM

## 2014-01-22 DIAGNOSIS — J439 Emphysema, unspecified: Secondary | ICD-10-CM

## 2014-01-22 NOTE — Patient Instructions (Addendum)
Continue on Advair and Tudorza  Activity and Exercise as tolerated.  May purchase pulse oximeter to check Oxygen saturations , goal is >88%.  follow up Dr. Annamaria Boots  In 6 weeks to discuss if Oxygen is still needed.  Please contact office for sooner follow up if symptoms do not improve or worsen or seek emergency care

## 2014-01-25 NOTE — Assessment & Plan Note (Addendum)
Discussed COPD and disease progression  Wants to hold off on O2 for now.   Plan   Continue on Advair and Tudorza  Activity and Exercise as tolerated.  May purchase pulse oximeter to check Oxygen saturations , goal is >88%.  follow up Dr. Annamaria Boots  In 6 weeks to discuss if Oxygen is still needed.  Please contact office for sooner follow up if symptoms do not improve or worsen or seek emergency care

## 2014-01-25 NOTE — Progress Notes (Signed)
10/27/12- 80 yo M former smoker- Pt referred by Dr Manuella Ghazi. pt states having sob upon heavy activity. Wife is a patient of mine. He had smoked one to 2 packs per day for 35 years, ending in 71. 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 He was diagnosed with COPD/emphysema by pulmonologist Dr. Koleen Nimrod from [place]. Shortness of breath with exertion for years is getting gradually worse. Cough occasionally productive of clear mucus with no blood. He can climb one flight of stairs without stopping- he's breathing hard at the top. No chest pain, palpitation or swelling. He has not noticed dramatic benefit from tudorza, albuterol HFA,, Advair 250. He also complains of anosmia for at least several years. He blames this on working with ammonia fumes. He is using Flonase and Atrovent nasal sprays Father died of DVT/PE mother died of stroke. He doesn't know of any family members with lung disease.  // needs a1AT//  11/27/12- 80 yoM former smoker followed for COPD       PCP Dr Monico Blitz FOLLOWS FOR: reivew PFT and 6MW with patient; still having SOB with heavy activity. Wife here. No change in no acute event since last here. Persistent dyspnea with exertion but he does 30 minutes daily at 3 miles per hour on treadmill. Likes ipratropium nasal spray. Has had pneumonia vaccine after age 77. CXR 10/28/12 IMPRESSION:  COPD. No active lung disease.  Original Report Authenticated By: Ivar Drape, M.D. PFT2/5/14- severe obstructive airways disease with slight response to bronchodilator. Air-trapping with increased residual volume. Diffusion mildly reduced. Emphysema pattern. FVC 2.56/59%, FEV1 1.17/41%, FEV1/FVC 0.45. Residual volume 132%, DLCO 78%. 6MWT-11/12/12- 96%, 94%, 96%, 454 m. Good distance with oxygenation well maintained.  06/02/13-80 yoM former smoker followed for COPD       PCP Dr Monico Blitz     Wife here FOLLOWS FOR: feels like he may be a  little worse than last visit with breathing; SOB with activity. Has questions/concerns with Tudorza-has cough and doesnt feel as well while taking it. Dyspnea on exertion is not improved. Has cough, productive clear phlegm.  Urinary syncope episode. a1AT- WNL MM 142  12/02/13- 80 yoM former smoker followed for COPD       PCP Dr Monico Blitz     Wife here FOLLOWS FOR: Pt states his breathing is not any better since last OV.  SOB with activity as well. Dyspnea with climbing 2 flights of stairs and has to stop to get his breath before going on. This is unchanged. Some cough with white phlegm. Denies chest pain, palpitations or edema. Uses treadmill and step exerciser some.  01/22/14 Acute OV Complains of increased SOB x2 weeks with prod cough with yellow mucus.  But says it is clear to yellow at times. No fever. Denies any wheezing, chest tightness, hemoptysis, f/c/s, nausea, vomiting.   Sats were 89% upon entering exam room. After rest 93% on RA. We discussed oxygen and he wants to wait for this .  CXR w/ no acute process , stable COPD 12/02/13    ROS-see HPI Constitutional:   No  weight loss, night sweats,  Fevers, chills, fatigue, or  lassitude.  HEENT:   No headaches,  Difficulty swallowing,  Tooth/dental problems, or  Sore throat,                No sneezing, itching, ear ache, nasal congestion, post nasal drip,   CV:  No chest pain,  Orthopnea, PND, swelling in lower extremities, anasarca, dizziness, palpitations, syncope.   GI  No heartburn, indigestion, abdominal pain, nausea, vomiting, diarrhea, change in bowel habits, loss of appetite, bloody stools.   Resp:    No chest wall deformity  Skin: no rash or lesions.  GU: no dysuria, change in color of urine, no urgency or frequency.  No flank pain, no hematuria   MS:  No joint pain or swelling.  No decreased range of motion.  No back pain.  Psych:  No change in mood or affect. No depression or anxiety.  No memory loss.      OBJ-  Physical Exam GEN: A/Ox3; pleasant , NAD, eldelry   HEENT:  [place]/AT,  EACs-clear, TMs-wnl, NOSE-clear, THROAT-clear, no lesions, no postnasal drip or exudate noted.   NECK:  Supple w/ fair ROM; no JVD; normal carotid impulses w/o bruits; no thyromegaly or nodules palpated; no lymphadenopathy.  RESP  Diminished BS in bases,  no accessory muscle use, no dullness to percussion  CARD:  RRR, no m/r/g  , no peripheral edema, pulses intact, no cyanosis or clubbing.  GI:   Soft & nt; nml bowel sounds; no organomegaly or masses detected.  Musco: Warm bil, no deformities or joint swelling noted.   Neuro: alert, no focal deficits noted.    Skin: Warm, no lesions or rashes

## 2014-02-04 ENCOUNTER — Other Ambulatory Visit: Payer: Self-pay | Admitting: Internal Medicine

## 2014-03-04 ENCOUNTER — Ambulatory Visit (INDEPENDENT_AMBULATORY_CARE_PROVIDER_SITE_OTHER): Payer: Medicare Other | Admitting: Internal Medicine

## 2014-03-04 ENCOUNTER — Encounter: Payer: Self-pay | Admitting: Internal Medicine

## 2014-03-04 VITALS — BP 122/72 | HR 56 | Ht 71.0 in | Wt 187.6 lb

## 2014-03-04 DIAGNOSIS — J309 Allergic rhinitis, unspecified: Secondary | ICD-10-CM

## 2014-03-04 DIAGNOSIS — J438 Other emphysema: Secondary | ICD-10-CM

## 2014-03-04 DIAGNOSIS — J439 Emphysema, unspecified: Secondary | ICD-10-CM

## 2014-03-04 DIAGNOSIS — J300 Vasomotor rhinitis: Secondary | ICD-10-CM

## 2014-03-04 MED ORDER — THEOPHYLLINE ER 200 MG PO TB12: 200.0000 mg | ORAL_TABLET | Freq: Two times a day (BID) | ORAL | Status: DC

## 2014-03-04 NOTE — Progress Notes (Signed)
10/27/12- 80 yo M former smoker- Pt referred by Dr Manuella Ghazi. pt states having sob upon heavy activity. Wife is a patient of mine. He had smoked one to 2 packs per day for 35 years, ending in 89. 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 He was diagnosed with COPD/emphysema by pulmonologist Dr. Koleen Nimrod from [place]. Shortness of breath with exertion for years is getting gradually worse. Cough occasionally productive of clear mucus with no blood. He can climb one flight of stairs without stopping- he's breathing hard at the top. No chest pain, palpitation or swelling. He has not noticed dramatic benefit from tudorza, albuterol HFA,, Advair 250. He also complains of anosmia for at least several years. He blames this on working with ammonia fumes. He is using Flonase and Atrovent nasal sprays Father died of DVT/PE mother died of stroke. He doesn't know of any family members with lung disease.  // needs a1AT//  11/27/12- 78 yoM former smoker followed for COPD       PCP Dr Monico Blitz FOLLOWS FOR: reivew PFT and 6MW with patient; still having SOB with heavy activity. Wife here. No change in no acute event since last here. Persistent dyspnea with exertion but he does 30 minutes daily at 3 miles per hour on treadmill. Likes ipratropium nasal spray. Has had pneumonia vaccine after age 68. CXR 10/28/12 IMPRESSION:  COPD. No active lung disease.  Original Report Authenticated By: Ivar Drape, M.D. PFT2/5/14- severe obstructive airways disease with slight response to bronchodilator. Air-trapping with increased residual volume. Diffusion mildly reduced. Emphysema pattern. FVC 2.56/59%, FEV1 1.17/41%, FEV1/FVC 0.45. Residual volume 132%, DLCO 78%. 6MWT-11/12/12- 96%, 94%, 96%, 454 m. Good distance with oxygenation well maintained.  06/02/13-79 yoM former smoker followed for COPD       PCP Dr Monico Blitz     Wife here FOLLOWS FOR: feels like he may be a  little worse than last visit with breathing; SOB with activity. Has questions/concerns with Tudorza-has cough and doesnt feel as well while taking it. Dyspnea on exertion is not improved. Has cough, productive clear phlegm.  Urinary syncope episode. a1AT- WNL MM 142  12/02/13- 79 yoM former smoker followed for COPD       PCP Dr Monico Blitz     Wife here FOLLOWS FOR: Pt states his breathing is not any better since last OV.  SOB with activity as well. Dyspnea with climbing 2 flights of stairs and has to stop to get his breath before going on. This is unchanged. Some cough with white phlegm. Denies chest pain, palpitations or edema. Uses treadmill and step exerciser some.  01/22/14 Acute OV Complains of increased SOB x2 weeks with prod cough with yellow mucus.  But says it is clear to yellow at times. No fever. Denies any wheezing, chest tightness, hemoptysis, f/c/s, nausea, vomiting.   Sats were 89% upon entering exam room. After rest 93% on RA. We discussed oxygen and he wants to wait for this .  CXR w/ no acute process , stable COPD 12/02/13  03/04/14- 79 yoM former smoker followed for Severe COPD, vasomotor rhinitis       PCP Dr Monico Blitz     Wife here FOLLOWS FOR: Pt states breathing is unchanged. Pt c/o of SOB with exertion and intermittent productive cough with yellow mucous. Denied CP/tightness.  Dyspnea doing yard work, climbing hills. Comfortable at rest and with ADLs. Home oximetry usually 90%, rarely 85 on  room air. Says cardiac tests were negative in the past.  ROS-see HPI Constitutional:   No-   weight loss, night sweats, fevers, chills, fatigue, lassitude. HEENT:   No-  headaches, difficulty swallowing, tooth/dental problems, sore throat,       No-  sneezing, itching, ear ache, nasal congestion, post nasal drip,  CV:  No-   chest pain, orthopnea, PND, swelling in lower extremities, anasarca,                                                    dizziness, palpitations Resp: +shortness of  breath with exertion or at rest.              No-   productive cough,  No non-productive cough,  No- coughing up of blood.              No-   change in color of mucus.  No- wheezing.   Skin: No-   rash or lesions. GI:  No-   heartburn, indigestion, abdominal pain, nausea, vomiting, GU: . MS:  No-   joint pain or swelling.  . Neuro-     nothing unusual Psych:  No- change in mood or affect. No depression or anxiety.  No memory loss.  OBJ- Physical Exam General- Alert, Oriented, Affect-appropriate, Distress- none acute, trim Skin- rash-none, lesions- none, excoriation- none Lymphadenopathy- none Head- atraumatic            Eyes- Gross vision intact, PERRLA, conjunctivae and secretions clear            Ears- Hearing, canals-normal            Nose- Clear, no-Septal dev, mucus, polyps, erosion, perforation             Throat- Mallampati II , mucosa clear , drainage- none, tonsils- atrophic Neck- flexible , trachea midline, no stridor , thyroid nl, carotid no bruit Chest - symmetrical excursion , unlabored           Heart/CV- RRR , no murmur , no gallop  , no rub, nl s1 s2                           - JVD- none , edema- none, stasis changes- none, varices- none           Lung- clear to P&A, wheeze- none, cough- none , dullness-none, rub- none           Chest wall-  Abd-  Br/ Gen/ Rectal- Not done, not indicated Extrem- cyanosis- none, clubbing, none, atrophy- none, strength- nl Neuro- grossly intact to observation

## 2014-03-04 NOTE — Patient Instructions (Signed)
Script sent to try theophylline 200 mg, twice daily with meals. There is room to increase the dose later if needed.   Ok to try using your rescue albuterol inhaler BEFORE you start unusual exertion. See if that helps.  Please call as needed

## 2014-03-22 ENCOUNTER — Other Ambulatory Visit: Payer: Self-pay | Admitting: Internal Medicine

## 2014-05-06 ENCOUNTER — Telehealth: Payer: Self-pay | Admitting: Internal Medicine

## 2014-05-06 MED ORDER — THEOPHYLLINE ER 200 MG PO TB12: 200.0000 mg | ORAL_TABLET | Freq: Three times a day (TID) | ORAL | Status: DC

## 2014-05-06 NOTE — Telephone Encounter (Signed)
Per OV 03/04/14; Patient Instructions      Script sent to try theophylline 200 mg, twice daily with meals. There is room to increase the dose later if needed.  Ok to try using your rescue albuterol inhaler BEFORE you start unusual exertion. See if that helps. Please call as needed     Called spoke with pt. He reports he has not seen any difference since taking the theophylline. Wants to know if this needs to be increased? Please advise thanks

## 2014-05-06 NOTE — Telephone Encounter (Signed)
Refill this time. It will give him enough tabs to try the increased dose we outlined before.

## 2014-05-06 NOTE — Telephone Encounter (Signed)
Spoke with pt - given recs as below per CY Pt states that he only has 4 pills left - would like to know if Dr Annamaria Boots wants to proceed with this regimen or change to something else   Please advise. Thanks.

## 2014-05-06 NOTE — Telephone Encounter (Signed)
While he has some theophylline still, suggest he try taking 2oo mg 3 times daily for a week, then if needed, he can go up to a max of 400 mg, twice daily with food.

## 2014-05-06 NOTE — Telephone Encounter (Signed)
Pt aware of recs. RX called in. Nothing further needed 

## 2014-05-11 NOTE — Assessment & Plan Note (Addendum)
Controlled, symptomatic with dyspnea on exertion. No acute event. Plan-try adding theophylline, medication talk

## 2014-05-11 NOTE — Assessment & Plan Note (Signed)
Controlled but not improved

## 2014-05-12 ENCOUNTER — Other Ambulatory Visit: Payer: Self-pay | Admitting: Dermatology

## 2014-07-05 ENCOUNTER — Ambulatory Visit (INDEPENDENT_AMBULATORY_CARE_PROVIDER_SITE_OTHER): Payer: Medicare Other | Admitting: Internal Medicine

## 2014-07-05 ENCOUNTER — Encounter: Payer: Self-pay | Admitting: Internal Medicine

## 2014-07-05 VITALS — BP 110/70 | HR 66 | Ht 71.0 in | Wt 179.6 lb

## 2014-07-05 DIAGNOSIS — J432 Centrilobular emphysema: Secondary | ICD-10-CM

## 2014-07-05 DIAGNOSIS — J309 Allergic rhinitis, unspecified: Secondary | ICD-10-CM

## 2014-07-05 DIAGNOSIS — J300 Vasomotor rhinitis: Secondary | ICD-10-CM

## 2014-07-05 DIAGNOSIS — J438 Other emphysema: Secondary | ICD-10-CM

## 2014-07-05 NOTE — Progress Notes (Signed)
10/27/12- 80 yo M former smoker- Pt referred by Dr Manuella Ghazi. pt states having sob upon heavy activity. Wife is a patient of mine. He had smoked one to 2 packs per day for 35 years, ending in 100. 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 He was diagnosed with COPD/emphysema by pulmonologist Dr. Koleen Nimrod from [place]. Shortness of breath with exertion for years is getting gradually worse. Cough occasionally productive of clear mucus with no blood. He can climb one flight of stairs without stopping- he's breathing hard at the top. No chest pain, palpitation or swelling. He has not noticed dramatic benefit from tudorza, albuterol HFA,, Advair 250. He also complains of anosmia for at least several years. He blames this on working with ammonia fumes. He is using Flonase and Atrovent nasal sprays Father died of DVT/PE mother died of stroke. He doesn't know of any family members with lung disease.  // needs a1AT//  11/27/12- 78 yoM former smoker followed for COPD       PCP Dr Monico Blitz FOLLOWS FOR: reivew PFT and 6MW with patient; still having SOB with heavy activity. Wife here. No change in no acute event since last here. Persistent dyspnea with exertion but he does 30 minutes daily at 3 miles per hour on treadmill. Likes ipratropium nasal spray. Has had pneumonia vaccine after age 68. CXR 10/28/12 IMPRESSION:  COPD. No active lung disease.  Original Report Authenticated By: Ivar Drape, M.D. PFT2/5/14- severe obstructive airways disease with slight response to bronchodilator. Air-trapping with increased residual volume. Diffusion mildly reduced. Emphysema pattern. FVC 2.56/59%, FEV1 1.17/41%, FEV1/FVC 0.45. Residual volume 132%, DLCO 78%. 6MWT-11/12/12- 96%, 94%, 96%, 454 m. Good distance with oxygenation well maintained.  06/02/13-79 yoM former smoker followed for COPD       PCP Dr Monico Blitz     Wife here FOLLOWS FOR: feels like he may be a  little worse than last visit with breathing; SOB with activity. Has questions/concerns with Tudorza-has cough and doesnt feel as well while taking it. Dyspnea on exertion is not improved. Has cough, productive clear phlegm.  Urinary syncope episode. a1AT- WNL MM 142  12/02/13- 79 yoM former smoker followed for COPD       PCP Dr Monico Blitz     Wife here FOLLOWS FOR: Pt states his breathing is not any better since last OV.  SOB with activity as well. Dyspnea with climbing 2 flights of stairs and has to stop to get his breath before going on. This is unchanged. Some cough with white phlegm. Denies chest pain, palpitations or edema. Uses treadmill and step exerciser some.  01/22/14 Acute OV Complains of increased SOB x2 weeks with prod cough with yellow mucus.  But says it is clear to yellow at times. No fever. Denies any wheezing, chest tightness, hemoptysis, f/c/s, nausea, vomiting.   Sats were 89% upon entering exam room. After rest 93% on RA. We discussed oxygen and he wants to wait for this .  CXR w/ no acute process , stable COPD 12/02/13  03/04/14- 79 yoM former smoker followed for Severe COPD, vasomotor rhinitis       PCP Dr Monico Blitz     Wife here FOLLOWS FOR: Pt states breathing is unchanged. Pt c/o of SOB with exertion and intermittent productive cough with yellow mucous. Denied CP/tightness.  Dyspnea doing yard work, climbing hills. Comfortable at rest and with ADLs. Home oximetry usually 90%, rarely 85 on  room air. Says cardiac tests were negative in the past.  07/05/14- 80 yoM former smoker followed for Severe COPD, vasomotor rhinitis       PCP Dr Monico Blitz     Wife here Has had flu shot FOLLOWS FOR: patient stays congested(morning time); SOB or wheezing with activity. Patient has been losing weight-recently dx'd with DM-diet controlled.   Has had flu vax Some wheeze. Daily light cough productive clear. Less DOE. Continuing theodur, Advair [place], Tudorza.  ROS-see  HPI Constitutional:   No-   weight loss, night sweats, fevers, chills, fatigue, lassitude. HEENT:   No-  headaches, difficulty swallowing, tooth/dental problems, sore throat,       No-  sneezing, itching, ear ache, nasal congestion, post nasal drip,  CV:  No-   chest pain, orthopnea, PND, swelling in lower extremities, anasarca,                                                    dizziness, palpitations Resp: +shortness of breath with exertion or at rest.             +productive cough,  No non-productive cough,  No- coughing up of blood.              No-   change in color of mucus.  No- wheezing.   Skin: No-   rash or lesions. GI:  No-   heartburn, indigestion, abdominal pain, nausea, vomiting, GU: . MS:  No-   joint pain or swelling.  . Neuro-     nothing unusual Psych:  No- change in mood or affect. No depression or anxiety.  No memory loss.  OBJ- Physical Exam General- Alert, Oriented, Affect-appropriate, Distress- none acute, trim Skin- rash-none, lesions- none, excoriation- none Lymphadenopathy- none Head- atraumatic            Eyes- Gross vision intact, PERRLA, conjunctivae and secretions clear            Ears- Hearing, canals-normal            Nose- Clear, no-Septal dev, mucus, polyps, erosion, perforation             Throat- Mallampati II , mucosa clear , drainage- none, tonsils- atrophic Neck- flexible , trachea midline, no stridor , thyroid nl, carotid no bruit Chest - symmetrical excursion , unlabored           Heart/CV- RRR , no murmur , no gallop  , no rub, nl s1 s2                           - JVD- none , edema- none, stasis changes- none, varices- none           Lung- clear to P&A/ diminished, wheeze- none, cough- none , dullness-none, rub- none           Chest wall-  Abd-  Br/ Gen/ Rectal- Not done, not indicated Extrem- cyanosis- none, clubbing, none, atrophy- none, strength- nl Neuro- grossly intact to observation

## 2014-07-05 NOTE — Patient Instructions (Signed)
You can ask Dr Manuella Ghazi if you ave already had the Prevnar 13 pneumococcal vaccine. We can give it here if he doesn't provide that.  Continue present meds  Please call as needed

## 2014-07-06 NOTE — Assessment & Plan Note (Signed)
Controlled w/o acute change Plan- He will ask his PCP Dr Manuella Ghazi about pneumonia vaccine status. Would be a candidate for Prevnar once.

## 2014-07-06 NOTE — Assessment & Plan Note (Signed)
Controlled.  

## 2014-07-16 ENCOUNTER — Other Ambulatory Visit: Payer: Self-pay | Admitting: Internal Medicine

## 2014-08-30 ENCOUNTER — Other Ambulatory Visit: Payer: Self-pay | Admitting: Internal Medicine

## 2014-10-25 ENCOUNTER — Ambulatory Visit (INDEPENDENT_AMBULATORY_CARE_PROVIDER_SITE_OTHER): Payer: Medicare Other | Admitting: Ophthalmology

## 2014-10-25 DIAGNOSIS — I10 Essential (primary) hypertension: Secondary | ICD-10-CM

## 2014-10-25 DIAGNOSIS — H35033 Hypertensive retinopathy, bilateral: Secondary | ICD-10-CM

## 2014-10-25 DIAGNOSIS — H43813 Vitreous degeneration, bilateral: Secondary | ICD-10-CM

## 2014-10-25 DIAGNOSIS — E11319 Type 2 diabetes mellitus with unspecified diabetic retinopathy without macular edema: Secondary | ICD-10-CM

## 2014-10-25 DIAGNOSIS — H338 Other retinal detachments: Secondary | ICD-10-CM

## 2014-10-25 DIAGNOSIS — H35341 Macular cyst, hole, or pseudohole, right eye: Secondary | ICD-10-CM

## 2014-10-25 DIAGNOSIS — H2512 Age-related nuclear cataract, left eye: Secondary | ICD-10-CM

## 2014-10-25 DIAGNOSIS — E11329 Type 2 diabetes mellitus with mild nonproliferative diabetic retinopathy without macular edema: Secondary | ICD-10-CM

## 2014-12-02 ENCOUNTER — Ambulatory Visit: Payer: Medicare Other | Admitting: Internal Medicine

## 2015-01-03 ENCOUNTER — Ambulatory Visit (INDEPENDENT_AMBULATORY_CARE_PROVIDER_SITE_OTHER)
Admission: RE | Admit: 2015-01-03 | Discharge: 2015-01-03 | Disposition: A | Discharge status: Home / Self Care | Payer: Medicare Other | Source: Ambulatory Visit | Attending: Internal Medicine | Admitting: Internal Medicine

## 2015-01-03 ENCOUNTER — Encounter: Payer: Self-pay | Admitting: Internal Medicine

## 2015-01-03 ENCOUNTER — Ambulatory Visit (INDEPENDENT_AMBULATORY_CARE_PROVIDER_SITE_OTHER): Payer: Medicare Other | Admitting: Internal Medicine

## 2015-01-03 VITALS — BP 114/60 | HR 59 | Ht 71.0 in | Wt 176.6 lb

## 2015-01-03 DIAGNOSIS — J432 Centrilobular emphysema: Secondary | ICD-10-CM | POA: Diagnosis not present

## 2015-01-03 MED ORDER — AEROCHAMBER MV MISC: Status: DC

## 2015-01-03 NOTE — Progress Notes (Signed)
10/27/12- 81 yo M former smoker- Pt referred by Dr Manuella Ghazi. pt states having sob upon heavy activity. Wife is a patient of mine. He had smoked one to 2 packs per day for 35 years, ending in 100. 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 He was diagnosed with COPD/emphysema by pulmonologist Dr. Koleen Nimrod from [place]. Shortness of breath with exertion for years is getting gradually worse. Cough occasionally productive of clear mucus with no blood. He can climb one flight of stairs without stopping- he's breathing hard at the top. No chest pain, palpitation or swelling. He has not noticed dramatic benefit from tudorza, albuterol HFA,, Advair 250. He also complains of anosmia for at least several years. He blames this on working with ammonia fumes. He is using Flonase and Atrovent nasal sprays Father died of DVT/PE mother died of stroke. He doesn't know of any family members with lung disease.  // needs a1AT//  11/27/12- 78 yoM former smoker followed for COPD       PCP Dr Monico Blitz FOLLOWS FOR: reivew PFT and 6MW with patient; still having SOB with heavy activity. Wife here. No change in no acute event since last here. Persistent dyspnea with exertion but he does 30 minutes daily at 3 miles per hour on treadmill. Likes ipratropium nasal spray. Has had pneumonia vaccine after age 68. CXR 10/28/12 IMPRESSION:  COPD. No active lung disease.  Original Report Authenticated By: Ivar Drape, M.D. PFT2/5/14- severe obstructive airways disease with slight response to bronchodilator. Air-trapping with increased residual volume. Diffusion mildly reduced. Emphysema pattern. FVC 2.56/59%, FEV1 1.17/41%, FEV1/FVC 0.45. Residual volume 132%, DLCO 78%. 6MWT-11/12/12- 96%, 94%, 96%, 454 m. Good distance with oxygenation well maintained.  06/02/13-79 yoM former smoker followed for COPD       PCP Dr Monico Blitz     Wife here FOLLOWS FOR: feels like he may be a  little worse than last visit with breathing; SOB with activity. Has questions/concerns with Tudorza-has cough and doesnt feel as well while taking it. Dyspnea on exertion is not improved. Has cough, productive clear phlegm.  Urinary syncope episode. a1AT- WNL MM 142  12/02/13- 79 yoM former smoker followed for COPD       PCP Dr Monico Blitz     Wife here FOLLOWS FOR: Pt states his breathing is not any better since last OV.  SOB with activity as well. Dyspnea with climbing 2 flights of stairs and has to stop to get his breath before going on. This is unchanged. Some cough with white phlegm. Denies chest pain, palpitations or edema. Uses treadmill and step exerciser some.  01/22/14 Acute OV Complains of increased SOB x2 weeks with prod cough with yellow mucus.  But says it is clear to yellow at times. No fever. Denies any wheezing, chest tightness, hemoptysis, f/c/s, nausea, vomiting.   Sats were 89% upon entering exam room. After rest 93% on RA. We discussed oxygen and he wants to wait for this .  CXR w/ no acute process , stable COPD 12/02/13  03/04/14- 79 yoM former smoker followed for Severe COPD, vasomotor rhinitis       PCP Dr Monico Blitz     Wife here FOLLOWS FOR: Pt states breathing is unchanged. Pt c/o of SOB with exertion and intermittent productive cough with yellow mucous. Denied CP/tightness.  Dyspnea doing yard work, climbing hills. Comfortable at rest and with ADLs. Home oximetry usually 90%, rarely 85 on  room air. Says cardiac tests were negative in the past.  07/05/14- 80 yoM former smoker followed for Severe COPD, vasomotor rhinitis       PCP Dr Monico Blitz     Wife here Has had flu shot FOLLOWS FOR: patient stays congested(morning time); SOB or wheezing with activity. Patient has been losing weight-recently dx'd with DM-diet controlled.   Has had flu vax Some wheeze. Daily light cough productive clear. Less DOE. Continuing theodur, Advair [place], Tudorza.  01/03/15- 80 yoM former  smoker followed for Severe COPD/ emphysema, vasomotor rhinitis       PCP Dr Monico Blitz     Wife here FOLLOWS FOR: Pt states he is fine when sitting(rest) but with exertion he gets SOB and wheezing.     ROS-see HPI Constitutional:   No-   weight loss, night sweats, fevers, chills, fatigue, lassitude. HEENT:   No-  headaches, difficulty swallowing, tooth/dental problems, sore throat,       No-  sneezing, itching, ear ache, nasal congestion, post nasal drip,  CV:  No-   chest pain, orthopnea, PND, swelling in lower extremities, anasarca,                                                    dizziness, palpitations Resp: +shortness of breath with exertion or at rest.             +productive cough,  No non-productive cough,  No- coughing up of blood.              No-   change in color of mucus.  No- wheezing.   Skin: No-   rash or lesions. GI:  No-   heartburn, indigestion, abdominal pain, nausea, vomiting, GU: . MS:  No-   joint pain or swelling.  . Neuro-     nothing unusual Psych:  No- change in mood or affect. No depression or anxiety.  No memory loss.  OBJ- Physical Exam General- Alert, Oriented, Affect-appropriate, Distress- none acute, trim Skin- rash-none, lesions- none, excoriation- none Lymphadenopathy- none Head- atraumatic            Eyes- Gross vision intact, PERRLA, conjunctivae and secretions clear            Ears- Hearing, canals-normal            Nose- Clear, no-Septal dev, mucus, polyps, erosion, perforation             Throat- Mallampati II , mucosa clear , drainage- none, tonsils- atrophic Neck- flexible , trachea midline, no stridor , thyroid nl, carotid no bruit Chest - symmetrical excursion , unlabored           Heart/CV- RRR , no murmur , no gallop  , no rub, nl s1 s2                           - JVD- none , edema- none, stasis changes- none, varices- none           Lung- clear to P&A/ diminished, wheeze- none, cough- none , dullness-none, rub- none           Chest  wall-  Abd-  Br/ Gen/ Rectal- Not done, not indicated Extrem- cyanosis- none, clubbing, none, atrophy- none, strength- nl Neuro- grossly intact to observation

## 2015-01-03 NOTE — Patient Instructions (Signed)
Script printed for Aerochamber spacer to use with your Advair HFA inhaler Continue 2 puffs then rinse mouth, twice daily  Order CXR dx COPD with centrilobular emphysema  Please call as needed

## 2015-01-04 NOTE — Progress Notes (Signed)
Quick Note:  Called and spoke to pt. Informed pt of the results and recs per CY. Pt verbalized understanding and denied any further questions or concerns at this time. ______ 

## 2015-01-11 ENCOUNTER — Other Ambulatory Visit: Payer: Self-pay | Admitting: Dermatology

## 2015-01-17 ENCOUNTER — Other Ambulatory Visit: Payer: Self-pay | Admitting: Internal Medicine

## 2015-01-18 ENCOUNTER — Telehealth: Payer: Self-pay | Admitting: Internal Medicine

## 2015-01-18 MED ORDER — THEOPHYLLINE ER 200 MG PO TB12: 200.0000 mg | ORAL_TABLET | Freq: Three times a day (TID) | ORAL | Status: DC

## 2015-01-18 NOTE — Telephone Encounter (Signed)
Spoke with [employer], pharmacist- she states pt is at pharmacy now to pick up his theophylline 200mg  rx.  It looks like [name] had put this in yesterday and pended the order.  Pt was seen on 3/28.  I verbally put this order in, updated med refill in chart.  Nothing further needed.

## 2015-02-02 ENCOUNTER — Telehealth: Payer: Self-pay | Admitting: Internal Medicine

## 2015-02-02 MED ORDER — AZITHROMYCIN 250 MG PO TABS: ORAL_TABLET | ORAL | Status: AC

## 2015-02-02 NOTE — Telephone Encounter (Signed)
Called and spoke to pt. Pt c/o increase in SOB, prod cough with yellow and orange mucus, fatigue and chest congestion. Pt unsure how long this has been going on. Pt last seen on 01/03/15 and stated his s/s started after appt with CY. Pt denies CP/tightness, f/c/s, and swelling.   CY please advise on recs.   No Known Allergies  Current Outpatient Prescriptions on File Prior to Visit  Medication Sig Dispense Refill  . Aclidinium Bromide (TUDORZA PRESSAIR) 400 MCG/ACT AEPB Inhale 1 Inhaler into the lungs 2 (two) times daily.     [name] ADVAIR HFA 230-21 MCG/ACT inhaler INHALE TWO PUFFS INTO THE LUNGS TWICE DAILY 36 g 3  . aspirin 81 MG tablet Take 81 mg by mouth daily.    . B Complex-C (B-COMPLEX WITH VITAMIN C) tablet Take 1 tablet by mouth daily.    . Cholecalciferol (D3 MAXIMUM STRENGTH) 5000 UNITS capsule Take 5,000 Units by mouth daily.    . fish oil-omega-3 fatty acids 1000 MG capsule 1400 mg daily.    . indapamide (LOZOL) 2.5 MG tablet Take 2.5 mg by mouth every morning.    [name] ipratropium (ATROVENT) 0.06 % nasal spray USE 1 OR 2 SPRAYS IN EACH NOSTRIL UP TO FOUR TIMES DAILY AS NEEDED 120 mL 61  . Multiple Vitamin (MULTIVITAMIN) tablet Take 1 tablet by mouth daily.    . nitroGLYCERIN (NITROSTAT) 0.4 MG SL tablet Place 0.4 mg under the tongue every 5 (five) minutes as needed for chest pain.     [name] omeprazole (PRILOSEC) 20 MG capsule Take 20 mg by mouth daily.    . polyethylene glycol (MIRALAX / GLYCOLAX) packet Take 17 g by mouth daily.    . potassium phosphate, monobasic, (K-PHOS ORIGINAL) 500 MG tablet Take 500 mg by mouth daily.     . simvastatin (ZOCOR) 20 MG tablet 1/2 tablet daily.    [name] Spacer/Aero-Holding Chambers (AEROCHAMBER MV) inhaler Use as instructed 1 each 0  . theophylline (THEODUR) 200 MG 12 hr tablet Take 1 tablet (200 mg total) by mouth 3 (three) times daily. 90 tablet 2   No current facility-administered medications on file prior to visit.

## 2015-02-02 NOTE — Telephone Encounter (Signed)
Offer Z pak Advise him to reduce theophylline to 100 mg twice daily until he finishes Z pak. Then ok to go back to theophylline 100 mg, 3 times daily

## 2015-02-02 NOTE — Telephone Encounter (Signed)
Pt is aware of CY's recommendation. Rx has been sent in. Nothing further was needed. 

## 2015-03-31 ENCOUNTER — Other Ambulatory Visit: Payer: Self-pay | Admitting: Internal Medicine

## 2015-03-31 MED ORDER — THEOPHYLLINE ER 400 MG PO TB24: ORAL_TABLET | ORAL | Status: DC

## 2015-03-31 NOTE — Telephone Encounter (Signed)
Received paper refill request from [facility] for Theophylline 200mg , no longer available per pharmacy. Could we please change to Theophylline ER 400mg  1/2 tab three times a day. These tablets are scored and ok to cut in 1/2.  Per CY yes to refill x 11 #45  Called in to pharmacist with medication change. Med list updated. Nothing further needed.

## 2015-04-08 ENCOUNTER — Other Ambulatory Visit: Payer: Self-pay | Admitting: Internal Medicine

## 2015-04-29 ENCOUNTER — Telehealth: Payer: Self-pay | Admitting: Internal Medicine

## 2015-04-29 MED ORDER — ALBUTEROL SULFATE HFA 108 (90 BASE) MCG/ACT IN AERS: INHALATION_SPRAY | RESPIRATORY_TRACT | Status: DC

## 2015-04-29 NOTE — Telephone Encounter (Signed)
Patient is leaving to go out of town for the weekend and is requesting a refill on Ventolin HFA to take with him  Needs this medication sent to [facility].  Medication is not on patient's current med list, wife says that patient has not taken it in a while.  Current Outpatient Prescriptions on File Prior to Visit  Medication Sig Dispense Refill  . Aclidinium Bromide (TUDORZA PRESSAIR) 400 MCG/ACT AEPB Inhale 1 Inhaler into the lungs 2 (two) times daily.     [name] ADVAIR HFA 230-21 MCG/ACT inhaler INHALE TWO PUFFS INTO THE LUNGS TWICE DAILY 36 g 3  . aspirin 81 MG tablet Take 81 mg by mouth daily.    . B Complex-C (B-COMPLEX WITH VITAMIN C) tablet Take 1 tablet by mouth daily.    . Cholecalciferol (D3 MAXIMUM STRENGTH) 5000 UNITS capsule Take 5,000 Units by mouth daily.    . fish oil-omega-3 fatty acids 1000 MG capsule 1400 mg daily.    . indapamide (LOZOL) 2.5 MG tablet Take 2.5 mg by mouth every morning.    [name] ipratropium (ATROVENT) 0.06 % nasal spray USE 1 OR 2 SPRAYS IN EACH NOSTRIL UP TO FOUR TIMES DAILY AS NEEDED 120 mL 61  . Multiple Vitamin (MULTIVITAMIN) tablet Take 1 tablet by mouth daily.    . nitroGLYCERIN (NITROSTAT) 0.4 MG SL tablet Place 0.4 mg under the tongue every 5 (five) minutes as needed for chest pain.     [name] omeprazole (PRILOSEC) 20 MG capsule Take 20 mg by mouth daily.    . polyethylene glycol (MIRALAX / GLYCOLAX) packet Take 17 g by mouth daily.    . potassium phosphate, monobasic, (K-PHOS ORIGINAL) 500 MG tablet Take 500 mg by mouth daily.     . simvastatin (ZOCOR) 20 MG tablet 1/2 tablet daily.    [name] Spacer/Aero-Holding Chambers (AEROCHAMBER MV) inhaler Use as instructed 1 each 0  . theophylline (THEODUR) 200 MG 12 hr tablet Take 1 tablet (200 mg total) by mouth 3 (three) times daily. 90 tablet 2  . theophylline (UNIPHYL) 400 MG 24 hr tablet Take 1/2 tablet by mouth three times daily 45 tablet 11   No current facility-administered medications on file prior to visit.   No  Known Allergies

## 2015-04-29 NOTE — Telephone Encounter (Signed)
RX for ventolin has been sent in for pt.  Called pt and made aware. Nothing further needed

## 2015-04-29 NOTE — Telephone Encounter (Signed)
Ok Ventolin HFA, # 1,   2 puffs every 4-6 hours as directed, refill x 11

## 2015-04-29 NOTE — Telephone Encounter (Signed)
[facility] PT line rang numerous times, no answer W[facility]

## 2015-05-30 ENCOUNTER — Other Ambulatory Visit: Payer: Self-pay | Admitting: Dermatology

## 2015-06-16 ENCOUNTER — Encounter: Payer: Self-pay | Admitting: Internal Medicine

## 2015-06-16 ENCOUNTER — Ambulatory Visit (INDEPENDENT_AMBULATORY_CARE_PROVIDER_SITE_OTHER): Payer: Medicare Other | Admitting: Internal Medicine

## 2015-06-16 VITALS — BP 118/58 | HR 67 | Ht 71.0 in | Wt 183.0 lb

## 2015-06-16 DIAGNOSIS — J441 Chronic obstructive pulmonary disease with (acute) exacerbation: Secondary | ICD-10-CM | POA: Diagnosis not present

## 2015-06-16 DIAGNOSIS — R0609 Other forms of dyspnea: Secondary | ICD-10-CM

## 2015-06-16 DIAGNOSIS — R0902 Hypoxemia: Secondary | ICD-10-CM

## 2015-06-16 DIAGNOSIS — Z23 Encounter for immunization: Secondary | ICD-10-CM | POA: Diagnosis not present

## 2015-06-16 DIAGNOSIS — J432 Centrilobular emphysema: Secondary | ICD-10-CM

## 2015-06-16 NOTE — Progress Notes (Signed)
10/27/12- 81 yo M former smoker- Pt referred by Dr Manuella Ghazi. pt states having sob upon heavy activity. Wife is a patient of mine. He had smoked one to 2 packs per day for 35 years, ending in 100. 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 He was diagnosed with COPD/emphysema by pulmonologist Dr. Koleen Nimrod from [place]. Shortness of breath with exertion for years is getting gradually worse. Cough occasionally productive of clear mucus with no blood. He can climb one flight of stairs without stopping- he's breathing hard at the top. No chest pain, palpitation or swelling. He has not noticed dramatic benefit from tudorza, albuterol HFA,, Advair 250. He also complains of anosmia for at least several years. He blames this on working with ammonia fumes. He is using Flonase and Atrovent nasal sprays Father died of DVT/PE mother died of stroke. He doesn't know of any family members with lung disease.  // needs a1AT//  11/27/12- 78 yoM former smoker followed for COPD       PCP Dr Monico Blitz FOLLOWS FOR: reivew PFT and 6MW with patient; still having SOB with heavy activity. Wife here. No change in no acute event since last here. Persistent dyspnea with exertion but he does 30 minutes daily at 3 miles per hour on treadmill. Likes ipratropium nasal spray. Has had pneumonia vaccine after age 68. CXR 10/28/12 IMPRESSION:  COPD. No active lung disease.  Original Report Authenticated By: Ivar Drape, M.D. PFT2/5/14- severe obstructive airways disease with slight response to bronchodilator. Air-trapping with increased residual volume. Diffusion mildly reduced. Emphysema pattern. FVC 2.56/59%, FEV1 1.17/41%, FEV1/FVC 0.45. Residual volume 132%, DLCO 78%. 6MWT-11/12/12- 96%, 94%, 96%, 454 m. Good distance with oxygenation well maintained.  06/02/13-79 yoM former smoker followed for COPD       PCP Dr Monico Blitz     Wife here FOLLOWS FOR: feels like he may be a  little worse than last visit with breathing; SOB with activity. Has questions/concerns with Tudorza-has cough and doesnt feel as well while taking it. Dyspnea on exertion is not improved. Has cough, productive clear phlegm.  Urinary syncope episode. a1AT- WNL MM 142  12/02/13- 79 yoM former smoker followed for COPD       PCP Dr Monico Blitz     Wife here FOLLOWS FOR: Pt states his breathing is not any better since last OV.  SOB with activity as well. Dyspnea with climbing 2 flights of stairs and has to stop to get his breath before going on. This is unchanged. Some cough with white phlegm. Denies chest pain, palpitations or edema. Uses treadmill and step exerciser some.  01/22/14 Acute OV Complains of increased SOB x2 weeks with prod cough with yellow mucus.  But says it is clear to yellow at times. No fever. Denies any wheezing, chest tightness, hemoptysis, f/c/s, nausea, vomiting.   Sats were 89% upon entering exam room. After rest 93% on RA. We discussed oxygen and he wants to wait for this .  CXR w/ no acute process , stable COPD 12/02/13  03/04/14- 79 yoM former smoker followed for Severe COPD, vasomotor rhinitis       PCP Dr Monico Blitz     Wife here FOLLOWS FOR: Pt states breathing is unchanged. Pt c/o of SOB with exertion and intermittent productive cough with yellow mucous. Denied CP/tightness.  Dyspnea doing yard work, climbing hills. Comfortable at rest and with ADLs. Home oximetry usually 90%, rarely 85 on  room air. Says cardiac tests were negative in the past.  07/05/14- 80 yoM former smoker followed for Severe COPD, vasomotor rhinitis       PCP Dr Monico Blitz     Wife here Has had flu shot FOLLOWS FOR: patient stays congested(morning time); SOB or wheezing with activity. Patient has been losing weight-recently dx'd with DM-diet controlled.   Has had flu vax Some wheeze. Daily light cough productive clear. Less DOE. Continuing theodur, Advair [place], Tudorza.  01/03/15- 80 yoM former  smoker followed for Severe COPD/ emphysema, vasomotor rhinitis       PCP Dr Monico Blitz     Wife here FOLLOWS FOR: Pt states he is fine when sitting(rest) but with exertion he gets SOB and wheezing.  06/16/15-  81 yoM former smoker followed for Severe COPD/ emphysema, vasomotor rhinitis       PCP Dr Monico Blitz     Wife here FOLLOWS FOR: pt states he is having trouble breathing and has congestion he has had for 2 - 3 months. pt has a productive cough with yellowish mucus.  Increased dyspnea on exertion, cough, wheeze. Using Advair, Tudorza and his rescue inhaler. Uses wife's nebulizer with albuterol. We reviewed chest x-ray CXR 01/03/15 IMPRESSION: COPD. There is no active cardiopulmonary disease. Electronically Signed  By: David Martinique  On: 01/03/2015 12:15  ROS-see HPI Constitutional:   No-   weight loss, night sweats, fevers, chills, fatigue, lassitude. HEENT:   No-  headaches, difficulty swallowing, tooth/dental problems, sore throat,       No-  sneezing, itching, ear ache, nasal congestion, post nasal drip,  CV:  No-   chest pain, orthopnea, PND, swelling in lower extremities, anasarca,                                                                              dizziness, palpitations Resp: +shortness of breath with exertion or at rest.             +productive cough,  No non-productive cough,  No- coughing up of blood.              No-   change in color of mucus.  No- wheezing.   Skin: No-   rash or lesions. GI:  No-   heartburn, indigestion, abdominal pain, nausea, vomiting, GU: . MS:  No-   joint pain or swelling.  . Neuro-     nothing unusual Psych:  No- change in mood or affect. No depression or anxiety.  No memory loss.  OBJ- Physical Exam General- Alert, Oriented, Affect-appropriate, Distress- none acute, trim Skin- rash-none, lesions- none, excoriation- none Lymphadenopathy- none Head- atraumatic            Eyes- Gross vision intact, PERRLA, conjunctivae and secretions  clear            Ears- Hearing, canals-normal            Nose- Clear, no-Septal dev, mucus, polyps, erosion, perforation             Throat- Mallampati II , mucosa clear , drainage- none, tonsils- atrophic Neck- flexible , trachea midline, no stridor , thyroid nl, carotid no bruit Chest - symmetrical excursion ,  unlabored           Heart/CV- RRR , no murmur , no gallop  , no rub, nl s1 s2                           - JVD- none , edema- none, stasis changes- none, varices- none           Lung- clear to P&A/ diminished, wheeze- none, cough- none , dullness-none, rub- none           Chest wall-  Abd-  Br/ Gen/ Rectal- Not done, not indicated Extrem- cyanosis- none, clubbing, none, atrophy- none, strength- nl Neuro- grossly intact to observation

## 2015-06-16 NOTE — Patient Instructions (Signed)
Ask the drug store if your insurance would cover your preferred Ventolin brand of the albuterol rescue inhaler instead of the yellow Proventil  Sample Anoro Ellipta inhaler     Inhale 1 puff, once daily.    Try this instead of Advair and Tudorza for comparison.   Flu vax  Order   6 Minute walk test,   ONOX on room air     Dx centrilobular emphysema, Dyspnea with exertion

## 2015-06-17 ENCOUNTER — Telehealth: Payer: Self-pay | Admitting: Internal Medicine

## 2015-06-17 NOTE — Telephone Encounter (Signed)
ATC received fast busy signal x 3 wcb

## 2015-06-17 NOTE — Telephone Encounter (Signed)
Patient's wife called back and said she does not need to speak with anyone now.  May close.

## 2015-06-20 ENCOUNTER — Telehealth: Payer: Self-pay | Admitting: Internal Medicine

## 2015-06-20 NOTE — Telephone Encounter (Signed)
Left message for pt to call back  °

## 2015-06-20 NOTE — Telephone Encounter (Signed)
Called and spoke with pt's wife Wife concerned because she has not heard from [facility] about setting up ONO Informed that that orders have been sent to [facility] and that it can take a couple of days to hear back from them Wife voiced understanding  Wife also stated that she has a nebulizer machine at home that she thought her husband could benefit from Pt was requesting that Dr Annamaria Boots call in medication via nebulizer that pt can take as needed when breathing becomes difficult Informed pt that i would sent Dr Annamaria Boots a message stating this request  Dr Annamaria Boots, are you ok with order prn neb treatments for pt? Please advise. Thanks   No Known Allergies   Current Outpatient Prescriptions on File Prior to Visit  Medication Sig Dispense Refill  . Aclidinium Bromide (TUDORZA PRESSAIR) 400 MCG/ACT AEPB Inhale 1 Inhaler into the lungs 2 (two) times daily.     [name] ADVAIR HFA 230-21 MCG/ACT inhaler INHALE TWO PUFFS INTO THE LUNGS TWICE DAILY 36 g 3  . albuterol (PROVENTIL HFA;VENTOLIN HFA) 108 (90 BASE) MCG/ACT inhaler 2 puffs every 4-6 hours as needed 1 Inhaler 11  . aspirin 81 MG tablet Take 81 mg by mouth daily.    . B Complex-C (B-COMPLEX WITH VITAMIN C) tablet Take 1 tablet by mouth daily.    . Cholecalciferol (D3 MAXIMUM STRENGTH) 5000 UNITS capsule Take 5,000 Units by mouth daily.    . fish oil-omega-3 fatty acids 1000 MG capsule 1400 mg daily.    . indapamide (LOZOL) 2.5 MG tablet Take 2.5 mg by mouth every morning.    [name] ipratropium (ATROVENT) 0.06 % nasal spray USE 1 OR 2 SPRAYS IN EACH NOSTRIL UP TO FOUR TIMES DAILY AS NEEDED 120 mL 61  . metFORMIN (GLUCOPHAGE) 500 MG tablet Take 500 mg by mouth daily.  2  . Multiple Vitamin (MULTIVITAMIN) tablet Take 1 tablet by mouth daily.    . nitroGLYCERIN (NITROSTAT) 0.4 MG SL tablet Place 0.4 mg under the tongue every 5 (five) minutes as needed for chest pain.     [name] omeprazole (PRILOSEC) 20 MG capsule Take 20 mg by mouth daily.    .  polyethylene glycol (MIRALAX / GLYCOLAX) packet Take 17 g by mouth daily.    . potassium phosphate, monobasic, (K-PHOS ORIGINAL) 500 MG tablet Take 500 mg by mouth daily.     . simvastatin (ZOCOR) 20 MG tablet 1/2 tablet daily.    [name] Spacer/Aero-Holding Chambers (AEROCHAMBER MV) inhaler Use as instructed 1 each 0  . theophylline (THEODUR) 200 MG 12 hr tablet Take 1 tablet (200 mg total) by mouth 3 (three) times daily. 90 tablet 2  . theophylline (UNIPHYL) 400 MG 24 hr tablet Take 1/2 tablet by mouth three times daily 45 tablet 11   No current facility-administered medications on file prior to visit.

## 2015-06-20 NOTE — Telephone Encounter (Signed)
Ok  Albuterol neb solution    # 75 ml/ 25 ampules    1 every 6 hours    Ref x 5

## 2015-06-21 MED ORDER — ALBUTEROL SULFATE (2.5 MG/3ML) 0.083% IN NEBU: 2.5000 mg | INHALATION_SOLUTION | Freq: Four times a day (QID) | RESPIRATORY_TRACT | Status: DC | PRN

## 2015-06-21 NOTE — Telephone Encounter (Signed)
Called spouse and is aware of recs. RX sent in. Nothing further needed

## 2015-06-23 ENCOUNTER — Telehealth: Payer: Self-pay | Admitting: Internal Medicine

## 2015-06-23 MED ORDER — UMECLIDINIUM-VILANTEROL 62.5-25 MCG/INH IN AEPB: INHALATION_SPRAY | RESPIRATORY_TRACT | Status: DC

## 2015-06-23 NOTE — Telephone Encounter (Signed)
Per 06/16/15 OV: Sample Anoro Ellipta inhaler     Inhale 1 puff, once daily.    Try this instead of Advair and Tudorza for comparison ---  Called spoke with pt spouse, Remo Lipps. She reports pt tried the anoro sample and feels it helps the same as tudorza but likes the anoro is only once daily. Wants RX for this. Please advise Dr. Annamaria Boots thanks  --[facility] in [place]

## 2015-06-23 NOTE — Telephone Encounter (Signed)
Called pt. Aware of recs. RX sent in. Nothing further needed 

## 2015-06-23 NOTE — Telephone Encounter (Signed)
Ok to [facility] and Rx Anoro  #1,  inhale 1 puff, once daily

## 2015-07-06 ENCOUNTER — Ambulatory Visit: Payer: Medicare Other | Admitting: Internal Medicine

## 2015-07-11 ENCOUNTER — Telehealth: Payer: Self-pay | Admitting: Internal Medicine

## 2015-07-11 DIAGNOSIS — J432 Centrilobular emphysema: Secondary | ICD-10-CM

## 2015-07-11 NOTE — Telephone Encounter (Signed)
Pt is aware of results and agrees for order to be placed. Order placed and nothing further needed at this time.

## 2015-07-15 ENCOUNTER — Telehealth: Payer: Self-pay | Admitting: Internal Medicine

## 2015-07-15 ENCOUNTER — Emergency Department (HOSPITAL_COMMUNITY): Payer: Medicare Other

## 2015-07-15 ENCOUNTER — Encounter (HOSPITAL_COMMUNITY): Payer: Self-pay | Admitting: *Deleted

## 2015-07-15 ENCOUNTER — Inpatient Hospital Stay (HOSPITAL_COMMUNITY)
Admission: EM | Admit: 2015-07-15 | Discharge: 2015-07-18 | DRG: 064 | Disposition: A | Discharge status: Home / Self Care | Payer: Medicare Other | Attending: Internal Medicine | Admitting: Internal Medicine

## 2015-07-15 DIAGNOSIS — E119 Type 2 diabetes mellitus without complications: Secondary | ICD-10-CM | POA: Diagnosis present

## 2015-07-15 DIAGNOSIS — Z7982 Long term (current) use of aspirin: Secondary | ICD-10-CM | POA: Diagnosis not present

## 2015-07-15 DIAGNOSIS — I10 Essential (primary) hypertension: Secondary | ICD-10-CM | POA: Diagnosis present

## 2015-07-15 DIAGNOSIS — I63239 Cerebral infarction due to unspecified occlusion or stenosis of unspecified carotid arteries: Secondary | ICD-10-CM | POA: Diagnosis not present

## 2015-07-15 DIAGNOSIS — I6789 Other cerebrovascular disease: Secondary | ICD-10-CM | POA: Diagnosis not present

## 2015-07-15 DIAGNOSIS — E871 Hypo-osmolality and hyponatremia: Secondary | ICD-10-CM | POA: Diagnosis present

## 2015-07-15 DIAGNOSIS — R0602 Shortness of breath: Secondary | ICD-10-CM | POA: Diagnosis present

## 2015-07-15 DIAGNOSIS — F0390 Unspecified dementia without behavioral disturbance: Secondary | ICD-10-CM | POA: Diagnosis present

## 2015-07-15 DIAGNOSIS — K219 Gastro-esophageal reflux disease without esophagitis: Secondary | ICD-10-CM | POA: Diagnosis present

## 2015-07-15 DIAGNOSIS — G9340 Encephalopathy, unspecified: Secondary | ICD-10-CM | POA: Insufficient documentation

## 2015-07-15 DIAGNOSIS — R0902 Hypoxemia: Secondary | ICD-10-CM

## 2015-07-15 DIAGNOSIS — I639 Cerebral infarction, unspecified: Secondary | ICD-10-CM | POA: Diagnosis present

## 2015-07-15 DIAGNOSIS — J441 Chronic obstructive pulmonary disease with (acute) exacerbation: Secondary | ICD-10-CM | POA: Diagnosis present

## 2015-07-15 DIAGNOSIS — R410 Disorientation, unspecified: Secondary | ICD-10-CM | POA: Diagnosis not present

## 2015-07-15 DIAGNOSIS — J9601 Acute respiratory failure with hypoxia: Secondary | ICD-10-CM | POA: Diagnosis present

## 2015-07-15 DIAGNOSIS — Z823 Family history of stroke: Secondary | ICD-10-CM

## 2015-07-15 DIAGNOSIS — Z87891 Personal history of nicotine dependence: Secondary | ICD-10-CM

## 2015-07-15 DIAGNOSIS — E876 Hypokalemia: Secondary | ICD-10-CM | POA: Diagnosis present

## 2015-07-15 DIAGNOSIS — E7800 Pure hypercholesterolemia, unspecified: Secondary | ICD-10-CM | POA: Diagnosis present

## 2015-07-15 DIAGNOSIS — R4182 Altered mental status, unspecified: Secondary | ICD-10-CM | POA: Diagnosis present

## 2015-07-15 DIAGNOSIS — J9611 Chronic respiratory failure with hypoxia: Secondary | ICD-10-CM

## 2015-07-15 LAB — TROPONIN I

## 2015-07-15 LAB — DIFFERENTIAL
BASOS ABS: 0 10*3/uL (ref 0.0–0.1)
BASOS PCT: 0 %
Eosinophils Absolute: 0.1 10*3/uL (ref 0.0–0.7)
Eosinophils Relative: 1 %
LYMPHS ABS: 1.2 10*3/uL (ref 0.7–4.0)
LYMPHS PCT: 9 %
MONOS PCT: 8 %
Monocytes Absolute: 1 10*3/uL (ref 0.1–1.0)
NEUTROS ABS: 10.8 10*3/uL — AB (ref 1.7–7.7)
Neutrophils Relative %: 82 %

## 2015-07-15 LAB — CBC
HEMATOCRIT: 45 % (ref 39.0–52.0)
HEMOGLOBIN: 15.9 g/dL (ref 13.0–17.0)
MCH: 32.3 pg (ref 26.0–34.0)
MCHC: 35.3 g/dL (ref 30.0–36.0)
MCV: 91.5 fL (ref 78.0–100.0)
Platelets: 242 10*3/uL (ref 150–400)
RBC: 4.92 MIL/uL (ref 4.22–5.81)
RDW: 12.5 % (ref 11.5–15.5)
WBC: 13.2 10*3/uL — AB (ref 4.0–10.5)

## 2015-07-15 LAB — URINALYSIS, ROUTINE W REFLEX MICROSCOPIC
BILIRUBIN URINE: NEGATIVE
Glucose, UA: NEGATIVE mg/dL
KETONES UR: NEGATIVE mg/dL
Leukocytes, UA: NEGATIVE
NITRITE: NEGATIVE
Protein, ur: NEGATIVE mg/dL
SPECIFIC GRAVITY, URINE: 1.015 (ref 1.005–1.030)
UROBILINOGEN UA: 0.2 mg/dL (ref 0.0–1.0)
pH: 7 (ref 5.0–8.0)

## 2015-07-15 LAB — COMPREHENSIVE METABOLIC PANEL
ALK PHOS: 67 U/L (ref 38–126)
ALT: 20 U/L (ref 17–63)
AST: 24 U/L (ref 15–41)
Albumin: 4.1 g/dL (ref 3.5–5.0)
Anion gap: 9 (ref 5–15)
BUN: 17 mg/dL (ref 6–20)
CALCIUM: 9.2 mg/dL (ref 8.9–10.3)
CHLORIDE: 87 mmol/L — AB (ref 101–111)
CO2: 32 mmol/L (ref 22–32)
CREATININE: 1.04 mg/dL (ref 0.61–1.24)
GFR calc Af Amer: >60 mL/min (ref >60)
Glucose, Bld: 231 mg/dL — ABNORMAL HIGH (ref 65–99)
Potassium: 3.8 mmol/L (ref 3.5–5.1)
Sodium: 128 mmol/L — ABNORMAL LOW (ref 135–145)
Total Bilirubin: 0.7 mg/dL (ref 0.3–1.2)
Total Protein: 7.4 g/dL (ref 6.5–8.1)

## 2015-07-15 LAB — BLOOD GAS, ARTERIAL
ACID-BASE EXCESS: 3.4 mmol/L — AB (ref 0.0–2.0)
Bicarbonate: 27.6 mEq/L — ABNORMAL HIGH (ref 20.0–24.0)
Drawn by: 105551
O2 CONTENT: 2 L/min
O2 SAT: 96.9 %
PCO2 ART: 37.8 mmHg (ref 35.0–45.0)
PO2 ART: 88.3 mmHg (ref 80.0–100.0)
Patient temperature: 98.6
pH, Arterial: 7.466 — ABNORMAL HIGH (ref 7.350–7.450)

## 2015-07-15 LAB — URINE MICROSCOPIC-ADD ON

## 2015-07-15 LAB — RAPID URINE DRUG SCREEN, HOSP PERFORMED
Amphetamines: NOT DETECTED
BARBITURATES: NOT DETECTED
Benzodiazepines: NOT DETECTED
Cocaine: NOT DETECTED
OPIATES: NOT DETECTED
TETRAHYDROCANNABINOL: NOT DETECTED

## 2015-07-15 LAB — ETHANOL: Alcohol, Ethyl (B): <5 mg/dL (ref <5)

## 2015-07-15 LAB — PROTIME-INR
INR: 1.1 (ref 0.00–1.49)
Prothrombin Time: 14.4 seconds (ref 11.6–15.2)

## 2015-07-15 LAB — BRAIN NATRIURETIC PEPTIDE: B Natriuretic Peptide: 24 pg/mL (ref 0.0–100.0)

## 2015-07-15 LAB — APTT: APTT: 26 s (ref 24–37)

## 2015-07-15 MED ORDER — DEXTROSE 5 % IV SOLN
500.0000 mg | INTRAVENOUS | Status: DC
  Administered 2015-07-16 – 2015-07-17 (×3): 500 mg via INTRAVENOUS
  Filled 2015-07-15 (×4): qty 500

## 2015-07-15 MED ORDER — IPRATROPIUM-ALBUTEROL 0.5-2.5 (3) MG/3ML IN SOLN
3.0000 mL | Freq: Four times a day (QID) | RESPIRATORY_TRACT | Status: DC
  Administered 2015-07-16 (×4): 3 mL via RESPIRATORY_TRACT
  Filled 2015-07-15 (×4): qty 3

## 2015-07-15 MED ORDER — ENOXAPARIN SODIUM 40 MG/0.4ML ~~LOC~~ SOLN
40.0000 mg | SUBCUTANEOUS | Status: DC
  Administered 2015-07-16 – 2015-07-17 (×2): 40 mg via SUBCUTANEOUS
  Filled 2015-07-15: qty 0.4

## 2015-07-15 MED ORDER — METHYLPREDNISOLONE SODIUM SUCC 125 MG IJ SOLR
60.0000 mg | Freq: Four times a day (QID) | INTRAMUSCULAR | Status: DC
  Administered 2015-07-16 – 2015-07-17 (×6): 60 mg via INTRAVENOUS
  Filled 2015-07-15 (×6): qty 2

## 2015-07-15 MED ORDER — ASPIRIN 81 MG PO CHEW
81.0000 mg | CHEWABLE_TABLET | Freq: Every day | ORAL | Status: DC
  Administered 2015-07-16 – 2015-07-17 (×2): 81 mg via ORAL
  Filled 2015-07-15 (×2): qty 1

## 2015-07-15 MED ORDER — SODIUM CHLORIDE 0.9 % IV SOLN
INTRAVENOUS | Status: DC
Start: 2015-07-16 — End: 2015-07-18
  Administered 2015-07-16 – 2015-07-17 (×3): via INTRAVENOUS

## 2015-07-15 MED ORDER — ACETAMINOPHEN 325 MG PO TABS: 650.0000 mg | ORAL_TABLET | Freq: Four times a day (QID) | ORAL | Status: DC | PRN

## 2015-07-15 MED ORDER — DEXTROSE 5 % IV SOLN
1.0000 g | INTRAVENOUS | Status: DC
  Administered 2015-07-16 – 2015-07-17 (×3): 1 g via INTRAVENOUS
  Filled 2015-07-15 (×4): qty 10

## 2015-07-15 MED ORDER — ALBUTEROL SULFATE (2.5 MG/3ML) 0.083% IN NEBU
2.5000 mg | INHALATION_SOLUTION | Freq: Once | RESPIRATORY_TRACT | Status: AC
  Administered 2015-07-15: 2.5 mg via RESPIRATORY_TRACT
  Filled 2015-07-15: qty 3

## 2015-07-15 MED ORDER — AZITHROMYCIN 250 MG PO TABS: ORAL_TABLET | ORAL | Status: AC

## 2015-07-15 MED ORDER — ACETAMINOPHEN 650 MG RE SUPP: 650.0000 mg | Freq: Four times a day (QID) | RECTAL | Status: DC | PRN

## 2015-07-15 MED ORDER — PREDNISONE 20 MG PO TABS: 20.0000 mg | ORAL_TABLET | Freq: Every day | ORAL | Status: DC

## 2015-07-15 MED ORDER — ONDANSETRON HCL 4 MG PO TABS: 4.0000 mg | ORAL_TABLET | Freq: Four times a day (QID) | ORAL | Status: DC | PRN

## 2015-07-15 MED ORDER — IPRATROPIUM-ALBUTEROL 0.5-2.5 (3) MG/3ML IN SOLN
3.0000 mL | Freq: Once | RESPIRATORY_TRACT | Status: AC
  Administered 2015-07-15: 3 mL via RESPIRATORY_TRACT
  Filled 2015-07-15: qty 3

## 2015-07-15 MED ORDER — POLYETHYLENE GLYCOL 3350 17 G PO PACK: 17.0000 g | PACK | Freq: Every day | ORAL | Status: DC | PRN

## 2015-07-15 MED ORDER — ONDANSETRON HCL 4 MG/2ML IJ SOLN: 4.0000 mg | Freq: Four times a day (QID) | INTRAMUSCULAR | Status: DC | PRN

## 2015-07-15 NOTE — Telephone Encounter (Signed)
Spoke with pt's wife. She is aware of CY's recommendation. Rx's have been sent in.

## 2015-07-15 NOTE — ED Notes (Addendum)
Pt remains in mri, family updated,

## 2015-07-15 NOTE — Telephone Encounter (Signed)
Offer prednisone 20 mg, # 5, 1 daily He and his wife are probably dealing with the same thing. Ok to offer both a Z pak if they think they need.

## 2015-07-15 NOTE — ED Notes (Signed)
Pt denies any complaints, pt and family updated on plan of care,

## 2015-07-15 NOTE — ED Notes (Signed)
Family reports that pt has an appointment with [facility] on November 10 for evaluation of dementia, pt also has hx of dysphagia, family reports that pt is not able to eat things such as steak and other items.

## 2015-07-15 NOTE — ED Notes (Signed)
Report to floor,

## 2015-07-15 NOTE — H&P (Signed)
[facility] History and Physical  CHIDUBEM CHAIRES MLY:[id number] DOB: 13-Jul-1934 DOA: 07/15/2015  Referring physician: ER physician PCP: Monico Blitz, MD   Chief Complaint: Altered mental status  HPI: Luis [name] is a 81 y.o. male with history of COPD, hypertension, diabetes mellitus comes to the emergency department with the complaints of altered mental status brought by his family. Per wife patient was doing well well yesterday. Last night before going to bed complained of headache in the bitemporal area, frontal area. However patient went to bed. Woke up this morning being somewhat more confused. Did not eat his breakfast well. As the day progressed patient's confusion continues to get worse. Concerning this patient was brought to the emergency department. Workup in the emergency department patient was found to have sodium of 128. CT head without contrast did not show any acute intracranial abnormality however MRI of the brain showed lacunar infarct in the left external capsule. Patient does not have any focal signs. Is able to answer most of the questions appropriately but slowly. Per family patient has been having cough with productive sputum for quite some time. Patient has been experiencing shortness of breath. Did not complain of any chest pain, lower extremities or swelling, PND or orthopnea. Patient had low oxygen saturations in the 80s concerning this patient was started on 2 L of oxygen through nasal cannula.    Review of Systems:  Constitutional:  No weight loss, night sweats, Fevers, chills, fatigue.  HEENT:  No headaches, Difficulty swallowing,Tooth/dental problems,Sore throat,  No sneezing, itching, ear ache, nasal congestion, post nasal drip,  Cardio-vascular:  No chest pain, Orthopnea, PND, swelling in lower extremities, anasarca, dizziness, palpitations  GI:  No heartburn, indigestion, abdominal pain, nausea, vomiting, diarrhea, change in bowel habits, loss of  appetite  Resp:  No shortness of breath with exertion or at rest. No excess mucus, no productive cough, No non-productive cough, No coughing up of blood.No change in color of mucus.No wheezing.No chest wall deformity  Skin:  no rash or lesions.  GU:  no dysuria, change in color of urine, no urgency or frequency. No flank pain.  Musculoskeletal:  No joint pain or swelling. No decreased range of motion. No back pain.  Psych:  No change in mood or affect. No depression or anxiety. No memory loss.   Past Medical History  Diagnosis Date  . COPD (chronic obstructive pulmonary disease) ([place])   . Hypertension   . Dysphagia   . High cholesterol   . Emphysema   . Emphysema of lung ([place])   . GERD (gastroesophageal reflux disease)   . Heart murmur     as a child  . Exertional shortness of breath   . Diabetes mellitus without complication [facility])    Past Surgical History  Procedure Laterality Date  . Retinal detachment surgery Right 08/29/2010  . Esophageal dilation  2000's    x2  . Eye surgery    . Inguinal hernia repair Right 1970's  . Inguinal hernia repair Bilateral 03/12/2013    w/mesh bilaterally/notes 03/12/2013  . Tonsillectomy  1940's  . Transurethral resection of prostate  2002; 2013  . Cataract extraction w/ intraocular lens implant Left 2011  . Inguinal hernia repair Bilateral 03/12/2013    Procedure: HERNIA REPAIR INGUINAL ADULT BILATERAL;  Surgeon: Merrie Roof, MD;  Location: [facility];  Service: General;  Laterality: Bilateral;  . Insertion of mesh Bilateral 03/12/2013    Procedure: INSERTION OF MESH;  Surgeon: Merrie Roof, MD;  Location: [facility] OR;  Service: General;  Laterality: Bilateral;   Social History:  reports that he quit smoking about 29 years ago. His smoking use included Cigarettes. He has a 70 pack-year smoking history. He quit smokeless tobacco use about 17 years ago. His smokeless tobacco use included Chew. He reports that he drinks about 1.0 oz of alcohol per week.  He reports that he does not use illicit drugs.  No Known Allergies  Family History  Problem Relation Age of Onset  . Stroke Mother      Prior to Admission medications   Medication Sig Start Date End Date Taking? Authorizing Provider  Aclidinium Bromide (TUDORZA PRESSAIR) 400 MCG/ACT AEPB Inhale 1 Inhaler into the lungs 2 (two) times daily.    Yes Historical Provider, MD  ADVAIR HFA 230-21 MCG/ACT inhaler INHALE TWO PUFFS INTO THE LUNGS TWICE DAILY 04/08/15  Yes Deneise Lever, MD  albuterol (PROVENTIL HFA;VENTOLIN HFA) 108 (90 BASE) MCG/ACT inhaler 2 puffs every 4-6 hours as needed Patient taking differently: Inhale 1-2 puffs into the lungs every 4 (four) hours as needed for wheezing or shortness of breath.  04/29/15  Yes Deneise Lever, MD  albuterol (PROVENTIL) (2.5 MG/3ML) 0.083% nebulizer solution Take 3 mLs (2.5 mg total) by nebulization every 6 (six) hours as needed for wheezing or shortness of breath. 06/21/15  Yes Deneise Lever, MD  aspirin 81 MG tablet Take 81 mg by mouth daily.   Yes Historical Provider, MD  B Complex-C (B-COMPLEX WITH VITAMIN C) tablet Take 1 tablet by mouth daily.   Yes Historical Provider, MD  Cholecalciferol (D3 MAXIMUM STRENGTH) 5000 UNITS capsule Take 5,000 Units by mouth daily.   Yes Historical Provider, MD  indapamide (LOZOL) 2.5 MG tablet Take 2.5 mg by mouth every morning.   Yes Historical Provider, MD  omeprazole (PRILOSEC) 20 MG capsule Take 20 mg by mouth daily.   Yes Historical Provider, MD  theophylline (THEODUR) 200 MG 12 hr tablet Take 1 tablet (200 mg total) by mouth 3 (three) times daily. 01/18/15  Yes Deneise Lever, MD  azithromycin (ZITHROMAX Z-PAK) 250 MG tablet Take 2 tablets (500 mg) on  Day 1,  followed by 1 tablet (250 mg) once daily on Days 2 through 5. Patient taking differently: Take 250-500 mg by mouth See admin instructions. Take 2 tablets (500 mg) on  Day 1,  followed by 1 tablet (250 mg) once daily on Days 2 through 5. 07/15/15  07/20/15  Deneise Lever, MD  ipratropium (ATROVENT) 0.06 % nasal spray USE 1 OR 2 SPRAYS IN EACH NOSTRIL UP TO FOUR TIMES DAILY AS NEEDED 02/04/14   Deneise Lever, MD  metFORMIN (GLUCOPHAGE) 500 MG tablet Take 500 mg by mouth daily. 05/23/15   Historical Provider, MD  Multiple Vitamin (MULTIVITAMIN) tablet Take 1 tablet by mouth daily.    Historical Provider, MD  nitroGLYCERIN (NITROSTAT) 0.4 MG SL tablet Place 0.4 mg under the tongue every 5 (five) minutes as needed for chest pain.     Historical Provider, MD  polyethylene glycol (MIRALAX / GLYCOLAX) packet Take 17 g by mouth daily.    Historical Provider, MD  potassium phosphate, monobasic, (K-PHOS ORIGINAL) 500 MG tablet Take 500 mg by mouth daily.     Historical Provider, MD  predniSONE (DELTASONE) 20 MG tablet Take 1 tablet (20 mg total) by mouth daily with breakfast. 07/15/15   Deneise Lever, MD  simvastatin (ZOCOR) 20 MG tablet 1/2 tablet daily. 01/07/13   Historical  Provider, MD  Spacer/Aero-Holding Chambers (AEROCHAMBER MV) inhaler Use as instructed 01/03/15   Deneise Lever, MD  Umeclidinium-Vilanterol [facility] ELLIPTA) 62.5-25 MCG/INH AEPB 1 puff once daily 06/23/15   Deneise Lever, MD   Physical Exam: Filed Vitals:   07/15/15 2128 07/15/15 2130 07/15/15 2200 07/15/15 2352  BP: 168/88 135/78 126/75 117/67  Pulse: 91 89 82 73  Temp:    99 F (37.2 C)  TempSrc:    Oral  Resp: 24 22 19    Height:    5' 10.5" (1.791 m)  Weight:    82.555 kg (182 lb)  SpO2: 98% 98% 95% 96%    Wt Readings from Last 3 Encounters:  07/15/15 82.555 kg (182 lb)  07/15/15 82.555 kg (182 lb)  06/16/15 83.008 kg (183 lb)    General:  Appears calm and comfortable Eyes: PERRL, normal lids, irises & conjunctiva ENT: grossly normal hearing, lips & tongue Neck: no LAD, masses or thyromegaly Cardiovascular: RRR, no m/r/g. No LE edema. Telemetry: SR, no arrhythmias  Respiratory: Bilateral mild coarse breath sounds, prolonged expiratory phase with  occasional wheezing Abdomen: soft, ntnd Skin: no rash or induration seen on limited exam Musculoskeletal: grossly normal tone BUE/BLE Psychiatric: grossly normal mood and affect, speech fluent and appropriate Neurologic: grossly non-focal.          Labs on Admission:  Basic Metabolic Panel:  Recent Labs Lab 07/15/15 1948  NA 128*  K 3.8  CL 87*  CO2 32  GLUCOSE 231*  BUN 17  CREATININE 1.04  CALCIUM 9.2   Liver Function Tests:  Recent Labs Lab 07/15/15 1948  AST 24  ALT 20  ALKPHOS 67  BILITOT 0.7  PROT 7.4  ALBUMIN 4.1   No results for input(s): LIPASE, AMYLASE in the last 168 hours. No results for input(s): AMMONIA in the last 168 hours. CBC:  Recent Labs Lab 07/15/15 1948  WBC 13.2*  NEUTROABS 10.8*  HGB 15.9  HCT 45.0  MCV 91.5  PLT 242   Cardiac Enzymes:  Recent Labs Lab 07/15/15 1948  TROPONINI <0.03    BNP (last 3 results)  Recent Labs  07/15/15 1948  BNP 24.0    ProBNP (last 3 results) No results for input(s): PROBNP in the last 8760 hours.  CBG: No results for input(s): GLUCAP in the last 168 hours.  Radiological Exams on Admission: Dg Chest 1 View  07/15/2015   CLINICAL DATA:  Acute onset of confusion and headache. Altered mental status. Initial encounter.  EXAM: CHEST 1 VIEW  COMPARISON:  Chest radiograph from 01/03/2015  FINDINGS: The lungs are well-aerated and clear. There is no evidence of focal opacification, pleural effusion or pneumothorax.  The cardiomediastinal silhouette is within normal limits. No acute osseous abnormalities are seen.  IMPRESSION: No acute cardiopulmonary process seen.   Electronically Signed   By: Garald Balding M.D.   On: 07/15/2015 21:22   Ct Head Wo Contrast  07/15/2015   CLINICAL DATA:  Headache beginning last night. Confusion beginning today.  EXAM: CT HEAD WITHOUT CONTRAST  TECHNIQUE: Contiguous axial images were obtained from the base of the skull through the vertex without intravenous contrast.   COMPARISON:  MRI 02/22/2005  FINDINGS: The brain shows generalized atrophy with chronic appearing small vessel changes throughout the cerebral hemispheric white matter, progressive over the last decade as expected. No cortical or large vessel territory infarction. No sign of acute infarction, mass lesion, hemorrhage, hydrocephalus or extra-axial collection. No inflammatory sinus disease. There is atherosclerotic calcification  of the major vessels at the base of the brain.  IMPRESSION: Atrophy and chronic small-vessel ischemic changes of the white matter, progressive over the last 10 years. No identifiable acute insult.   Electronically Signed   By: Nelson Chimes M.D.   On: 07/15/2015 20:58   Mr Brain Wo Contrast (neuro Protocol)  07/15/2015   CLINICAL DATA:  Altered mental status.  Confusion and headache.  EXAM: MRI HEAD WITHOUT CONTRAST  TECHNIQUE: Multiplanar, multiecho pulse sequences of the brain and surrounding structures were obtained without intravenous contrast.  COMPARISON:  Head CT 07/15/2015 and MRI 02/22/2005  FINDINGS: There is a 6 mm acute infarct in the region of the left external capsule. There is no evidence of intracranial hemorrhage, mass, midline shift, or extra-axial fluid collection. Mild generalized cerebral atrophy is within normal limits for age. Patchy and confluent T2 hyperintensities in the periventricular greater than subcortical cerebral white matter have greatly progressed from the prior MRI and are nonspecific but compatible with chronic small vessel ischemic disease  Prior right cataract extraction and a right scleral buckle are noted. Paranasal sinuses and mastoid air cells are clear. Major intracranial vascular flow voids are preserved.  IMPRESSION: 1. Acute lacunar infarct in the left external capsule. 2. Extensive chronic small vessel ischemic disease in the cerebral white matter.   Electronically Signed   By: Logan Bores M.D.   On: 07/15/2015 21:25    EKG: Independently  reviewed.  sinus rhythm. Assessment/Plan Principal Problem:   Altered mental status Could be combination of factors: Possible pneumonia, COPD exacerbation, hyponatremia, stroke Admit the patient to the monitored bed Treat underlying pneumonia and COPD exacerbation as patient has elevated white blood cell count with left shift Treat hyponatremia which could be SIADH Follow-up with neuro checks every 4 hours Follow-up with BMP and sodium is corrected  Active Problems:  CVA MRI of the brain shows lacunar infarct Further workup with carotid Dopplers and echocardiogram Keep the patient on aspirin and statin Patient does not have any focal deficits    COPD exacerbation (HCC) Continue the breathing treatments, Solu-Medrol    Hyponatremia Most likely SIADH, could be some component of dehydration Check urine sodium, serum and urine osmolality Follow-up with BMP Continue with IV fluids    Essential hypertension   CVA (cerebral vascular accident) ([place])   Hypoxia    Diabetes mellitus ([place]) Hold metformin Keep the patient on sliding scale insulin  Code Status: Full DVT Prophylaxis: Lovenox Family Communication: Wife and granddaughter at bedside Disposition Plan: Home  Time spent: 42 minutes  [facility] Pager [phone]

## 2015-07-15 NOTE — Telephone Encounter (Signed)
Spoke with pt's wife. Reports increased coughing and wheezing. Cough is productive of thick, yellow mucus. Denies fever. Onset was a few weeks ago. Would like something called in.  No Known Allergies Current Outpatient Prescriptions on File Prior to Visit  Medication Sig Dispense Refill  . Aclidinium Bromide (TUDORZA PRESSAIR) 400 MCG/ACT AEPB Inhale 1 Inhaler into the lungs 2 (two) times daily.     [name] ADVAIR HFA 230-21 MCG/ACT inhaler INHALE TWO PUFFS INTO THE LUNGS TWICE DAILY 36 g 3  . albuterol (PROVENTIL HFA;VENTOLIN HFA) 108 (90 BASE) MCG/ACT inhaler 2 puffs every 4-6 hours as needed 1 Inhaler 11  . albuterol (PROVENTIL) (2.5 MG/3ML) 0.083% nebulizer solution Take 3 mLs (2.5 mg total) by nebulization every 6 (six) hours as needed for wheezing or shortness of breath. 75 mL 5  . aspirin 81 MG tablet Take 81 mg by mouth daily.    . B Complex-C (B-COMPLEX WITH VITAMIN C) tablet Take 1 tablet by mouth daily.    . Cholecalciferol (D3 MAXIMUM STRENGTH) 5000 UNITS capsule Take 5,000 Units by mouth daily.    . fish oil-omega-3 fatty acids 1000 MG capsule 1400 mg daily.    . indapamide (LOZOL) 2.5 MG tablet Take 2.5 mg by mouth every morning.    [name] ipratropium (ATROVENT) 0.06 % nasal spray USE 1 OR 2 SPRAYS IN EACH NOSTRIL UP TO FOUR TIMES DAILY AS NEEDED 120 mL 61  . metFORMIN (GLUCOPHAGE) 500 MG tablet Take 500 mg by mouth daily.  2  . Multiple Vitamin (MULTIVITAMIN) tablet Take 1 tablet by mouth daily.    . nitroGLYCERIN (NITROSTAT) 0.4 MG SL tablet Place 0.4 mg under the tongue every 5 (five) minutes as needed for chest pain.     [name] omeprazole (PRILOSEC) 20 MG capsule Take 20 mg by mouth daily.    . polyethylene glycol (MIRALAX / GLYCOLAX) packet Take 17 g by mouth daily.    . potassium phosphate, monobasic, (K-PHOS ORIGINAL) 500 MG tablet Take 500 mg by mouth daily.     . simvastatin (ZOCOR) 20 MG tablet 1/2 tablet daily.    [name] Spacer/Aero-Holding Chambers (AEROCHAMBER MV) inhaler Use as  instructed 1 each 0  . theophylline (THEODUR) 200 MG 12 hr tablet Take 1 tablet (200 mg total) by mouth 3 (three) times daily. 90 tablet 2  . theophylline (UNIPHYL) 400 MG 24 hr tablet Take 1/2 tablet by mouth three times daily 45 tablet 11  . Umeclidinium-Vilanterol (ANORO ELLIPTA) 62.5-25 MCG/INH AEPB 1 puff once daily 60 each 3   No current facility-administered medications on file prior to visit.    CY - please advise. Thanks

## 2015-07-15 NOTE — ED Notes (Signed)
Hospitalist at bedside,

## 2015-07-15 NOTE — ED Provider Notes (Signed)
CSN: [id number]     Arrival date & time 07/15/15  1913 History   First MD Initiated Contact with Patient 07/15/15 1924     Chief Complaint  Patient presents with  . Altered Mental Status      Patient is a 81 y.o. male presenting with altered mental status. The history is limited by the condition of the patient (AMS).  Altered Mental Status Pt was seen at 1925. Per pt's family and pt: Pt's wife states pt had a frontal-bitemporal headache last night, took tylenol with relief. She and pt woke up this morning at 0730, and pt's wife began to notice that pt "was confused" and "not acting right." Pt has remained confused throughout the day today and started talking about morning events (incorrectly) while sitting at the supper table this evening. Pt's family states pt has appt with [facility] next month for evaluation of dementia. Pt himself states he "is fine." Denies CP/SOB, no abd pain, no N/V/D, no focal motor weakness, no tingling/numbness in extremities, no falls, no syncope, no fevers, no slurred speech, no facial droop. Pt has previous dx of dysphagia.     Past Medical History  Diagnosis Date  . COPD (chronic obstructive pulmonary disease) ([place])   . Hypertension   . Dysphagia   . High cholesterol   . Emphysema   . Emphysema of lung ([place])   . GERD (gastroesophageal reflux disease)   . Heart murmur     as a child  . Exertional shortness of breath   . Diabetes mellitus without complication [facility])    Past Surgical History  Procedure Laterality Date  . Retinal detachment surgery Right 08/29/2010  . Esophageal dilation  2000's    x2  . Eye surgery    . Inguinal hernia repair Right 1970's  . Inguinal hernia repair Bilateral 03/12/2013    w/mesh bilaterally/notes 03/12/2013  . Tonsillectomy  1940's  . Transurethral resection of prostate  2002; 2013  . Cataract extraction w/ intraocular lens implant Left 2011  . Inguinal hernia repair Bilateral 03/12/2013    Procedure: HERNIA  REPAIR INGUINAL ADULT BILATERAL;  Surgeon: Merrie Roof, MD;  Location: [place];  Service: General;  Laterality: Bilateral;  . Insertion of mesh Bilateral 03/12/2013    Procedure: INSERTION OF MESH;  Surgeon: Merrie Roof, MD;  Location: [place];  Service: General;  Laterality: Bilateral;   Family History  Problem Relation Age of Onset  . Stroke Mother    Social History  Substance Use Topics  . Smoking status: Former Smoker -- 2.00 packs/day for 35 years    Types: Cigarettes    Quit date: 10/08/1985  . Smokeless tobacco: Former Systems developer    Types: Chew    Quit date: 10/08/1997  . Alcohol Use: 1.0 oz/week    2 Standard drinks or equivalent per week    Review of Systems  Unable to perform ROS: Mental status change      Allergies  Review of patient's allergies indicates no known allergies.  Home Medications   Prior to Admission medications   Medication Sig Start Date End Date Taking? Authorizing Provider  Aclidinium Bromide (TUDORZA PRESSAIR) 400 MCG/ACT AEPB Inhale 1 Inhaler into the lungs 2 (two) times daily.     Historical Provider, MD  ADVAIR HFA 230-21 MCG/ACT inhaler INHALE TWO PUFFS INTO THE LUNGS TWICE DAILY 04/08/15   Deneise Lever, MD  albuterol (PROVENTIL HFA;VENTOLIN HFA) 108 (90 BASE) MCG/ACT inhaler 2 puffs every 4-6 hours  as needed 04/29/15   Deneise Lever, MD  albuterol (PROVENTIL) (2.5 MG/3ML) 0.083% nebulizer solution Take 3 mLs (2.5 mg total) by nebulization every 6 (six) hours as needed for wheezing or shortness of breath. 06/21/15   Deneise Lever, MD  aspirin 81 MG tablet Take 81 mg by mouth daily.    Historical Provider, MD  azithromycin (ZITHROMAX Z-PAK) 250 MG tablet Take 2 tablets (500 mg) on  Day 1,  followed by 1 tablet (250 mg) once daily on Days 2 through 5. 07/15/15 07/20/15  Deneise Lever, MD  B Complex-C (B-COMPLEX WITH VITAMIN C) tablet Take 1 tablet by mouth daily.    Historical Provider, MD  Cholecalciferol (D3 MAXIMUM STRENGTH) 5000 UNITS capsule  Take 5,000 Units by mouth daily.    Historical Provider, MD  fish oil-omega-3 fatty acids 1000 MG capsule 1400 mg daily.    Historical Provider, MD  indapamide (LOZOL) 2.5 MG tablet Take 2.5 mg by mouth every morning.    Historical Provider, MD  ipratropium (ATROVENT) 0.06 % nasal spray USE 1 OR 2 SPRAYS IN EACH NOSTRIL UP TO FOUR TIMES DAILY AS NEEDED 02/04/14   Deneise Lever, MD  metFORMIN (GLUCOPHAGE) 500 MG tablet Take 500 mg by mouth daily. 05/23/15   Historical Provider, MD  Multiple Vitamin (MULTIVITAMIN) tablet Take 1 tablet by mouth daily.    Historical Provider, MD  nitroGLYCERIN (NITROSTAT) 0.4 MG SL tablet Place 0.4 mg under the tongue every 5 (five) minutes as needed for chest pain.     Historical Provider, MD  omeprazole (PRILOSEC) 20 MG capsule Take 20 mg by mouth daily.    Historical Provider, MD  polyethylene glycol (MIRALAX / GLYCOLAX) packet Take 17 g by mouth daily.    Historical Provider, MD  potassium phosphate, monobasic, (K-PHOS ORIGINAL) 500 MG tablet Take 500 mg by mouth daily.     Historical Provider, MD  predniSONE (DELTASONE) 20 MG tablet Take 1 tablet (20 mg total) by mouth daily with breakfast. 07/15/15   Deneise Lever, MD  simvastatin (ZOCOR) 20 MG tablet 1/2 tablet daily. 01/07/13   Historical Provider, MD  Spacer/Aero-Holding Chambers (AEROCHAMBER MV) inhaler Use as instructed 01/03/15   Deneise Lever, MD  theophylline (THEODUR) 200 MG 12 hr tablet Take 1 tablet (200 mg total) by mouth 3 (three) times daily. 01/18/15   Deneise Lever, MD  theophylline (UNIPHYL) 400 MG 24 hr tablet Take 1/2 tablet by mouth three times daily 03/31/15   Deneise Lever, MD  Umeclidinium-[facility]an[facility] ELLIPTA) 62.5-25 MCG/INH AEPB 1 puff once daily 06/23/15   Deneise Lever, MD   BP 175/110 mmHg  Pulse 88  Temp(Src) 98.5 F (36.9 C) (Oral)  Resp 28  Ht 5\' 11"  (1.803 m)  Wt 182 lb (82.555 kg)  BMI 25.40 kg/m2  SpO2 97%   Filed Vitals:   07/15/15 1926 07/15/15 2000 07/15/15  2004 07/15/15 2128  BP: 175/110 129/80  168/88  Pulse: 88 78  91  Temp: 98.5 F (36.9 C)     TempSrc: Oral     Resp: 28 28  24   Height: 5\' 11"  (1.803 m)     Weight: 182 lb (82.555 kg)     SpO2: 99% 96% 97% 98%     Physical Exam  1930: Physical examination:  Nursing notes reviewed; Vital signs and O2 SAT reviewed;  Constitutional: Well developed, Well nourished, In no acute distress; Head:  Normocephalic, atraumatic; Eyes: EOMI, PERRL, No scleral icterus; ENMT:  Mouth and pharynx normal, Mucous membranes dry; Neck: Supple, Full range of motion, No lymphadenopathy; Cardiovascular: Regular rate and rhythm, No gallop; Respiratory: Breath sounds coarse & equal bilaterally, No wheezes. +moist cough during exam. Speaking full sentences with ease, Tachypneic after moving to stretcher from wheelchair.; Chest: Nontender, Movement normal; Abdomen: Soft, Nontender, Nondistended, Normal bowel sounds; Genitourinary: No CVA tenderness; Extremities: Pulses normal, No tenderness, No edema, No calf edema or asymmetry.; Neuro: Awake, alert, confused re: events. Major CN grossly intact. No facial droop. Speech clear. Grips equal. Strength 5/5 equal bilat UE's and LE's.  DTR 2/4 equal bilat UE's and LE's.  No gross sensory deficits.  Pt has difficulty understanding to follow commands to perform cerebellar testing bilat UE's (finger-nose) and LE's (heel-shin)..; Skin: Color normal, Warm, Dry.   ED Course  Procedures (including critical care time) Labs Review   Imaging Review  I have personally reviewed and evaluated these images and lab results as part of my medical decision-making.   EKG Interpretation   Date/Time:  Friday July 15 2015 19:33:24 EDT Ventricular Rate:  84 PR Interval:  215 QRS Duration: 99 QT Interval:  366 QTC Calculation: 433 R Axis:   47 Text Interpretation:  Sinus rhythm with 1st degree A-V block Atrial  premature complex Nonspecific T wave abnormality Lateral leads Baseline   wander Artifact When compared with ECG of 03/11/2015 No significant change  was found Confirmed by [facility]  MD, Nunzio Cory [phone]) on 07/15/2015 7:45:40  PM      MDM  MDM Reviewed: previous chart, nursing note and vitals Reviewed previous: labs and ECG Interpretation: labs, ECG, x-ray, MRI and CT scan Total time providing critical care: 30-74 minutes. This excludes time spent performing separately reportable procedures and services. Consults: admitting MD    CRITICAL CARE Performed by: Alfonzo Feller Total critical care time: 35 Critical care time was exclusive of separately billable procedures and treating other patients. Critical care was necessary to treat or prevent imminent or life-threatening deterioration. Critical care was time spent personally by me on the following activities: development of treatment plan with patient and/or surrogate as well as nursing, discussions with consultants, evaluation of patient's response to treatment, examination of patient, obtaining history from patient or surrogate, ordering and performing treatments and interventions, ordering and review of laboratory studies, ordering and review of radiographic studies, pulse oximetry and re-evaluation of patient's condition.    Results for orders placed or performed during the hospital encounter of 07/15/15  Ethanol  Result Value Ref Range   Alcohol, Ethyl (B) <5 <5 mg/dL  Protime-INR  Result Value Ref Range   Prothrombin Time 14.4 11.6 - 15.2 seconds   INR 1.10 0.00 - 1.49  APTT  Result Value Ref Range   aPTT 26 24 - 37 seconds  CBC  Result Value Ref Range   WBC 13.2 (H) 4.0 - 10.5 K/uL   RBC 4.92 4.22 - 5.81 MIL/uL   Hemoglobin 15.9 13.0 - 17.0 g/dL   HCT 45.0 39.0 - 52.0 %   MCV 91.5 78.0 - 100.0 fL   MCH 32.3 26.0 - 34.0 pg   MCHC 35.3 30.0 - 36.0 g/dL   RDW 12.5 11.5 - 15.5 %   Platelets 242 150 - 400 K/uL  Differential  Result Value Ref Range   Neutrophils Relative % 82 %   Neutro  Abs 10.8 (H) 1.7 - 7.7 K/uL   Lymphocytes Relative 9 %   Lymphs Abs 1.2 0.7 - 4.0 K/uL   Monocytes Relative  8 %   Monocytes Absolute 1.0 0.1 - 1.0 K/uL   Eosinophils Relative 1 %   Eosinophils Absolute 0.1 0.0 - 0.7 K/uL   Basophils Relative 0 %   Basophils Absolute 0.0 0.0 - 0.1 K/uL  Comprehensive metabolic panel  Result Value Ref Range   Sodium 128 (L) 135 - 145 mmol/L   Potassium 3.8 3.5 - 5.1 mmol/L   Chloride 87 (L) 101 - 111 mmol/L   CO2 32 22 - 32 mmol/L   Glucose, Bld 231 (H) 65 - 99 mg/dL   BUN 17 6 - 20 mg/dL   Creatinine, Ser 1.04 0.61 - 1.24 mg/dL   Calcium 9.2 8.9 - 10.3 mg/dL   Total Protein 7.4 6.5 - 8.1 g/dL   Albumin 4.1 3.5 - 5.0 g/dL   AST 24 15 - 41 U/L   ALT 20 17 - 63 U/L   Alkaline Phosphatase 67 38 - 126 U/L   Total Bilirubin 0.7 0.3 - 1.2 mg/dL   GFR calc non Af Amer >60 >60 mL/min   GFR calc Af Amer >60 >60 mL/min   Anion gap 9 5 - 15  Urine rapid drug screen (hosp performed)not at [facility]  Result Value Ref Range   Opiates NONE DETECTED NONE DETECTED   Cocaine NONE DETECTED NONE DETECTED   Benzodiazepines NONE DETECTED NONE DETECTED   Amphetamines NONE DETECTED NONE DETECTED   Tetrahydrocannabinol NONE DETECTED NONE DETECTED   Barbiturates NONE DETECTED NONE DETECTED  Urinalysis, Routine w reflex microscopic (not at [facility])  Result Value Ref Range   Color, Urine YELLOW YELLOW   APPearance CLEAR CLEAR   Specific Gravity, Urine 1.015 1.005 - 1.030   pH 7.0 5.0 - 8.0   Glucose, UA NEGATIVE NEGATIVE mg/dL   Hgb urine dipstick TRACE (A) NEGATIVE   Bilirubin Urine NEGATIVE NEGATIVE   Ketones, ur NEGATIVE NEGATIVE mg/dL   Protein, ur NEGATIVE NEGATIVE mg/dL   Urobilinogen, UA 0.2 0.0 - 1.0 mg/dL   Nitrite NEGATIVE NEGATIVE   Leukocytes, UA NEGATIVE NEGATIVE  Troponin I  Result Value Ref Range   Troponin I <0.03 <0.031 ng/mL  Blood gas, arterial  Result Value Ref Range   O2 Content 2.0 L/min   Delivery systems NASAL CANNULA    pH, Arterial 7.466  (H) 7.350 - 7.450   pCO2 arterial 37.8 35.0 - 45.0 mmHg   pO2, Arterial 88.3 80.0 - 100.0 mmHg   Bicarbonate 27.6 (H) 20.0 - 24.0 mEq/L   Acid-Base Excess 3.4 (H) 0.0 - 2.0 mmol/L   O2 Saturation 96.9 %   Patient temperature 98.6    Collection site BRACHIAL ARTERY    Drawn by [id number]    Sample type ARTERIAL    Allens test (pass/fail) NOT INDICATED (A) PASS  Brain natriuretic peptide  Result Value Ref Range   B Natriuretic Peptide 24.0 0.0 - 100.0 pg/mL  Urine microscopic-add on  Result Value Ref Range   Squamous Epithelial / LPF RARE RARE   WBC, UA 0-2 <3 WBC/hpf   RBC / HPF 0-2 <3 RBC/hpf   Bacteria, UA RARE RARE   Dg Chest 1 View 07/15/2015   CLINICAL DATA:  Acute onset of confusion and headache. Altered mental status. Initial encounter.  EXAM: CHEST 1 VIEW  COMPARISON:  Chest radiograph from 01/03/2015  FINDINGS: The lungs are well-aerated and clear. There is no evidence of focal opacification, pleural effusion or pneumothorax.  The cardiomediastinal silhouette is within normal limits. No acute osseous abnormalities are seen.  IMPRESSION:  No acute cardiopulmonary process seen.   Electronically Signed   By: Garald Balding M.D.   On: 07/15/2015 21:22   Ct Head Wo Contrast 07/15/2015   CLINICAL DATA:  Headache beginning last night. Confusion beginning today.  EXAM: CT HEAD WITHOUT CONTRAST  TECHNIQUE: Contiguous axial images were obtained from the base of the skull through the vertex without intravenous contrast.  COMPARISON:  MRI 02/22/2005  FINDINGS: The brain shows generalized atrophy with chronic appearing small vessel changes throughout the cerebral hemispheric white matter, progressive over the last decade as expected. No cortical or large vessel territory infarction. No sign of acute infarction, mass lesion, hemorrhage, hydrocephalus or extra-axial collection. No inflammatory sinus disease. There is atherosclerotic calcification of the major vessels at the base of the brain.  IMPRESSION:  Atrophy and chronic small-vessel ischemic changes of the white matter, progressive over the last 10 years. No identifiable acute insult.   Electronically Signed   By: Nelson Chimes M.D.   On: 07/15/2015 20:58   Mr Brain Wo Contrast (neuro Protocol) 07/15/2015   CLINICAL DATA:  Altered mental status.  Confusion and headache.  EXAM: MRI HEAD WITHOUT CONTRAST  TECHNIQUE: Multiplanar, multiecho pulse sequences of the brain and surrounding structures were obtained without intravenous contrast.  COMPARISON:  Head CT 07/15/2015 and MRI 02/22/2005  FINDINGS: There is a 6 mm acute infarct in the region of the left external capsule. There is no evidence of intracranial hemorrhage, mass, midline shift, or extra-axial fluid collection. Mild generalized cerebral atrophy is within normal limits for age. Patchy and confluent T2 hyperintensities in the periventricular greater than subcortical cerebral white matter have greatly progressed from the prior MRI and are nonspecific but compatible with chronic small vessel ischemic disease  Prior right cataract extraction and a right scleral buckle are noted. Paranasal sinuses and mastoid air cells are clear. Major intracranial vascular flow voids are preserved.  IMPRESSION: 1. Acute lacunar infarct in the left external capsule. 2. Extensive chronic small vessel ischemic disease in the cerebral white matter.   Electronically Signed   By: Logan Bores M.D.   On: 07/15/2015 21:25    2130:  Pt not code stroke: pt woke up with symptoms, LKW last night. Pt tachypneic with coarse lung sounds on arrival to ED, with O2 Sat 90% R/A: short neb given with improvement. Sats remain 98% on O2 2L N/C. Hyponatremia on labs today; judicious IVF given. No change in neuro assessment.  Dx and testing d/w pt and family.  Questions answered.  Verb understanding, agreeable to admit.  T/C to [facility] Dr. Louanne Skye, case discussed, including:  HPI, pertinent PM/SHx, VS/PE, dx testing, ED course and treatment:   Agreeable to admit, requests she will come to the ED for evaluation.   Francine Graven, DO 07/20/15 1524

## 2015-07-15 NOTE — ED Notes (Addendum)
Pt arrived to er with family for further evaluation of confusion, wife reports that pt had started complaining of headache last night, was given tylenol, pt went to bed, wife reports that pt has been having increased episodes of altered mental status, worse this evening at supper, pt arrived to er, resp rate increased, pulse ox 90% RA, pt denies any pain,  Dr Thurnell Garbe at bedside, pt placed on oxygen at 2lpm via [place] with improvement in pulse ox 100%, pt has problems completing his  [facility] stroke screen due to not being able to complete task asked of pt,

## 2015-07-16 ENCOUNTER — Inpatient Hospital Stay (HOSPITAL_COMMUNITY): Payer: Medicare Other

## 2015-07-16 DIAGNOSIS — I10 Essential (primary) hypertension: Secondary | ICD-10-CM

## 2015-07-16 DIAGNOSIS — J441 Chronic obstructive pulmonary disease with (acute) exacerbation: Secondary | ICD-10-CM

## 2015-07-16 DIAGNOSIS — I639 Cerebral infarction, unspecified: Principal | ICD-10-CM

## 2015-07-16 DIAGNOSIS — E871 Hypo-osmolality and hyponatremia: Secondary | ICD-10-CM

## 2015-07-16 DIAGNOSIS — I6789 Other cerebrovascular disease: Secondary | ICD-10-CM

## 2015-07-16 DIAGNOSIS — G9340 Encephalopathy, unspecified: Secondary | ICD-10-CM

## 2015-07-16 LAB — BASIC METABOLIC PANEL
Anion gap: 8 (ref 5–15)
BUN: 13 mg/dL (ref 6–20)
CALCIUM: 8.8 mg/dL — AB (ref 8.9–10.3)
CO2: 31 mmol/L (ref 22–32)
Chloride: 88 mmol/L — ABNORMAL LOW (ref 101–111)
Creatinine, Ser: 0.91 mg/dL (ref 0.61–1.24)
GFR calc Af Amer: >60 mL/min (ref >60)
GLUCOSE: 185 mg/dL — AB (ref 65–99)
Potassium: 3 mmol/L — ABNORMAL LOW (ref 3.5–5.1)
SODIUM: 127 mmol/L — AB (ref 135–145)

## 2015-07-16 LAB — CREATININE, SERUM
CREATININE: 0.96 mg/dL (ref 0.61–1.24)
GFR calc non Af Amer: >60 mL/min (ref >60)

## 2015-07-16 LAB — CBC
HCT: 41.8 % (ref 39.0–52.0)
HCT: 42.7 % (ref 39.0–52.0)
HEMOGLOBIN: 14.6 g/dL (ref 13.0–17.0)
Hemoglobin: 15 g/dL (ref 13.0–17.0)
MCH: 31.6 pg (ref 26.0–34.0)
MCH: 31.9 pg (ref 26.0–34.0)
MCHC: 34.9 g/dL (ref 30.0–36.0)
MCHC: 35.1 g/dL (ref 30.0–36.0)
MCV: 90.5 fL (ref 78.0–100.0)
MCV: 90.9 fL (ref 78.0–100.0)
PLATELETS: 231 10*3/uL (ref 150–400)
PLATELETS: 236 10*3/uL (ref 150–400)
RBC: 4.62 MIL/uL (ref 4.22–5.81)
RBC: 4.7 MIL/uL (ref 4.22–5.81)
RDW: 12.4 % (ref 11.5–15.5)
RDW: 12.5 % (ref 11.5–15.5)
WBC: 12.4 10*3/uL — AB (ref 4.0–10.5)
WBC: 9.7 10*3/uL (ref 4.0–10.5)

## 2015-07-16 LAB — OSMOLALITY: Osmolality: 270 mosm/kg — ABNORMAL LOW (ref 275–300)

## 2015-07-16 LAB — TSH: TSH: 2.056 u[IU]/mL (ref 0.350–4.500)

## 2015-07-16 LAB — LIPID PANEL
CHOLESTEROL: 164 mg/dL (ref 0–200)
HDL: 49 mg/dL (ref >40)
LDL Cholesterol: 88 mg/dL (ref 0–99)
TRIGLYCERIDES: 137 mg/dL (ref <150)
Total CHOL/HDL Ratio: 3.3 RATIO
VLDL: 27 mg/dL (ref 0–40)

## 2015-07-16 LAB — SODIUM, URINE, RANDOM: Sodium, Ur: 81 mmol/L

## 2015-07-16 MED ORDER — DEXTROSE 5 % IV SOLN
INTRAVENOUS | Status: AC
  Filled 2015-07-16: qty 10

## 2015-07-16 MED ORDER — SIMVASTATIN 20 MG PO TABS
20.0000 mg | ORAL_TABLET | Freq: Every day | ORAL | Status: DC
  Administered 2015-07-16 – 2015-07-17 (×2): 20 mg via ORAL
  Filled 2015-07-16 (×2): qty 1

## 2015-07-16 MED ORDER — POTASSIUM CHLORIDE CRYS ER 20 MEQ PO TBCR
40.0000 meq | EXTENDED_RELEASE_TABLET | ORAL | Status: AC
Start: 2015-07-16 — End: 2015-07-16
  Administered 2015-07-16 (×2): 40 meq via ORAL
  Filled 2015-07-16 (×2): qty 2

## 2015-07-16 MED ORDER — THEOPHYLLINE ER 200 MG PO TB12
200.0000 mg | ORAL_TABLET | Freq: Three times a day (TID) | ORAL | Status: DC
  Administered 2015-07-16 – 2015-07-17 (×5): 200 mg via ORAL
  Filled 2015-07-16 (×5): qty 1

## 2015-07-16 MED ORDER — AZITHROMYCIN 500 MG IV SOLR
INTRAVENOUS | Status: AC
  Filled 2015-07-16: qty 500

## 2015-07-16 MED ORDER — SIMVASTATIN 10 MG PO TABS: 10.0000 mg | ORAL_TABLET | Freq: Every day | ORAL | Status: DC

## 2015-07-16 MED ORDER — IPRATROPIUM-ALBUTEROL 0.5-2.5 (3) MG/3ML IN SOLN
3.0000 mL | Freq: Four times a day (QID) | RESPIRATORY_TRACT | Status: DC
  Administered 2015-07-17 – 2015-07-18 (×5): 3 mL via RESPIRATORY_TRACT
  Filled 2015-07-16 (×5): qty 3

## 2015-07-16 MED ORDER — PANTOPRAZOLE SODIUM 40 MG PO TBEC
40.0000 mg | DELAYED_RELEASE_TABLET | Freq: Every day | ORAL | Status: DC
  Administered 2015-07-16 – 2015-07-17 (×2): 40 mg via ORAL
  Filled 2015-07-16 (×2): qty 1

## 2015-07-16 MED ORDER — MOMETASONE FURO-FORMOTEROL FUM 200-5 MCG/ACT IN AERO
2.0000 | INHALATION_SPRAY | Freq: Two times a day (BID) | RESPIRATORY_TRACT | Status: DC
  Administered 2015-07-16 – 2015-07-18 (×4): 2 via RESPIRATORY_TRACT
  Filled 2015-07-16: qty 8.8

## 2015-07-16 MED ORDER — POLYETHYLENE GLYCOL 3350 17 G PO PACK
17.0000 g | PACK | Freq: Every day | ORAL | Status: DC
  Administered 2015-07-16 – 2015-07-17 (×2): 17 g via ORAL
  Filled 2015-07-16 (×2): qty 1

## 2015-07-16 NOTE — Progress Notes (Signed)
Patient still has slight confusion, he argued some about dulera stating he had one at home and did not wish to pay for this one. Son talked him to taking dulera.

## 2015-07-16 NOTE — Progress Notes (Signed)
Pt arrived on floor  around midnight with wife at bedside.  No complaints and rested well throughout the night.

## 2015-07-16 NOTE — Progress Notes (Signed)
Notified Dr. Roderic Palau to discuss the patients RN swallow eval.  It was reported to me that the patient failed his screening due to hx of dysphagia, but was placed on a carb modified diet.  I placed the patient on NPO and redone the swallow screen since the nurse reported they had crackers, the screening that was done was confusing to me.  Discussed the plan of care with the patient and his son.  The son voiced that the patient has had a history with dysphagia and at home eats foods such as meat loaf, and does not have a problem.  Voiced the pt/family discussion and that he passed the screening otherwise. AEB clear lungs pre/post screening and no cough or choking after water or crackers to Dr. Roderic Palau.  New orders to start the patient on his home diet and for SLP.

## 2015-07-16 NOTE — Progress Notes (Signed)
[facility] PROGRESS NOTE  Luis [name] VHQ:[id number] DOB: Nov 26, 1933 DOA: 07/15/2015 PCP: Monico Blitz, MD  Assessment/Plan: 1. Acute encephalopathy, etiology unclear. CXR unremarkable. WBC now WNL. UA does not show any signs of infection, Urine tox screen is unremarkable. ABG shows normal PCO2. Encephalopathy is likely multifactorial in the setting of CVA/ possible mild hypoxia with COPD. Appears to be improving.  2. Acute CVA. MRI of the brain reveals acute lacunar infarct in the left external capsule. Carotid Dopplers unremarkable. ECHO pending. Continue ASA. LDL 88. Will increase Simvastatin to 20mg  daily. Will check HgbA1C. ST/PT has been ordered.  Patient likely has an element of chronic dysphagia and family reports he has been eating soft foods at home.  3. COPD exacerbation. CXR unremarkable. Continue steroids, abx, and bronchodilators. Will resume Advair and Theophylline.  4. Hyponatremia, sodium 127.  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 Continue to replete with IVF but will place on free water restriction. Urine sodium 81 5. Hypokalemia, will replete.  6. Essential hypertension, improved. Continue to monitor.  7. DM type 2, stable. Continue SSI.   Code Status: Full DVT Prophylaxis: Lovenox Family Communication: Family at bedside. Discussed with patient who understands and has no concerns at this time. Disposition Plan: Discharge when improved.   Consultants:    Procedures:    Antibiotics:  Azithromycin 10/8  Rocephin 10/8  HPI/Subjective: Feels okay. Has an appetite. Reports chronic SOB, a recent cough, and wheezing. Also has some dizziness upon standing. He denies any fever.   Per wife, patient is somewhat better however still seems confused.   Objective: Filed Vitals:   07/16/15 0600  BP: 136/87  Pulse: 74  Temp: 98.2 F (36.8 C)  Resp: 20    Intake/Output Summary (Last 24 hours) at 07/16/15 0719 Last data filed at 07/16/15 4132  Gross per 24  hour  Intake      0 ml  Output    850 ml  Net   -850 ml   Filed Weights   07/15/15 1926 07/15/15 2352  Weight: 82.555 kg (182 lb) 82.555 kg (182 lb)    Exam:  General: NAD, looks comfortable Cardiovascular: RRR, S1, S2  Respiratory: coarse breath sounds bilaterally that clear with coughing. Mild expiratory wheeze, mostly in the upper lung fields Abdomen: soft, non tender, no distention , bowel sounds normal Musculoskeletal: No edema b/l Neurological: No focal deficits, grossly intact  Data Reviewed: Basic Metabolic Panel:  Recent Labs Lab 07/15/15 1948 07/16/15 0021 07/16/15 0603  NA 128*  --  127*  K 3.8  --  3.0*  CL 87*  --  88*  CO2 32  --  31  GLUCOSE 231*  --  185*  BUN 17  --  13  CREATININE 1.04 0.96 0.91  CALCIUM 9.2  --  8.8*   Liver Function Tests:  Recent Labs Lab 07/15/15 1948  AST 24  ALT 20  ALKPHOS 67  BILITOT 0.7  PROT 7.4  ALBUMIN 4.1    CBC:  Recent Labs Lab 07/15/15 1948 07/16/15 0021 07/16/15 0603  WBC 13.2* 12.4* 9.7  NEUTROABS 10.8*  --   --   HGB 15.9 14.6 15.0  HCT 45.0 41.8 42.7  MCV 91.5 90.5 90.9  PLT 242 236 231   Cardiac Enzymes:  Recent Labs Lab 07/15/15 1948  TROPONINI <0.03   BNP (last 3 results)  Recent Labs  07/15/15 1948  BNP 24.0      Studies: Dg Chest 1 View  07/15/2015   CLINICAL  DATA:  Acute onset of confusion and headache. Altered mental status. Initial encounter.  EXAM: CHEST 1 VIEW  COMPARISON:  Chest radiograph from 01/03/2015  FINDINGS: The lungs are well-aerated and clear. There is no evidence of focal opacification, pleural effusion or pneumothorax.  The cardiomediastinal silhouette is within normal limits. No acute osseous abnormalities are seen.  IMPRESSION: No acute cardiopulmonary process seen.   Electronically Signed   By: Garald Balding M.D.   On: 07/15/2015 21:22   Ct Head Wo Contrast  07/15/2015   CLINICAL DATA:  Headache beginning last night. Confusion beginning today.  EXAM: CT  HEAD WITHOUT CONTRAST  TECHNIQUE: Contiguous axial images were obtained from the base of the skull through the vertex without intravenous contrast.  COMPARISON:  MRI 02/22/2005  FINDINGS: The brain shows generalized atrophy with chronic appearing small vessel changes throughout the cerebral hemispheric white matter, progressive over the last decade as expected. No cortical or large vessel territory infarction. No sign of acute infarction, mass lesion, hemorrhage, hydrocephalus or extra-axial collection. No inflammatory sinus disease. There is atherosclerotic calcification of the major vessels at the base of the brain.  IMPRESSION: Atrophy and chronic small-vessel ischemic changes of the white matter, progressive over the last 10 years. No identifiable acute insult.   Electronically Signed   By: Nelson Chimes M.D.   On: 07/15/2015 20:58   Mr Brain Wo Contrast (neuro Protocol)  07/15/2015   CLINICAL DATA:  Altered mental status.  Confusion and headache.  EXAM: MRI HEAD WITHOUT CONTRAST  TECHNIQUE: Multiplanar, multiecho pulse sequences of the brain and surrounding structures were obtained without intravenous contrast.  COMPARISON:  Head CT 07/15/2015 and MRI 02/22/2005  FINDINGS: There is a 6 mm acute infarct in the region of the left external capsule. There is no evidence of intracranial hemorrhage, mass, midline shift, or extra-axial fluid collection. Mild generalized cerebral atrophy is within normal limits for age. Patchy and confluent T2 hyperintensities in the periventricular greater than subcortical cerebral white matter have greatly progressed from the prior MRI and are nonspecific but compatible with chronic small vessel ischemic disease  Prior right cataract extraction and a right scleral buckle are noted. Paranasal sinuses and mastoid air cells are clear. Major intracranial vascular flow voids are preserved.  IMPRESSION: 1. Acute lacunar infarct in the left external capsule. 2. Extensive chronic small  vessel ischemic disease in the cerebral white matter.   Electronically Signed   By: Logan Bores M.D.   On: 07/15/2015 21:25    Scheduled Meds: . aspirin  81 mg Oral Daily  . azithromycin  500 mg Intravenous Q24H  . cefTRIAXone (ROCEPHIN)  IV  1 g Intravenous Q24H  . enoxaparin (LOVENOX) injection  40 mg Subcutaneous Q24H  . ipratropium-albuterol  3 mL Nebulization Q6H  . methylPREDNISolone (SOLU-MEDROL) injection  60 mg Intravenous Q6H   Continuous Infusions: . sodium chloride 100 mL/hr at 07/16/15 0116    Principal Problem:   Altered mental status Active Problems:   COPD exacerbation (HCC)   Hyponatremia   Essential hypertension   CVA (cerebral vascular accident) ([place])   Hypoxia   Diabetes mellitus ([place])   Time spent: 25 minutes   Jehanzeb Memon. MD  [facility] Pager [phone] If 7PM-7AM, please contact night-coverage at [email or web address], password [facility] 07/16/2015, 7:19 AM  LOS: 1 day     By signing my name below, I, Rosalie Doctor, attest that this documentation has been prepared under the direction and in the presence of [facility]. MD Electronically  Signed: Rosalie Doctor, Scribe. 07/16/2015 12:31pm    I, Dr. Kathie Dike, personally performed the services described in this documentaiton. All medical record entries made by the scribe were at my direction and in my presence. I have reviewed the chart and agree that the record reflects my personal performance and is accurate and complete  Kathie Dike, MD, 07/16/2015 1:04 PM

## 2015-07-16 NOTE — Progress Notes (Signed)
  Echocardiogram 2D Echocardiogram has been performed.  Lysle Rubens 07/16/2015, 8:54 AM

## 2015-07-17 DIAGNOSIS — I63239 Cerebral infarction due to unspecified occlusion or stenosis of unspecified carotid arteries: Secondary | ICD-10-CM

## 2015-07-17 LAB — BASIC METABOLIC PANEL
ANION GAP: 8 (ref 5–15)
BUN: 17 mg/dL (ref 6–20)
CALCIUM: 9 mg/dL (ref 8.9–10.3)
CO2: 28 mmol/L (ref 22–32)
Chloride: 96 mmol/L — ABNORMAL LOW (ref 101–111)
Creatinine, Ser: 0.81 mg/dL (ref 0.61–1.24)
GLUCOSE: 189 mg/dL — AB (ref 65–99)
Potassium: 3.6 mmol/L (ref 3.5–5.1)
Sodium: 132 mmol/L — ABNORMAL LOW (ref 135–145)

## 2015-07-17 LAB — CBC
HCT: 41.4 % (ref 39.0–52.0)
HEMOGLOBIN: 14.2 g/dL (ref 13.0–17.0)
MCH: 31.2 pg (ref 26.0–34.0)
MCHC: 34.3 g/dL (ref 30.0–36.0)
MCV: 91 fL (ref 78.0–100.0)
PLATELETS: 244 10*3/uL (ref 150–400)
RBC: 4.55 MIL/uL (ref 4.22–5.81)
RDW: 12.5 % (ref 11.5–15.5)
WBC: 14.7 10*3/uL — ABNORMAL HIGH (ref 4.0–10.5)

## 2015-07-17 LAB — GLUCOSE, CAPILLARY
GLUCOSE-CAPILLARY: 160 mg/dL — AB (ref 65–99)
Glucose-Capillary: 159 mg/dL — ABNORMAL HIGH (ref 65–99)

## 2015-07-17 LAB — OSMOLALITY, URINE: OSMOLALITY UR: 593 mosm/kg (ref 390–1090)

## 2015-07-17 MED ORDER — METHYLPREDNISOLONE SODIUM SUCC 125 MG IJ SOLR
60.0000 mg | Freq: Two times a day (BID) | INTRAMUSCULAR | Status: DC
  Administered 2015-07-17 – 2015-07-18 (×2): 60 mg via INTRAVENOUS
  Filled 2015-07-17 (×2): qty 2

## 2015-07-17 NOTE — Discharge Summary (Signed)
Physician Discharge Summary  Luis [name] EXN:[id number] DOB: January 16, 1934 DOA: 07/15/2015  PCP: Monico Blitz, MD  Admit date: 07/15/2015 Discharge date: 07/18/2015  Time spent: 35 minutes  Recommendations for Outpatient Follow-up:  1. Follow-up with PCP within 1-2 weeks.  2. Follow-up with neurology appointment as scheduled.   Discharge Diagnoses:  Principal Problem:   Acute Encephalopathy Active Problems:   COPD exacerbation ([place])   Acute respiratory failure with hypoxia   Hyponatremia   Essential hypertension   Acute CVA (cerebral vascular accident) ([place])   Diabetes mellitus ([place])   Hypokalemia   Discharge Condition: Improved   Diet recommendation: dysphagia 3 diet with thin liquids  Filed Weights   07/15/15 1926 07/15/15 2352  Weight: 82.555 kg (182 lb) 82.555 kg (182 lb)    History of present illness:  51 yom with history of COPD, hypertension and DM presented with complaints of headache and acute encephalopathy reported by family. Initial workup in the ED revealed hyponatremia, sodium 128 and MRI brain revealed lacunar infarct in the left external capsule. Patient was also found to be hypoxic with oxygen saturations in the 80's and started on 2L [place]. Admitted for further evaluation and monitoring.   Hospital Course:  Etiology of acute encephalopathy likely multifactorial related to hypoxia/acute CVA. Patient also has an element of early stages of dementia. UA and urine tox screen were both unremarkable. He had mild confusion that increased at night but overalll remained alert and oriented during hospitalization. With treatment and observation, his mental status has improved back to baseline. Wife scheduled an appointment prior to hospitalization with Dr. Delice Lesch, neurology, on 11/10.  Acute CVA was revealed on the MRI of the brain. MRI of the brain revealed acute lacunar infarct in the left external capsule. Carotid Dopplers unremarkable. ECHO results as below. Simvastatin  was increased to 20mg  daily, due to LDL of 88. Aspirin was increased from 81mg  daily to 325mg  daily. Patient did not have any residual neurology deficits and did not need further physical therapy evaluations. He was seen by speech therapy to evaluate for possible dysphagia, but performed fairly well without signs of aspiration. Recommendations were for dysphagia 3 diet with thin liquids.    COPD exacerbation. Treated with IV steroids, abx and bronchodilators. CXR unremarkable. He clinically improved through his hospital stay and has been transitioned to prednisone taper. He has been weaned off oxygen and is breathing comfortably on room air. Advair and Theophylline have been resumed. Follow up with his outpatient pulmonology   Hyponatremia. Suspect this is related to indapamide use. This has corrected with IV fluids. Would hold off on further diuretic use for now  Hypokalemia, resolved.   Essential hypertension, improved.   DM type 2, stable. Will continue SSI.  Procedures:    Consultations:  Speech therapy  Discharge Exam: Filed Vitals:   07/18/15 0524  BP: 162/85  Pulse: 65  Temp: 98.2 F (36.8 C)  Resp: 20    General: NAD, looks comfortable  Cardiovascular: RRR, S1, S2   Respiratory: Mild wheeze bilaterally  Abdomen: soft, non tender, no distention , bowel sounds normal  Musculoskeletal: No edema b/l  Discharge Instructions   Discharge Instructions    Diet - low sodium heart healthy    Complete by:  As directed      Increase activity slowly    Complete by:  As directed           Current Discharge Medication List    START taking these medications   Details  aspirin EC 325 MG tablet Take 1 tablet (325 mg total) by mouth daily. Qty: 30 tablet, Refills: 0      CONTINUE these medications which have CHANGED   Details  predniSONE (DELTASONE) 10 MG tablet Take 40mg  po daily for 2 days then 30mg  daily for 2 days then 20mg  daily for 2 days then 10mg  daily  for 2 days then stop Qty: 20 tablet, Refills: 0    simvastatin (ZOCOR) 20 MG tablet Take 1 tablet (20 mg total) by mouth daily at 6 PM. 1/2 tablet daily. Qty: 30 tablet, Refills: 1      CONTINUE these medications which have NOT CHANGED   Details  Aclidinium Bromide (TUDORZA PRESSAIR) 400 MCG/ACT AEPB Inhale 1 Inhaler into the lungs 2 (two) times daily.     ADVAIR HFA 230-21 MCG/ACT inhaler INHALE TWO PUFFS INTO THE LUNGS TWICE DAILY Qty: 36 g, Refills: 3    albuterol (PROVENTIL HFA;VENTOLIN HFA) 108 (90 BASE) MCG/ACT inhaler 2 puffs every 4-6 hours as needed Qty: 1 Inhaler, Refills: 11    albuterol (PROVENTIL) (2.5 MG/3ML) 0.083% nebulizer solution Take 3 mLs (2.5 mg total) by nebulization every 6 (six) hours as needed for wheezing or shortness of breath. Qty: 75 mL, Refills: 5    B Complex-C (B-COMPLEX WITH VITAMIN C) tablet Take 1 tablet by mouth daily.    Cholecalciferol (D3 MAXIMUM STRENGTH) 5000 UNITS capsule Take 5,000 Units by mouth daily.    ipratropium (ATROVENT) 0.06 % nasal spray USE 1 OR 2 SPRAYS IN EACH NOSTRIL UP TO FOUR TIMES DAILY AS NEEDED Qty: 120 mL, Refills: 61    metFORMIN (GLUCOPHAGE) 500 MG tablet Take 500 mg by mouth daily. Refills: 2    omeprazole (PRILOSEC) 20 MG capsule Take 20 mg by mouth daily.    polyethylene glycol (MIRALAX / GLYCOLAX) packet Take 17 g by mouth daily.    PREVIDENT 5000 BOOSTER PLUS 1.1 % PSTE 3 (three) times daily. as directed Refills: 99    theophylline (UNIPHYL) 400 MG 24 hr tablet Take 200 mg by mouth 3 (three) times daily. Refills: 11    azithromycin (ZITHROMAX Z-PAK) 250 MG tablet Take 2 tablets (500 mg) on  Day 1,  followed by 1 tablet (250 mg) once daily on Days 2 through 5. Qty: 6 each, Refills: 0    nitroGLYCERIN (NITROSTAT) 0.4 MG SL tablet Place 0.4 mg under the tongue every 5 (five) minutes as needed for chest pain.     Spacer/Aero-Holding Chambers (AEROCHAMBER MV) inhaler Use as instructed Qty: 1 each, Refills:  0    Umeclidinium-Vilanterol (ANORO ELLIPTA) 62.5-25 MCG/INH AEPB 1 puff once daily Qty: 60 each, Refills: 3      STOP taking these medications     aspirin 81 MG tablet      indapamide (LOZOL) 2.5 MG tablet      theophylline (THEODUR) 200 MG 12 hr tablet        No Known Allergies    The results of significant diagnostics from this hospitalization (including imaging, microbiology, ancillary and laboratory) are listed below for reference.    Significant Diagnostic Studies: Dg Chest 1 View  07/15/2015   CLINICAL DATA:  Acute onset of confusion and headache. Altered mental status. Initial encounter.  EXAM: CHEST 1 VIEW  COMPARISON:  Chest radiograph from 01/03/2015  FINDINGS: The lungs are well-aerated and clear. There is no evidence of focal opacification, pleural effusion or pneumothorax.  The cardiomediastinal silhouette is within normal limits. No acute osseous abnormalities are seen.  IMPRESSION: No acute cardiopulmonary process seen.   Electronically Signed   By: Garald Balding M.D.   On: 07/15/2015 21:22   Ct Head Wo Contrast  07/15/2015   CLINICAL DATA:  Headache beginning last night. Confusion beginning today.  EXAM: CT HEAD WITHOUT CONTRAST  TECHNIQUE: Contiguous axial images were obtained from the base of the skull through the vertex without intravenous contrast.  COMPARISON:  MRI 02/22/2005  FINDINGS: The brain shows generalized atrophy with chronic appearing small vessel changes throughout the cerebral hemispheric white matter, progressive over the last decade as expected. No cortical or large vessel territory infarction. No sign of acute infarction, mass lesion, hemorrhage, hydrocephalus or extra-axial collection. No inflammatory sinus disease. There is atherosclerotic calcification of the major vessels at the base of the brain.  IMPRESSION: Atrophy and chronic small-vessel ischemic changes of the white matter, progressive over the last 10 years. No identifiable acute insult.    Electronically Signed   By: Nelson Chimes M.D.   On: 07/15/2015 20:58   Mr Brain Wo Contrast (neuro Protocol)  07/15/2015   CLINICAL DATA:  Altered mental status.  Confusion and headache.  EXAM: MRI HEAD WITHOUT CONTRAST  TECHNIQUE: Multiplanar, multiecho pulse sequences of the brain and surrounding structures were obtained without intravenous contrast.  COMPARISON:  Head CT 07/15/2015 and MRI 02/22/2005  FINDINGS: There is a 6 mm acute infarct in the region of the left external capsule. There is no evidence of intracranial hemorrhage, mass, midline shift, or extra-axial fluid collection. Mild generalized cerebral atrophy is within normal limits for age. Patchy and confluent T2 hyperintensities in the periventricular greater than subcortical cerebral white matter have greatly progressed from the prior MRI and are nonspecific but compatible with chronic small vessel ischemic disease  Prior right cataract extraction and a right scleral buckle are noted. Paranasal sinuses and mastoid air cells are clear. Major intracranial vascular flow voids are preserved.  IMPRESSION: 1. Acute lacunar infarct in the left external capsule. 2. Extensive chronic small vessel ischemic disease in the cerebral white matter.   Electronically Signed   By: Logan Bores M.D.   On: 07/15/2015 21:25   US Carotid Bilateral  07/16/2015   CLINICAL DATA:  Cerebral infarction and history of diabetes.  EXAM: BILATERAL CAROTID DUPLEX ULTRASOUND  TECHNIQUE: Pearline Cables scale imaging, color Doppler and duplex ultrasound were performed of bilateral carotid and vertebral arteries in the neck.  COMPARISON:  None.  FINDINGS: Criteria: Quantification of carotid stenosis is based on velocity parameters that correlate the residual internal carotid diameter with NASCET-based stenosis levels, using the diameter of the distal internal carotid lumen as the denominator for stenosis measurement.  The following velocity measurements were obtained:  RIGHT  ICA:  58/14  cm/sec  CCA:  154/0 cm/sec  SYSTOLIC ICA/CCA RATIO:  0.5  DIASTOLIC ICA/CCA RATIO:  1.7  ECA:  95 cm/sec  LEFT  ICA:  68/11 cm/sec  CCA:  086/76 cm/sec  SYSTOLIC ICA/CCA RATIO:  0.6  DIASTOLIC ICA/CCA RATIO:  0.7  ECA:  119 cm/sec  RIGHT CAROTID ARTERY: There is a mild amount of partially calcified plaque at the level of the common carotid artery, carotid bulb proximal internal carotid artery. Velocities and waveforms are within normal limits estimated right ICA stenosis is less than 50%.  RIGHT VERTEBRAL ARTERY: Antegrade flow with normal waveform and velocity.  LEFT CAROTID ARTERY: There is a mild amount of calcified plaque at the level of the distal carotid bulb and proximal left ICA. Velocities and waveforms  are normal and estimated left ICA stenosis is less than 50%.  LEFT VERTEBRAL ARTERY: Antegrade flow with normal waveform and velocity.  IMPRESSION: Mild amount of plaque at the level of bilateral carotid bulbs and internal carotid arteries as well as the right common carotid artery. Estimated bilateral ICA stenoses are less than 50%.   Electronically Signed   By: Aletta Edouard M.D.   On: 07/16/2015 12:42    Microbiology: No results found for this or any previous visit (from the past 240 hour(s)).   Labs: Basic Metabolic Panel:  Recent Labs Lab 07/15/15 1948 07/16/15 0021 07/16/15 0603 07/17/15 0458  NA 128*  --  127* 132*  K 3.8  --  3.0* 3.6  CL 87*  --  88* 96*  CO2 32  --  31 28  GLUCOSE 231*  --  185* 189*  BUN 17  --  13 17  CREATININE 1.04 0.96 0.91 0.81  CALCIUM 9.2  --  8.8* 9.0   Liver Function Tests:  Recent Labs Lab 07/15/15 1948  AST 24  ALT 20  ALKPHOS 67  BILITOT 0.7  PROT 7.4  ALBUMIN 4.1   CBC:  Recent Labs Lab 07/15/15 1948 07/16/15 0021 07/16/15 0603 07/17/15 0458  WBC 13.2* 12.4* 9.7 14.7*  NEUTROABS 10.8*  --   --   --   HGB 15.9 14.6 15.0 14.2  HCT 45.0 41.8 42.7 41.4  MCV 91.5 90.5 90.9 91.0  PLT 242 236 231 244   Cardiac  Enzymes:  Recent Labs Lab 07/15/15 1948  TROPONINI <0.03   BNP: BNP (last 3 results)  Recent Labs  07/15/15 1948  BNP 24.0    Signed:  Kathie Dike, MD  [facility] 07/18/2015, 2:01 PM   By signing my name below, I, Rennis Harding, attest that this documentation has been prepared under the direction and in the presence of Kathie Dike, MD. Electronically signed: Rennis Harding, Scribe. 07/18/2015 8:33 AM

## 2015-07-17 NOTE — Progress Notes (Signed)
Notified Dr. Roderic Palau of the patient probably is aspirating.  Voiced to him that the patient had an elevation of WBC.  I also voiced to him that the patient is also more so confused today versus yesterday.  The confusion began last night according to night.  We discussed a treatment plan new orders given and followed.

## 2015-07-17 NOTE — Progress Notes (Signed)
Utilization review Completed Khyli Swaim RN BSN   
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
## 2015-07-17 NOTE — Progress Notes (Signed)
[facility] PROGRESS NOTE  Luis [name]:[id number] DOB: 03/07/1934 DOA: 07/15/2015 PCP: Monico Blitz, MD  Assessment/Plan: 1. Acute encephalopathy, etiology unclear. CXR unremarkable. WBC has trended up to 14.7, likely due to steroids. UA did not show any signs of infection and Urine tox screen was unremarkable. ABG revealed normal PCO2. Encephalopathy is likely multifactorial in the setting of CVA possible mild hypoxia with COPD. Family reports episode of confusion overnight and this morning. Wife also reports that he has shown mild confusion at home within the last few months that worsens at night. She has scheduled an appointment with Dr. Delice Lesch, neurology for 11/10. May be in early stages of dementia, appears to be alert and oriented at this time.  2. Acute CVA. MRI of the brain revealed acute lacunar infarct in the left external capsule. Carotid Dopplers unremarkable. ECHO results as below. Will continue ASA and Simvastatin daily. HgbA1C in process. ST/PT has been ordered. Patient likely has an element of chronic dysphagia.Family reports he has been eating soft foods at home. I am considering he may be aspirating with a dysphagia III diet. Will keep NPO until seen by speech therapy. 3. COPD exacerbation. Breathing comfortably on room air. CXR unremarkable. Will wean steroids and continue abx, and bronchodilators. Advair and Theophylline have been resumed.  4. Hyponatremia, improving.  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 Will continue to replete with IVF and free water restriction.  5. Hypokalemia, resolved.  6. Essential hypertension, improved. Will continue to monitor.  7. DM type 2, stable. Will continue SSI.   Code Status: Full DVT Prophylaxis: Lovenox Family Communication: Family at bedside. Discussed with patient who understands and has no concerns at this time. Disposition Plan: Anticipate discharge within 1-2 days.   Consultants:    Procedures:  ECHO Study  Conclusions - Left ventricle: The cavity size was normal. Systolic function was vigorous. The estimated ejection fraction was in the range of 65% to 70%. Wall motion was normal; there were no regional wall motion abnormalities. Doppler parameters are consistent with abnormal left ventricular relaxation (grade 1 diastolic dysfunction). Doppler parameters are consistent with elevated ventricular end-diastolic filling pressure. - Aortic valve: There was no regurgitation. - Aortic root: The aortic root was normal in size. - Mitral valve: There was no regurgitation. - Left atrium: The atrium was normal in size. - Right ventricle: Systolic function was normal. - Right atrium: The atrium was normal in size. - Tricuspid valve: There was trivial regurgitation. - Pulmonic valve: There was no regurgitation. - Pulmonary arteries: Systolic pressure was within the normal range. - Inferior vena cava: The vessel was normal in size. - Pericardium, extracardiac: There was no pericardial effusion.  Antibiotics:  Azithromycin 10/8>>  Rocephin 10/8>>  HPI/Subjective: Feeling better today. Overnight and this morning he was mildly confused. Breathing is improvement and coughing is stable when sitting up.   Wife reports that he has had a few confusion spells over there the last few months. This is why she made the initial neurology appointment. The confusion occurs more so during the day. He still drives with his wife as passenger. Mild confusion of purpose while driving but denies getting lost or confusion of location. Is still able to handle finances without difficulty.    Objective: Filed Vitals:   07/17/15 0400  BP: 150/75  Pulse: 76  Temp: 98.3 F (36.8 C)  Resp: 20    Intake/Output Summary (Last 24 hours) at 07/17/15 4401 Last data filed at 07/17/15 0272  Gross per 24 hour  Intake 3953.33 ml  Output   3075 ml  Net 878.33 ml   Filed Weights   07/15/15 1926 07/15/15 2352   Weight: 82.555 kg (182 lb) 82.555 kg (182 lb)    Exam:  General: NAD. Sitting in chair and looks comfortable.  Cardiovascular: RRR, S1, S2   Respiratory: Mild expiratory wheezing bilaterally.   Abdomen: soft, non tender, no distention , bowel sounds normal  Musculoskeletal: No edema b/l  Psychiatric: Oriented to place and day. Speech is appropriate.   Data Reviewed: Basic Metabolic Panel:  Recent Labs Lab 07/15/15 1948 07/16/15 0021 07/16/15 0603 07/17/15 0458  NA 128*  --  127* 132*  K 3.8  --  3.0* 3.6  CL 87*  --  88* 96*  CO2 32  --  31 28  GLUCOSE 231*  --  185* 189*  BUN 17  --  13 17  CREATININE 1.04 0.96 0.91 0.81  CALCIUM 9.2  --  8.8* 9.0   Liver Function Tests:  Recent Labs Lab 07/15/15 1948  AST 24  ALT 20  ALKPHOS 67  BILITOT 0.7  PROT 7.4  ALBUMIN 4.1    CBC:  Recent Labs Lab 07/15/15 1948 07/16/15 0021 07/16/15 0603 07/17/15 0458  WBC 13.2* 12.4* 9.7 14.7*  NEUTROABS 10.8*  --   --   --   HGB 15.9 14.6 15.0 14.2  HCT 45.0 41.8 42.7 41.4  MCV 91.5 90.5 90.9 91.0  PLT 242 236 231 244   Cardiac Enzymes:  Recent Labs Lab 07/15/15 1948  TROPONINI <0.03   BNP (last 3 results)  Recent Labs  07/15/15 1948  BNP 24.0      Studies: Dg Chest 1 View  07/15/2015   CLINICAL DATA:  Acute onset of confusion and headache. Altered mental status. Initial encounter.  EXAM: CHEST 1 VIEW  COMPARISON:  Chest radiograph from 01/03/2015  FINDINGS: The lungs are well-aerated and clear. There is no evidence of focal opacification, pleural effusion or pneumothorax.  The cardiomediastinal silhouette is within normal limits. No acute osseous abnormalities are seen.  IMPRESSION: No acute cardiopulmonary process seen.   Electronically Signed   By: Garald Balding M.D.   On: 07/15/2015 21:22   Ct Head Wo Contrast  07/15/2015   CLINICAL DATA:  Headache beginning last night. Confusion beginning today.  EXAM: CT HEAD WITHOUT CONTRAST  TECHNIQUE:  Contiguous axial images were obtained from the base of the skull through the vertex without intravenous contrast.  COMPARISON:  MRI 02/22/2005  FINDINGS: The brain shows generalized atrophy with chronic appearing small vessel changes throughout the cerebral hemispheric white matter, progressive over the last decade as expected. No cortical or large vessel territory infarction. No sign of acute infarction, mass lesion, hemorrhage, hydrocephalus or extra-axial collection. No inflammatory sinus disease. There is atherosclerotic calcification of the major vessels at the base of the brain.  IMPRESSION: Atrophy and chronic small-vessel ischemic changes of the white matter, progressive over the last 10 years. No identifiable acute insult.   Electronically Signed   By: Nelson Chimes M.D.   On: 07/15/2015 20:58   Mr Brain Wo Contrast (neuro Protocol)  07/15/2015   CLINICAL DATA:  Altered mental status.  Confusion and headache.  EXAM: MRI HEAD WITHOUT CONTRAST  TECHNIQUE: Multiplanar, multiecho pulse sequences of the brain and surrounding structures were obtained without intravenous contrast.  COMPARISON:  Head CT 07/15/2015 and MRI 02/22/2005  FINDINGS: There is a 6 mm acute infarct in the region of the left external  capsule. There is no evidence of intracranial hemorrhage, mass, midline shift, or extra-axial fluid collection. Mild generalized cerebral atrophy is within normal limits for age. Patchy and confluent T2 hyperintensities in the periventricular greater than subcortical cerebral white matter have greatly progressed from the prior MRI and are nonspecific but compatible with chronic small vessel ischemic disease  Prior right cataract extraction and a right scleral buckle are noted. Paranasal sinuses and mastoid air cells are clear. Major intracranial vascular flow voids are preserved.  IMPRESSION: 1. Acute lacunar infarct in the left external capsule. 2. Extensive chronic small vessel ischemic disease in the  cerebral white matter.   Electronically Signed   By: Logan Bores M.D.   On: 07/15/2015 21:25   US Carotid Bilateral  07/16/2015   CLINICAL DATA:  Cerebral infarction and history of diabetes.  EXAM: BILATERAL CAROTID DUPLEX ULTRASOUND  TECHNIQUE: Pearline Cables scale imaging, color Doppler and duplex ultrasound were performed of bilateral carotid and vertebral arteries in the neck.  COMPARISON:  None.  FINDINGS: Criteria: Quantification of carotid stenosis is based on velocity parameters that correlate the residual internal carotid diameter with NASCET-based stenosis levels, using the diameter of the distal internal carotid lumen as the denominator for stenosis measurement.  The following velocity measurements were obtained:  RIGHT  ICA:  58/14 cm/sec  CCA:  161/0 cm/sec  SYSTOLIC ICA/CCA RATIO:  0.5  DIASTOLIC ICA/CCA RATIO:  1.7  ECA:  95 cm/sec  LEFT  ICA:  68/11 cm/sec  CCA:  960/45 cm/sec  SYSTOLIC ICA/CCA RATIO:  0.6  DIASTOLIC ICA/CCA RATIO:  0.7  ECA:  119 cm/sec  RIGHT CAROTID ARTERY: There is a mild amount of partially calcified plaque at the level of the common carotid artery, carotid bulb proximal internal carotid artery. Velocities and waveforms are within normal limits estimated right ICA stenosis is less than 50%.  RIGHT VERTEBRAL ARTERY: Antegrade flow with normal waveform and velocity.  LEFT CAROTID ARTERY: There is a mild amount of calcified plaque at the level of the distal carotid bulb and proximal left ICA. Velocities and waveforms are normal and estimated left ICA stenosis is less than 50%.  LEFT VERTEBRAL ARTERY: Antegrade flow with normal waveform and velocity.  IMPRESSION: Mild amount of plaque at the level of bilateral carotid bulbs and internal carotid arteries as well as the right common carotid artery. Estimated bilateral ICA stenoses are less than 50%.   Electronically Signed   By: Aletta Edouard M.D.   On: 07/16/2015 12:42    Scheduled Meds: . aspirin  81 mg Oral Daily  . azithromycin   500 mg Intravenous Q24H  . cefTRIAXone (ROCEPHIN)  IV  1 g Intravenous Q24H  . enoxaparin (LOVENOX) injection  40 mg Subcutaneous Q24H  . ipratropium-albuterol  3 mL Nebulization Q6H WA  . methylPREDNISolone (SOLU-MEDROL) injection  60 mg Intravenous Q6H  . mometasone-formoterol  2 puff Inhalation BID  . pantoprazole  40 mg Oral Daily  . polyethylene glycol  17 g Oral Daily  . simvastatin  20 mg Oral q1800  . theophylline  200 mg Oral TID   Continuous Infusions: . sodium chloride 100 mL/hr at 07/16/15 2213    Principal Problem:   Altered mental status Active Problems:   COPD exacerbation (HCC)   Hyponatremia   Essential hypertension   CVA (cerebral vascular accident) ([place])   Hypoxia   Diabetes mellitus ([place])   Time spent: 35 minutes   Jehanzeb Memon. MD  [facility] Pager [phone] If 7PM-7AM, please contact  night-coverage at [email or web address], password [facility] 07/17/2015, 6:38 AM  LOS: 2 days    By signing my name below, I, Rennis Harding, attest that this documentation has been prepared under the direction and in the presence of Kathie Dike, MD. Electronically signed: Rennis Harding, Scribe. 07/17/2015 11:26 AM     I, Dr. Kathie Dike, personally performed the services described in this documentaiton. All medical record entries made by the scribe were at my direction and in my presence. I have reviewed the chart and agree that the record reflects my personal performance and is accurate and complete  Kathie Dike, MD, 07/17/2015 11:52 AM

## 2015-07-18 DIAGNOSIS — G9340 Encephalopathy, unspecified: Secondary | ICD-10-CM | POA: Insufficient documentation

## 2015-07-18 DIAGNOSIS — J9601 Acute respiratory failure with hypoxia: Secondary | ICD-10-CM

## 2015-07-18 DIAGNOSIS — I639 Cerebral infarction, unspecified: Secondary | ICD-10-CM | POA: Insufficient documentation

## 2015-07-18 LAB — GLUCOSE, CAPILLARY
GLUCOSE-CAPILLARY: 109 mg/dL — AB (ref 65–99)
GLUCOSE-CAPILLARY: 130 mg/dL — AB (ref 65–99)
GLUCOSE-CAPILLARY: 203 mg/dL — AB (ref 65–99)
Glucose-Capillary: 128 mg/dL — ABNORMAL HIGH (ref 65–99)

## 2015-07-18 LAB — HEMOGLOBIN A1C
HEMOGLOBIN A1C: 6.8 % — AB (ref 4.8–5.6)
Mean Plasma Glucose: 148 mg/dL

## 2015-07-18 MED ORDER — ASPIRIN EC 325 MG PO TBEC: 325.0000 mg | DELAYED_RELEASE_TABLET | Freq: Every day | ORAL | Status: DC

## 2015-07-18 MED ORDER — SIMVASTATIN 20 MG PO TABS: 20.0000 mg | ORAL_TABLET | Freq: Every day | ORAL | Status: DC

## 2015-07-18 MED ORDER — PREDNISONE 10 MG PO TABS: ORAL_TABLET | ORAL | Status: DC

## 2015-07-18 MED ORDER — STROKE: EARLY STAGES OF RECOVERY BOOK
Freq: Once | Status: AC
  Administered 2015-07-18: 11:00:00
  Filled 2015-07-18: qty 1

## 2015-07-18 NOTE — Progress Notes (Deleted)
[facility] PROGRESS NOTE  Luis [name] QIH:[id number] DOB: 12/21/1933 DOA: 07/15/2015 PCP: Monico Blitz, MD  Assessment/Plan: 1. Acute encephalopathy, etiology unclear. CXR unremarkable. UA did not show any signs of infection and Urine tox screen was unremarkable. Encephalopathy is likely multifactorial in the setting of CVA or possible mild hypoxia with COPD. Wife also reported that he has shown mild confusion at home within the last few months that worsens at night. She has scheduled an appointment with Dr. Delice Lesch, neurology for 11/10. May be in early stages of dementia. Mental status is back to baseline.  2. Acute CVA. MRI of the brain revealed acute lacunar infarct in the left external capsule. Carotid Dopplers unremarkable. ECHO results as below. Will continue ASA and Simvastatin daily. HgbA1C still in process. ST/PT has been ordered. Patient likely has an element of chronic dysphagia. Family reports he has been eating soft foods at home. I am concerned he may be aspirating with a dysphagia III diet. Will keep NPO until seen by speech therapy. 3. COPD exacerbation. Breathing comfortably on room air. CXR unremarkable. Will continue wean steroids and continue abx, and bronchodilators.  4. Hyponatremia, improving.  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 Will continue to replete with IVF and free water restriction.  5. Hypokalemia, resolved.  6. Essential hypertension, improved. Will continue to monitor.  7. DM type 2, stable. Will continue SSI.   Code Status: Full DVT Prophylaxis: Lovenox Family Communication: Family at bedside. Discussed with patient who understands and has no concerns at this time. Disposition Plan: Discharge home after swallow evaluation.   Consultants:    Procedures:  ECHO Study Conclusions - Left ventricle: The cavity size was normal. Systolic function was vigorous. The estimated ejection fraction was in the range of 65% to 70%. Wall motion was normal;  there were no regional wall motion abnormalities. Doppler parameters are consistent with abnormal left ventricular relaxation (grade 1 diastolic dysfunction). Doppler parameters are consistent with elevated ventricular end-diastolic filling pressure. - Aortic valve: There was no regurgitation. - Aortic root: The aortic root was normal in size. - Mitral valve: There was no regurgitation. - Left atrium: The atrium was normal in size. - Right ventricle: Systolic function was normal. - Right atrium: The atrium was normal in size. - Tricuspid valve: There was trivial regurgitation. - Pulmonic valve: There was no regurgitation. - Pulmonary arteries: Systolic pressure was within the normal range. - Inferior vena cava: The vessel was normal in size. - Pericardium, extracardiac: There was no pericardial effusion.  Antibiotics:  Azithromycin 10/8>>  Rocephin 10/8>>  HPI/Subjective: Feeling well. Mild cough but breathing is improving.   Objective: Filed Vitals:   07/18/15 0524  BP: 162/85  Pulse: 65  Temp: 98.2 F (36.8 C)  Resp: 20    Intake/Output Summary (Last 24 hours) at 07/18/15 0807 Last data filed at 07/18/15 0643  Gross per 24 hour  Intake 3251.67 ml  Output   1250 ml  Net 2001.67 ml   Filed Weights   07/15/15 1926 07/15/15 2352  Weight: 82.555 kg (182 lb) 82.555 kg (182 lb)    Exam: General: NAD, looks comfortable Cardiovascular: RRR, S1, S2  Respiratory: Mild wheeze bilaterally Abdomen: soft, non tender, no distention , bowel sounds normal Musculoskeletal: No edema b/l  Data Reviewed: Basic Metabolic Panel:  Recent Labs Lab 07/15/15 1948 07/16/15 0021 07/16/15 0603 07/17/15 0458  NA 128*  --  127* 132*  K 3.8  --  3.0* 3.6  CL 87*  --  88* 96*  CO2 32  --  31 28  GLUCOSE 231*  --  185* 189*  BUN 17  --  13 17  CREATININE 1.04 0.96 0.91 0.81  CALCIUM 9.2  --  8.8* 9.0   Liver Function Tests:  Recent Labs Lab 07/15/15 1948  AST  24  ALT 20  ALKPHOS 67  BILITOT 0.7  PROT 7.4  ALBUMIN 4.1    CBC:  Recent Labs Lab 07/15/15 1948 07/16/15 0021 07/16/15 0603 07/17/15 0458  WBC 13.2* 12.4* 9.7 14.7*  NEUTROABS 10.8*  --   --   --   HGB 15.9 14.6 15.0 14.2  HCT 45.0 41.8 42.7 41.4  MCV 91.5 90.5 90.9 91.0  PLT 242 236 231 244   Cardiac Enzymes:  Recent Labs Lab 07/15/15 1948  TROPONINI <0.03   BNP (last 3 results)  Recent Labs  07/15/15 1948  BNP 24.0      Studies: US Carotid Bilateral  07/16/2015   CLINICAL DATA:  Cerebral infarction and history of diabetes.  EXAM: BILATERAL CAROTID DUPLEX ULTRASOUND  TECHNIQUE: Pearline Cables scale imaging, color Doppler and duplex ultrasound were performed of bilateral carotid and vertebral arteries in the neck.  COMPARISON:  None.  FINDINGS: Criteria: Quantification of carotid stenosis is based on velocity parameters that correlate the residual internal carotid diameter with NASCET-based stenosis levels, using the diameter of the distal internal carotid lumen as the denominator for stenosis measurement.  The following velocity measurements were obtained:  RIGHT  ICA:  58/14 cm/sec  CCA:  213/0 cm/sec  SYSTOLIC ICA/CCA RATIO:  0.5  DIASTOLIC ICA/CCA RATIO:  1.7  ECA:  95 cm/sec  LEFT  ICA:  68/11 cm/sec  CCA:  865/78 cm/sec  SYSTOLIC ICA/CCA RATIO:  0.6  DIASTOLIC ICA/CCA RATIO:  0.7  ECA:  119 cm/sec  RIGHT CAROTID ARTERY: There is a mild amount of partially calcified plaque at the level of the common carotid artery, carotid bulb proximal internal carotid artery. Velocities and waveforms are within normal limits estimated right ICA stenosis is less than 50%.  RIGHT VERTEBRAL ARTERY: Antegrade flow with normal waveform and velocity.  LEFT CAROTID ARTERY: There is a mild amount of calcified plaque at the level of the distal carotid bulb and proximal left ICA. Velocities and waveforms are normal and estimated left ICA stenosis is less than 50%.  LEFT VERTEBRAL ARTERY: Antegrade flow  with normal waveform and velocity.  IMPRESSION: Mild amount of plaque at the level of bilateral carotid bulbs and internal carotid arteries as well as the right common carotid artery. Estimated bilateral ICA stenoses are less than 50%.   Electronically Signed   By: Aletta Edouard M.D.   On: 07/16/2015 12:42    Scheduled Meds: . aspirin  81 mg Oral Daily  . azithromycin  500 mg Intravenous Q24H  . cefTRIAXone (ROCEPHIN)  IV  1 g Intravenous Q24H  . enoxaparin (LOVENOX) injection  40 mg Subcutaneous Q24H  . ipratropium-albuterol  3 mL Nebulization Q6H WA  . methylPREDNISolone (SOLU-MEDROL) injection  60 mg Intravenous Q12H  . mometasone-formoterol  2 puff Inhalation BID  . pantoprazole  40 mg Oral Daily  . polyethylene glycol  17 g Oral Daily  . simvastatin  20 mg Oral q1800  . theophylline  200 mg Oral TID   Continuous Infusions: . sodium chloride 100 mL/hr at 07/17/15 2241    Principal Problem:   Altered mental status Active Problems:   COPD exacerbation (HCC)   Hyponatremia   Essential hypertension  CVA (cerebral vascular accident) ([place])   Hypoxia   Diabetes mellitus ([place])   Time spent: 25 minutes   Jehanzeb Memon. MD  [facility] Pager [phone] If 7PM-7AM, please contact night-coverage at [email or web address], password [facility] 07/18/2015, 8:07 AM  LOS: 3 days    By signing my name below, I, Rennis Harding, attest that this documentation has been prepared under the direction and in the presence of Kathie Dike, MD. Electronically signed: Rennis Harding, Scribe. 07/18/2015 8:40 AM     I, Dr. Kathie Dike, personally performed the services described in this documentaiton. All medical record entries made by the scribe were at my direction and in my presence. I have reviewed the chart and agree that the record reflects my personal performance and is accurate and complete  Kathie Dike, MD, 07/18/2015 8:07 AM  I, Dr. Kathie Dike, personally performed the  services described in this documentaiton. All medical record entries made by the scribe were at my direction and in my presence. I have reviewed the chart and agree that the record reflects my personal performance and is accurate and complete  Kathie Dike, MD, 07/18/2015 8:49 AM

## 2015-07-18 NOTE — Care Management Important Message (Signed)
Important Message  Patient Details  Name: Luis [name] MRN: [id number] Date of Birth: July 02, 1934   Medicare Important Message Given:  Yes-second notification given    Joylene Draft, RN 07/18/2015, 2:06 PM

## 2015-07-18 NOTE — Care Management Note (Signed)
Case Management Note  Patient Details  Name: Luis [name] MRN: [id number] Date of Birth: 04-29-1934  Subjective/Objective:                  Pt admitted from home with CVA. Pt lives with his wife and will return home at discharge. Pt is independent with ADl's. Pt has a neb machine for home use.  Action/Plan: Pt discharged home today. No CM needs noted.  Expected Discharge Date:                  Expected Discharge Plan:  Home/Self Care  In-House Referral:  NA  Discharge planning Services  CM Consult  Post Acute Care Choice:  NA Choice offered to:  NA  DME Arranged:    DME Agency:     HH Arranged:    HH Agency:     Status of Service:  Completed, signed off  Medicare Important Message Given:  Yes-second notification given Date Medicare IM Given:    Medicare IM give by:    Date Additional Medicare IM Given:    Additional Medicare Important Message give by:     If discussed at [place] of Stay Meetings, dates discussed:    Additional Comments:  Joylene Draft, RN 07/18/2015, 2:06 PM

## 2015-07-18 NOTE — Final Progress Note (Signed)
Patient discharged with instructions, prescription, and care notes.  Verbalized understanding via teach back.  IV was removed and the site was WNL. Patient voiced no further complaints or concerns at the time of discharge.  Appointments scheduled per instructions.  Patient left the floor via w/c with staff and family in stable condition. 

## 2015-07-18 NOTE — Evaluation (Signed)
Clinical/Bedside Swallow Evaluation Patient Details  Name: Luis [name] MRN: [id number] Date of Birth: 1934/04/12  Today's Date: 07/18/2015 Time: SLP Start Time (ACUTE ONLY): 1000 SLP Stop Time (ACUTE ONLY): 1042 SLP Time Calculation (min) (ACUTE ONLY): 42 min  Past Medical History:  Past Medical History  Diagnosis Date  . COPD (chronic obstructive pulmonary disease) ([place])   . Hypertension   . Dysphagia   . High cholesterol   . Emphysema   . Emphysema of lung ([place])   . GERD (gastroesophageal reflux disease)   . Heart murmur     as a child  . Exertional shortness of breath   . Diabetes mellitus without complication [facility])    Past Surgical History:  Past Surgical History  Procedure Laterality Date  . Retinal detachment surgery Right 08/29/2010  . Esophageal dilation  2000's    x2  . Eye surgery    . Inguinal hernia repair Right 1970's  . Inguinal hernia repair Bilateral 03/12/2013    w/mesh bilaterally/notes 03/12/2013  . Tonsillectomy  1940's  . Transurethral resection of prostate  2002; 2013  . Cataract extraction w/ intraocular lens implant Left 2011  . Inguinal hernia repair Bilateral 03/12/2013    Procedure: HERNIA REPAIR INGUINAL ADULT BILATERAL;  Surgeon: Merrie Roof, MD;  Location: [place];  Service: General;  Laterality: Bilateral;  . Insertion of mesh Bilateral 03/12/2013    Procedure: INSERTION OF MESH;  Surgeon: Merrie Roof, MD;  Location: [place];  Service: General;  Laterality: Bilateral;   HPI:  7 yom with history of COPD, hypertension and DM presented with complaints of headache and acute encephalopathy reported by family. Initial workup in the ED revealed hyponatremia, sodium 128 and MRI brain revealed lacunar infarct in the left external capsule. Patient was also found to be hypoxic with oxygen saturations in the 80's and started on 2L [place]. Admitted for further evaluation and monitoring. Etiology of acute encephalopathy remains unclear upon discharge, though  there is a possibility of early onset dementia. UA and urine tox screen were both unremarkable. There was also a possiblity of acute encephalopathy being multifactorial in the setting of CVA or mild hypoxia with COPD. He had mild confusion that increased at night but overalll remained alert and oriented during hospitalization. Wife scheduled an appointment prior to hospitalization with Dr. Delice Lesch, neurology, on 11/10. Acute CVA was revealed on the MRI of the brain. MRI of the brain revealed acute lacunar infarct in the left external capsule. Carotid Dopplers unremarkable. ECHO results as below. Simvastatin was increased to 20mg  daily, due to LDL of 88. Dg Chest 1 View 07/15/2015 FINDINGS: The lungs are well-aerated and clear. There is no evidence of focal opacification, pleural effusion or pneumothorax. The cardiomediastinal silhouette is within normal limits. No acute osseous abnormalities are seen. IMPRESSION: No acute cardiopulmonary process seen. SLP asked to evaluate swallow due to possible increased congestion after meal yesterday.   Assessment / Plan / Recommendation Clinical Impression  Mr. Luis [name] was seen at bedside with family present. Pt reports history of esophageal dysphagia in the past necessitating dilation with a physician in [place] years ago. For the past several years he has compensating for difficulties swallowing by avoiding steak, chewing thoroughly, and eating slowly. He takes a PPI before bed. He sees Dr. Annamaria Boots (Pulmonology) for SOB which is likely due to COPD per pt/wife. Oral motor examination is unremarkable. Pt with mild wet vocal quality prior to po administration and remained throughout my visit. Pt shows  no overt signs or symptoms of aspiration over course of trials. Discussed findings and recommendations with pt, wife, and granddaughter Investment banker, corporate): Chest x-ray clear, pt afebrile, episode of elevate white blood cell count, history of esophageal dysphagia with improvement after dilation  years ago; Recommend self regulated regular textures at home (D3/mech soft in the hospital setting) with avoidance of steak, thin liquids, masticate foods thoroughly, eat/drink slowly, alternate solids/liquids, implement effortful swallow, sit upright for all eating/drinking and remain upright for 30-60 minutes after to allow for benefit of gravity on esophageal clearance. Recommendations were provided in written form and questions answered. Family is hoping to move neurology appointment to closer date (now scheduled for Nov 10) and this was relayed to RN. Can complete MBSS as an outpatient should symptoms worsen, however pt/family in agreement to try above strategies for now. No further SLP services indicated at this time.     Aspiration Risk  Mild    Diet Recommendation Dysphagia 3 (Mech soft);Thin (OK at home for self regulated regular textures)   Medication Administration: Whole meds with liquid Compensations: Slow rate;Multiple dry swallows after each bite/sip;Effortful swallow (masticate solids thoroughly)    Other  Recommendations Recommended Consults: Consider esophageal assessment (possibly as an out patient if pt decides) Oral Care Recommendations: Oral care BID Other Recommendations: Clarify dietary restrictions   Follow Up Recommendations       Frequency and Duration  N/A     Pertinent Vitals/Pain VSS      Swallow Study Prior Functional Status   Lives at home with wife    General Date of Onset: 07/15/15 Other Pertinent Information: 64 yom with history of COPD, hypertension and DM presented with complaints of headache and acute encephalopathy reported by family. Initial workup in the ED revealed hyponatremia, sodium 128 and MRI brain revealed lacunar infarct in the left external capsule. Patient was also found to be hypoxic with oxygen saturations in the 80's and started on 2L [place]. Admitted for further evaluation and monitoring. Etiology of acute encephalopathy remains unclear  upon discharge, though there is a possibility of early onset dementia. UA and urine tox screen were both unremarkable. There was also a possiblity of acute encephalopathy being multifactorial in the setting of CVA or mild hypoxia with COPD. He had mild confusion that increased at night but overalll remained alert and oriented during hospitalization. Wife scheduled an appointment prior to hospitalization with Dr. Delice Lesch, neurology, on 11/10. Acute CVA was revealed on the MRI of the brain. MRI of the brain revealed acute lacunar infarct in the left external capsule. Carotid Dopplers unremarkable. ECHO results as below. Simvastatin was increased to 20mg  daily, due to LDL of 88. Dg Chest 1 View 07/15/2015 FINDINGS: The lungs are well-aerated and clear. There is no evidence of focal opacification, pleural effusion or pneumothorax. The cardiomediastinal silhouette is within normal limits. No acute osseous abnormalities are seen. IMPRESSION: No acute cardiopulmonary process seen. SLP asked to evaluate swallow due to possible increased congestion after meal yesterday. Type of Study: Bedside swallow evaluation Previous Swallow Assessment: None on record Diet Prior to this Study: NPO Temperature Spikes Noted: No Respiratory Status: Room air History of Recent Intubation: No Behavior/Cognition: Alert;Cooperative;Pleasant mood Oral Cavity - Dentition: Adequate natural dentition/normal for age Self-Feeding Abilities: Able to feed self Patient Positioning: Upright in bed Baseline Vocal Quality: Normal Volitional Cough: Strong;Congested Volitional Swallow: Able to elicit    Oral/Motor/Sensory Function Overall Oral Motor/Sensory Function: Appears within functional limits for tasks assessed Labial ROM: Within Functional Limits Labial  Symmetry: Within Functional Limits Labial Strength: Within Functional Limits Labial Sensation: Within Functional Limits Lingual ROM: Within Functional Limits Lingual Symmetry:  Within Functional Limits Lingual Strength: Within Functional Limits Lingual Sensation: Within Functional Limits Facial ROM: Within Functional Limits Facial Symmetry: Within Functional Limits Facial Strength: Within Functional Limits Facial Sensation: Within Functional Limits Velum: Within Functional Limits Mandible: Within Functional Limits   Ice Chips Ice chips: Within functional limits Presentation: Spoon   Thin Liquid Thin Liquid: Within functional limits Presentation: Cup;Self Fed;Straw    Nectar Thick Nectar Thick Liquid: Not tested   Honey Thick Honey Thick Liquid: Not tested   Puree Puree: Within functional limits Presentation: Spoon;Self Fed Other Comments: Pt consumed 4 ounces   Solid   Thank you,  Genene Churn, CCC-SLP [phone]     Solid: Within functional limits Presentation: Self Fed       PORTER,DABNEY 07/18/2015,11:46 AM

## 2015-07-23 NOTE — Assessment & Plan Note (Signed)
We need to clarify oxygen status Plan-schedule 6 minute walk test and overnight oximetry, flu vaccine

## 2015-07-23 NOTE — Assessment & Plan Note (Signed)
Exam is not really different but he describes increased bronchitis symptoms. We discussed options. Plan-try changing current maintenance inhalers to sample Anoro, wait on antibiotic or steroid burst

## 2015-07-26 ENCOUNTER — Encounter: Payer: Self-pay | Admitting: Neurology

## 2015-07-26 ENCOUNTER — Ambulatory Visit (INDEPENDENT_AMBULATORY_CARE_PROVIDER_SITE_OTHER): Payer: Medicare Other | Admitting: Neurology

## 2015-07-26 VITALS — BP 98/66 | HR 56 | Wt 177.0 lb

## 2015-07-26 DIAGNOSIS — G3184 Mild cognitive impairment, so stated: Secondary | ICD-10-CM | POA: Insufficient documentation

## 2015-07-26 DIAGNOSIS — E119 Type 2 diabetes mellitus without complications: Secondary | ICD-10-CM

## 2015-07-26 DIAGNOSIS — I10 Essential (primary) hypertension: Secondary | ICD-10-CM | POA: Diagnosis not present

## 2015-07-26 DIAGNOSIS — E785 Hyperlipidemia, unspecified: Secondary | ICD-10-CM | POA: Diagnosis not present

## 2015-07-26 DIAGNOSIS — I63032 Cerebral infarction due to thrombosis of left carotid artery: Secondary | ICD-10-CM | POA: Diagnosis not present

## 2015-07-26 MED ORDER — DONEPEZIL HCL 5 MG PO TABS: ORAL_TABLET | ORAL | Status: DC

## 2015-07-26 NOTE — Progress Notes (Signed)
NEUROLOGY CONSULTATION NOTE  Luis [name] MRN: [id number] DOB: Feb 13, 1934  Referring provider: Dr. Monico Blitz Primary care provider: Dr. Monico Blitz  Reason for consult:  Memory loss, recent stroke  Dear Dr Luis [name]:  Thank you for your kind referral of Luis [name] for consultation of the above symptoms. Although his history is well known to you, please allow me to reiterate it for the purpose of our medical record. The patient was accompanied to the clinic by his wife and son who also provide collateral information. Records and images were personally reviewed where available.  HISTORY OF PRESENT ILLNESS: This is a very pleasant 81 year old right-handed man with a history of hypertension, hyperlipidemia, diabetes, COPD, presenting for evaluation of worsening memory and hospital follow-up after recent stroke. He feels his memory "could be better," he sometimes has to think when asked a question. His family reports memory changes started over the past year, he would forget names, conversations, ask the same questions. He would misplace things. His wife has recently started to pay attention to make sure he takes his medications, but he denies missing any doses. He continues to balance their checkbook and denies any missed bill payments. Prior to the stroke, he was driving and denied getting lost, but family has noticed his wife was doing more of the driving. He has occasional word-finding difficulties. He denies any difficulties with ADLs. On 07/15/15, they were at a picnic when family noticed that something was "not right," he had rambling speech and was not making sense, starting off a sentence then drifting off. There was no slurred speech or focal weakness noted. He was brought to [facility] where MRI brain showed a small acute infarct in the left external capsule. I personally reviewed MRI brain, in addition there was moderate chronic microvascular disease. Carotid dopplers showed less than  50% stenosis bilaterally, mild plaque at the level of the carotid bulbs and ICA as well as the right common carotid artery. Echocardiogram showed EF 65-70%, normal left atrium. He had been taking low dose aspirin, and was switched to full dose 325mg  aspirin. Lipid panel showed total cholesterol 164, LDL 88. Simvastatin dose increased to 20mg  daily. HbA1c 6.8.  He was also diagnosed with hyponatremia with sodium of 128 and hypoxia from COPD exacerbation. On hospital discharge, sodium level was 132. He was back to cognitive baseline.   He denies any headaches, dizziness, diplopia, dysarthria, dysphagia, neck/back pain, focal numbness/tingling/weakness, bowel/bladder dysfunction. No tremors, no falls. He denies any significant head injuries. No family history of dementia. He has always had a poor sense of smell. His wife has noticed he is a little more irritable. No paranoia.   PAST MEDICAL HISTORY: Past Medical History  Diagnosis Date  . COPD (chronic obstructive pulmonary disease) ([place])   . Hypertension   . Dysphagia   . High cholesterol   . Emphysema   . Emphysema of lung ([place])   . GERD (gastroesophageal reflux disease)   . Heart murmur     as a child  . Exertional shortness of breath   . Diabetes mellitus without complication ([place])     PAST SURGICAL HISTORY: Past Surgical History  Procedure Laterality Date  . Retinal detachment surgery Right 08/29/2010  . Esophageal dilation  2000's    x2  . Eye surgery    . Inguinal hernia repair Right 1970's  . Inguinal hernia repair Bilateral 03/12/2013    w/mesh bilaterally/notes 03/12/2013  . Tonsillectomy  1940's  .  Transurethral resection of prostate  2002; 2013  . Cataract extraction w/ intraocular lens implant Left 2011  . Inguinal hernia repair Bilateral 03/12/2013    Procedure: HERNIA REPAIR INGUINAL ADULT BILATERAL;  Surgeon: Merrie Roof, MD;  Location: [place];  Service: General;  Laterality: Bilateral;  . Insertion of mesh Bilateral  03/12/2013    Procedure: INSERTION OF MESH;  Surgeon: Merrie Roof, MD;  Location: [place];  Service: General;  Laterality: Bilateral;    MEDICATIONS: Current Outpatient Prescriptions on File Prior to Visit  Medication Sig Dispense Refill  . aspirin EC 325 MG tablet Take 1 tablet (325 mg total) by mouth daily. 30 tablet 0  . Cholecalciferol (D3 MAXIMUM STRENGTH) 5000 UNITS capsule Take 5,000 Units by mouth daily.    . metFORMIN (GLUCOPHAGE) 500 MG tablet Take 500 mg by mouth daily.  2  . omeprazole (PRILOSEC) 20 MG capsule Take 20 mg by mouth daily.    . polyethylene glycol (MIRALAX / GLYCOLAX) packet Take 17 g by mouth daily.    . predniSONE (DELTASONE) 10 MG tablet Take 40mg  po daily for 2 days then 30mg  daily for 2 days then 20mg  daily for 2 days then 10mg  daily for 2 days then stop 20 tablet 0  . PREVIDENT 5000 BOOSTER PLUS 1.1 % PSTE 3 (three) times daily. as directed  99  . simvastatin (ZOCOR) 20 MG tablet Take 1 tablet (20 mg total) by mouth daily at 6 PM. 1/2 tablet daily. 30 tablet 1  . theophylline (UNIPHYL) 400 MG 24 hr tablet Take 200 mg by mouth 3 (three) times daily.  11  . albuterol (PROVENTIL HFA;VENTOLIN HFA) 108 (90 BASE) MCG/ACT inhaler 2 puffs every 4-6 hours as needed (Patient not taking: Reported on 07/26/2015) 1 Inhaler 11  . albuterol (PROVENTIL) (2.5 MG/3ML) 0.083% nebulizer solution Take 3 mLs (2.5 mg total) by nebulization every 6 (six) hours as needed for wheezing or shortness of breath. (Patient not taking: Reported on 07/26/2015) 75 mL 5  . ipratropium (ATROVENT) 0.06 % nasal spray USE 1 OR 2 SPRAYS IN EACH NOSTRIL UP TO FOUR TIMES DAILY AS NEEDED (Patient not taking: Reported on 07/26/2015) 120 mL 61  . nitroGLYCERIN (NITROSTAT) 0.4 MG SL tablet Place 0.4 mg under the tongue every 5 (five) minutes as needed for chest pain.     [name] Umeclidinium-Vilanterol (ANORO ELLIPTA) 62.5-25 MCG/INH AEPB 1 puff once daily (Patient not taking: Reported on 07/26/2015) 60 each 3   No  current facility-administered medications on file prior to visit.    ALLERGIES: No Known Allergies  FAMILY HISTORY: Family History  Problem Relation Age of Onset  . Stroke Mother     SOCIAL HISTORY: Social History   Social History  . Marital Status: Married    Spouse Name: N/A  . Number of Children: 3  . Years of Education: N/A   Occupational History  . Retired    Social History Main Topics  . Smoking status: Former Smoker -- 2.00 packs/day for 35 years    Types: Cigarettes    Quit date: 10/08/1985  . Smokeless tobacco: Former Systems developer    Types: Chew    Quit date: 10/08/1997  . Alcohol Use: No  . Drug Use: No  . Sexual Activity: Not Currently   Other Topics Concern  . Not on file   Social History Narrative    REVIEW OF SYSTEMS: Constitutional: No fevers, chills, or sweats, no generalized fatigue, change in appetite Eyes: No visual changes,  double vision, eye pain Ear, nose and throat: No hearing loss, ear pain, nasal congestion, sore throat Cardiovascular: No chest pain, palpitations Respiratory:  No shortness of breath at rest or with exertion, wheezes GastrointestinaI: No nausea, vomiting, diarrhea, abdominal pain, fecal incontinence Genitourinary:  No dysuria, urinary retention or frequency Musculoskeletal:  No neck pain, back pain Integumentary: No rash, pruritus, skin lesions Neurological: as above Psychiatric: No depression, insomnia, anxiety Endocrine: No palpitations, fatigue, diaphoresis, mood swings, change in appetite, change in weight, increased thirst Hematologic/Lymphatic:  No anemia, purpura, petechiae. Allergic/Immunologic: no itchy/runny eyes, nasal congestion, recent allergic reactions, rashes  PHYSICAL EXAM: Filed Vitals:   07/26/15 1049  BP: 98/66  Pulse: 56   General: No acute distress Head:  Normocephalic/atraumatic Eyes: Fundoscopic exam shows bilateral sharp discs, no vessel changes, exudates, or hemorrhages Neck: supple, no  paraspinal tenderness, full range of motion Back: No paraspinal tenderness Heart: regular rate and rhythm Lungs: Clear to auscultation bilaterally. Vascular: No carotid bruits. Skin/Extremities: No rash, no edema Neurological Exam: Mental status: alert and oriented to person, place, and time, no dysarthria or aphasia, Fund of knowledge is appropriate.  Recent and remote memory are intact.  Attention and concentration are normal.    Able to name objects and repeat phrases. Clock drawing test 5/5. MMSE - Mini Mental State Exam 07/26/2015  Orientation to time 4  Orientation to Place 4  Registration 3  Attention/ Calculation 4  Recall 1  Language- name 2 objects 2  Language- repeat 1  Language- follow 3 step command 3  Language- read & follow direction 1  Write a sentence 1  Copy design 1  Total score 25   Cranial nerves: CN I: not tested CN II: pupils equal, round and reactive to light, visual fields intact, fundi unremarkable. CN III, IV, VI:  full range of motion, no nystagmus, no ptosis CN V: facial sensation intact CN VII: upper and lower face symmetric CN VIII: hearing intact to finger rub CN IX, X: gag intact, uvula midline CN XI: sternocleidomastoid and trapezius muscles intact CN XII: tongue midline Bulk & Tone: normal, no fasciculations. Motor: 5/5 throughout with no pronator drift. Sensation: decreased vibration to bilateral ankles, reports hyperesthesia to pin on both feet, otherwise intact to light touch, cold, pin on both UE. No extinction to double simultaneous stimulation.  Romberg test slight sway Deep Tendon Reflexes: +2 throughout except for absent ankle jerks bilaterally, no ankle clonus Plantar responses: downgoing bilaterally Cerebellar: no incoordination on finger to nose, heel to shin. No dysdiadochokinesia Gait: narrow-based and steady, able to tandem walk adequately. Tremor: none  IMPRESSION: This is a pleasant 81 year old right-handed man with a  history of hypertension, hyperlipidemia, diabetes, COPD, with worsening memory and recent stroke last 07/15/15. Stroke appears to be due to small vessel disease, stroke workup unremarkable. He is now on full dose aspirin and higher dose statin, we discussed the importance of control of vascular risk factors, and he knows to go to the ER immediately if symptoms change. We discussed memory issues, MMSE today 25/30, indicating mild cognitive impairment. Mild cognitive impairment means there are serious cognitive problems by report and testing but the patient is functioning normally. Around 50% of MCI patients progress to dementia (functional impairment) over 5 years. Dementia implies that ADLs are currently compromised. He may benefit from starting low dose cholinesterase inhibitors such as Aricept 5mg  daily, side effects and expectations from the medication were discussed. We discussed [place] driving laws and memory loss, would  recommend a DMV driving assessment before returning to driving. We discussed the importance of physical exercise and brain stimulation exercises for brain health. He will follow-up in 6 months and knows to call our office for any problems.   Thank you for allowing me to participate in the care of this patient. Please do not hesitate to call for any questions or concerns.   Ellouise Newer, M.D.

## 2015-07-26 NOTE — Patient Instructions (Addendum)
1. Start Aricept 5mg : Take 1/2 tablet daily for 1 week, then increase to 1 tablet daily 2. Continue daily aspirin, control of BP, cholesterol 3. If you would like to return to driving, would recommend you do a Driving Assessment with the DMV first 4. Physical exercise and brain stimulation exercises are important for brain health 5. If any change in symptoms, go to ER immediately 6. Follow-up in 6 months

## 2015-08-03 ENCOUNTER — Encounter: Payer: Self-pay | Admitting: Internal Medicine

## 2015-08-08 ENCOUNTER — Encounter: Payer: Self-pay | Admitting: Family

## 2015-08-08 ENCOUNTER — Ambulatory Visit (INDEPENDENT_AMBULATORY_CARE_PROVIDER_SITE_OTHER): Payer: Medicare Other | Admitting: Family

## 2015-08-08 VITALS — BP 140/84 | HR 53 | Temp 98.3°F | Resp 18 | Ht 71.0 in | Wt 178.8 lb

## 2015-08-08 DIAGNOSIS — I10 Essential (primary) hypertension: Secondary | ICD-10-CM

## 2015-08-08 DIAGNOSIS — E785 Hyperlipidemia, unspecified: Secondary | ICD-10-CM

## 2015-08-08 DIAGNOSIS — E119 Type 2 diabetes mellitus without complications: Secondary | ICD-10-CM | POA: Diagnosis not present

## 2015-08-08 NOTE — Assessment & Plan Note (Signed)
Current A1c of 6.8. Stable with metformin and denies adverse side effects or hypoglycemic events. Diabetic eye exam completed earlier. Diabetic foot exam completed today. Maintained on simvastatin for CAD risk reduction. Awaiting results of previous provider for Pneumovax. Continue current dosage of metformin. Follow up A1c in 4 months.

## 2015-08-08 NOTE — Progress Notes (Signed)
Pre visit review using our clinic review tool, if applicable. No additional management support is needed unless otherwise documented below in the visit note. 

## 2015-08-08 NOTE — Assessment & Plan Note (Signed)
Hypertension is well controlled with current regimen. Denies adverse side effects or hypertensive events. Encouraged to monitor blood pressure at home. Continue current dosage of indapamide. Follow-up in 4 months.

## 2015-08-08 NOTE — Assessment & Plan Note (Signed)
Stable with current dosage of simvastatin. Denies adverse side effects or myalgias. Continue current dosage of simvastatin for hyperlipidemia and CAD risk reduction.

## 2015-08-08 NOTE — Progress Notes (Signed)
Subjective:    Patient ID: Luis [name], male    DOB: 14-Jan-1934, 81 y.o.   MRN: [id number]  Chief Complaint  Patient presents with  . Establish Care    HPI:  Luis [name] is a 81 y.o. male who  has a past medical history of COPD (chronic obstructive pulmonary disease) ([place]); Hypertension; Dysphagia; High cholesterol; Emphysema; Emphysema of lung ([place]); GERD (gastroesophageal reflux disease); Heart murmur; Exertional shortness of breath; and Diabetes mellitus without complication ([place]). and presents today for an office visit to establish care.   1.) Diabetes - Currently maintained on metformin. Takes the medications as prescribed and denies adverse effects or hypoglycemic. Home blood sugars are ranging are 120. Eye exam was completed today. Currently maintained on simvastatin for CAD risk reduction. Not currently maintained on an ACE/ARB. Due for a foot exam - has been followed by Dr. Berline Lopes.  Lab Results  Component Value Date   HGBA1C 6.8* 07/16/2015    2.) Hyperlipidemia - Currently maintained on simvastatin. Takes the medication as prescribed and denies adverse side effects or myalgias.   Lab Results  Component Value Date   CHOL 164 07/16/2015   HDL 49 07/16/2015   LDLCALC 88 07/16/2015   TRIG 137 07/16/2015   CHOLHDL 3.3 07/16/2015    3.) Hypertension - currently maintained on indapamide. Takes the medications as prescribed and denies adverse side effects or hypertensive events. Does have chronically low sodium. Does not take his blood pressure at home currently. Recent eye exam completed.  BP Readings from Last 3 Encounters:  08/08/15 140/84  07/26/15 98/66  07/18/15 162/85     No Known Allergies   Outpatient Prescriptions Prior to Visit  Medication Sig Dispense Refill  . aspirin EC 325 MG tablet Take 1 tablet (325 mg total) by mouth daily. 30 tablet 0  . Cholecalciferol (D3 MAXIMUM STRENGTH) 5000 UNITS capsule Take 5,000 Units by mouth daily.    [name] donepezil  (ARICEPT) 5 MG tablet Take 1/2 tablet daily for 1 week, then increase to 1 tablet daily 30 tablet 11  . Ginkgo Biloba (GINKOBA PO) Take 120 mg by mouth daily.    . indapamide (LOZOL) 2.5 MG tablet Take 2.5 mg by mouth daily.    . metFORMIN (GLUCOPHAGE) 500 MG tablet Take 500 mg by mouth daily.  2  . nitroGLYCERIN (NITROSTAT) 0.4 MG SL tablet Place 0.4 mg under the tongue every 5 (five) minutes as needed for chest pain.     [name] omeprazole (PRILOSEC) 20 MG capsule Take 20 mg by mouth daily.    . polyethylene glycol (MIRALAX / GLYCOLAX) packet Take 17 g by mouth daily.    [name] PREVIDENT 5000 BOOSTER PLUS 1.1 % PSTE 3 (three) times daily. as directed  99  . simvastatin (ZOCOR) 20 MG tablet Take 1 tablet (20 mg total) by mouth daily at 6 PM. 1/2 tablet daily. 30 tablet 1  . theophylline (UNIPHYL) 400 MG 24 hr tablet Take 200 mg by mouth 3 (three) times daily.  11  . Umeclidinium-Vilanterol (ANORO ELLIPTA) 62.5-25 MCG/INH AEPB 1 puff once daily 60 each 3  . albuterol (PROVENTIL HFA;VENTOLIN HFA) 108 (90 BASE) MCG/ACT inhaler 2 puffs every 4-6 hours as needed 1 Inhaler 11  . albuterol (PROVENTIL) (2.5 MG/3ML) 0.083% nebulizer solution Take 3 mLs (2.5 mg total) by nebulization every 6 (six) hours as needed for wheezing or shortness of breath. 75 mL 5  . ipratropium (ATROVENT) 0.06 % nasal spray USE 1 OR 2 SPRAYS  IN EACH NOSTRIL UP TO FOUR TIMES DAILY AS NEEDED 120 mL 61  . predniSONE (DELTASONE) 10 MG tablet Take 40mg  po daily for 2 days then 30mg  daily for 2 days then 20mg  daily for 2 days then 10mg  daily for 2 days then stop 20 tablet 0   No facility-administered medications prior to visit.     Past Medical History  Diagnosis Date  . COPD (chronic obstructive pulmonary disease) ([place])   . Hypertension   . Dysphagia   . High cholesterol   . Emphysema   . Emphysema of lung ([place])   . GERD (gastroesophageal reflux disease)   . Heart murmur     as a child  . Exertional shortness of breath   . Diabetes  mellitus without complication [facility])      Past Surgical History  Procedure Laterality Date  . Retinal detachment surgery Right 08/29/2010  . Esophageal dilation  2000's    x2  . Eye surgery    . Inguinal hernia repair Right 1970's  . Inguinal hernia repair Bilateral 03/12/2013    w/mesh bilaterally/notes 03/12/2013  . Tonsillectomy  1940's  . Transurethral resection of prostate  2002; 2013  . Cataract extraction w/ intraocular lens implant Left 2011  . Inguinal hernia repair Bilateral 03/12/2013    Procedure: HERNIA REPAIR INGUINAL ADULT BILATERAL;  Surgeon: Merrie Roof, MD;  Location: [place];  Service: General;  Laterality: Bilateral;  . Insertion of mesh Bilateral 03/12/2013    Procedure: INSERTION OF MESH;  Surgeon: Merrie Roof, MD;  Location: [facility];  Service: General;  Laterality: Bilateral;     Family History  Problem Relation Age of Onset  . Stroke Mother   . Healthy Father      Social History   Social History  . Marital Status: Married    Spouse Name: N/A  . Number of Children: 3  . Years of Education: 12   Occupational History  . Retired    Social History Main Topics  . Smoking status: Former Smoker -- 2.00 packs/day for 35 years    Types: Cigarettes    Quit date: 10/08/1985  . Smokeless tobacco: Former Systems developer    Types: Chew    Quit date: 10/08/1997  . Alcohol Use: No  . Drug Use: No  . Sexual Activity: Not Currently   Other Topics Concern  . Not on file   Social History Narrative   Fun: Golf   Denies abuse and feels safe at home.     Review of Systems  Constitutional: Negative for fever and chills.  Respiratory: Negative for chest tightness and shortness of breath.   Cardiovascular: Negative for chest pain, palpitations and leg swelling.  Endocrine: Negative for polydipsia, polyphagia and polyuria.  Neurological: Negative for weakness and numbness.      Objective:    BP 140/84 mmHg  Pulse 53  Temp(Src) 98.3 F (36.8 C) (Oral)  Resp 18   Ht 5\' 11"  (1.803 m)  Wt 178 lb 12.5 oz (81.094 kg)  BMI 24.95 kg/m2  SpO2 97% Nursing note and vital signs reviewed.  Physical Exam  Constitutional: He is oriented to person, place, and time. He appears well-developed and well-nourished. No distress.  Cardiovascular: Normal rate, regular rhythm, normal heart sounds and intact distal pulses.   Pulmonary/Chest: Effort normal and breath sounds normal.  Neurological: He is alert and oriented to person, place, and time.  Diabetic foot exam - bilateral feet are free from skin breakdown, cuts,  and abrasions. Toenails are neatly trimmed. Pulses are intact and appropriate. Sensation is intact to monofilament bilaterally - with mild decreased sensation of the left great toe.    Skin: Skin is warm and dry.  Psychiatric: He has a normal mood and affect. His behavior is normal. Judgment and thought content normal.       Assessment & Plan:   Problem List Items Addressed This Visit      Cardiovascular and Mediastinum   Essential hypertension    Hypertension is well controlled with current regimen. Denies adverse side effects or hypertensive events. Encouraged to monitor blood pressure at home. Continue current dosage of indapamide. Follow-up in 4 months.        Endocrine   Diabetes mellitus ([place])    Current A1c of 6.8. Stable with metformin and denies adverse side effects or hypoglycemic events. Diabetic eye exam completed earlier. Diabetic foot exam completed today. Maintained on simvastatin for CAD risk reduction. Awaiting results of previous provider for Pneumovax. Continue current dosage of metformin. Follow up A1c in 4 months.         Other   Hyperlipidemia - Primary    Stable with current dosage of simvastatin. Denies adverse side effects or myalgias. Continue current dosage of simvastatin for hyperlipidemia and CAD risk reduction.

## 2015-08-08 NOTE — Patient Instructions (Signed)
Thank you for choosing [facility].  Summary/Instructions:  Please continue to take your medications as prescribed.  Please follow up for an annual exam/physical/wellness visit at your convenience.

## 2015-08-11 ENCOUNTER — Telehealth: Payer: Self-pay | Admitting: *Deleted

## 2015-08-11 MED ORDER — ASPIRIN EC 325 MG PO TBEC: 325.0000 mg | DELAYED_RELEASE_TABLET | Freq: Every day | ORAL | Status: DC

## 2015-08-11 NOTE — Telephone Encounter (Signed)
Left msg on triage wanting to know has Luis [name] received medical records from previous md, also need refill on his aspirin. Called wife back inform her no records has been received. Ask if they have sign medical release form to get records. Wife states no we was told the nurse would have forms but was never given. She states they are coming to [facility] tomorrow want to stop by office to have medical release form sign. Inform her will fill-out and leave at the front desk...[name]

## 2015-08-18 ENCOUNTER — Ambulatory Visit: Payer: Medicare Other | Admitting: Neurology

## 2015-09-14 ENCOUNTER — Telehealth: Payer: Self-pay | Admitting: Internal Medicine

## 2015-09-14 MED ORDER — DOXYCYCLINE HYCLATE 100 MG PO TABS: ORAL_TABLET | ORAL | Status: DC

## 2015-09-14 NOTE — Telephone Encounter (Signed)
[phone] or [phone] pt cb

## 2015-09-14 NOTE — Telephone Encounter (Signed)
Offer doxycycline 100 mg, # 8, 2 today then one daily 

## 2015-09-14 NOTE — Telephone Encounter (Signed)
lmtcb X1 for pt/wife to make aware of recs.  Doxy called into below stated pharmacy.

## 2015-09-14 NOTE — Telephone Encounter (Signed)
Called and spoke with patient's wife. Informed her of CY's recs. She voiced understanding and had no further questions. RX sent to pharmacy,. Nothing further needed.

## 2015-09-14 NOTE — Telephone Encounter (Signed)
Message was never sent to triage when called in this AM. Called spoke with spouse. She reports pt is having a lot of chest congestion, prod cough (thick yellow phlem), SOB unchanged. No chest tx, no wheezing. No f/n/v/s/c. Pt taking mucinex daily. Please advise Dr. Annamaria Boots thanks  No Known Allergies   Current Outpatient Prescriptions on File Prior to Visit  Medication Sig Dispense Refill  . aspirin EC 325 MG tablet Take 1 tablet (325 mg total) by mouth daily. 90 tablet 3  . Cholecalciferol (D3 MAXIMUM STRENGTH) 5000 UNITS capsule Take 5,000 Units by mouth daily.    [name] donepezil (ARICEPT) 5 MG tablet Take 1/2 tablet daily for 1 week, then increase to 1 tablet daily 30 tablet 11  . Ginkgo Biloba (GINKOBA PO) Take 120 mg by mouth daily.    . indapamide (LOZOL) 2.5 MG tablet Take 2.5 mg by mouth daily.    . metFORMIN (GLUCOPHAGE) 500 MG tablet Take 500 mg by mouth daily.  2  . nitroGLYCERIN (NITROSTAT) 0.4 MG SL tablet Place 0.4 mg under the tongue every 5 (five) minutes as needed for chest pain.     [name] omeprazole (PRILOSEC) 20 MG capsule Take 20 mg by mouth daily.    . polyethylene glycol (MIRALAX / GLYCOLAX) packet Take 17 g by mouth daily.    [name] PREVIDENT 5000 BOOSTER PLUS 1.1 % PSTE 3 (three) times daily. as directed  99  . simvastatin (ZOCOR) 20 MG tablet Take 1 tablet (20 mg total) by mouth daily at 6 PM. 1/2 tablet daily. 30 tablet 1  . theophylline (UNIPHYL) 400 MG 24 hr tablet Take 200 mg by mouth 3 (three) times daily.  11  . Umeclidinium-Vilanterol (ANORO ELLIPTA) 62.5-25 MCG/INH AEPB 1 puff once daily 60 each 3   No current facility-administered medications on file prior to visit.

## 2015-09-20 ENCOUNTER — Ambulatory Visit: Payer: Medicare Other | Admitting: *Deleted

## 2015-09-22 ENCOUNTER — Other Ambulatory Visit: Payer: Self-pay | Admitting: Family

## 2015-09-26 ENCOUNTER — Telehealth: Payer: Self-pay | Admitting: Internal Medicine

## 2015-09-26 NOTE — Telephone Encounter (Signed)
Patient has bad cough.  Chest is full of mucus.  Wheezing.  Uses oxygen at night. Patient will have baby around during the holidays and wants to know if he can be seen tomorrow around 11am.  Pt's wife has appointment to have her allergy shot tomorrow around 11am.  She wanted to know if he can be seen tomorrow as well.    Luis [name] - there is a held spot for 11:30 tomorrow.  Can we use this spot?  Please advise.

## 2015-09-26 NOTE — Telephone Encounter (Signed)
Spoke with pt's wife.  Discussed below.  Appt scheduled for pt with CY for tomorrow at 10 am.  Wife aware, is in agreement with plan, and voiced no further questions or concerns at this time.

## 2015-09-26 NOTE — Telephone Encounter (Signed)
See if patient can come in one of the held spots (9:45a or 10:00am) and pts wife can come by for her allergy shot at the same time-she does not have to come in at her scheduled time for allergy shots. Thanks.

## 2015-09-27 ENCOUNTER — Encounter: Payer: Self-pay | Admitting: Internal Medicine

## 2015-09-27 ENCOUNTER — Ambulatory Visit (INDEPENDENT_AMBULATORY_CARE_PROVIDER_SITE_OTHER): Payer: Medicare Other | Admitting: Internal Medicine

## 2015-09-27 VITALS — BP 112/62 | HR 60 | Ht 71.0 in | Wt 183.0 lb

## 2015-09-27 DIAGNOSIS — J449 Chronic obstructive pulmonary disease, unspecified: Secondary | ICD-10-CM

## 2015-09-27 MED ORDER — METHYLPREDNISOLONE ACETATE 80 MG/ML IJ SUSP
80.0000 mg | Freq: Once | INTRAMUSCULAR | Status: AC
Start: 2015-09-27 — End: 2015-09-27
  Administered 2015-09-27: 80 mg via INTRAMUSCULAR

## 2015-09-27 MED ORDER — LEVALBUTEROL HCL 0.63 MG/3ML IN NEBU
0.6300 mg | INHALATION_SOLUTION | Freq: Once | RESPIRATORY_TRACT | Status: AC
  Administered 2015-09-27: 0.63 mg via RESPIRATORY_TRACT

## 2015-09-27 MED ORDER — ALBUTEROL SULFATE HFA 108 (90 BASE) MCG/ACT IN AERS: 2.0000 | INHALATION_SPRAY | Freq: Four times a day (QID) | RESPIRATORY_TRACT | Status: DC | PRN

## 2015-09-27 MED ORDER — ALBUTEROL SULFATE (2.5 MG/3ML) 0.083% IN NEBU: 2.5000 mg | INHALATION_SOLUTION | Freq: Four times a day (QID) | RESPIRATORY_TRACT | Status: DC | PRN

## 2015-09-27 NOTE — Progress Notes (Signed)
10/27/12- 81 yo M former smoker- Pt referred by Dr Manuella Ghazi. pt states having sob upon heavy activity. Wife is a patient of mine. He had smoked one to 2 packs per day for 35 years, ending in 100. 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 He was diagnosed with COPD/emphysema by pulmonologist Dr. Koleen Nimrod from [place]. Shortness of breath with exertion for years is getting gradually worse. Cough occasionally productive of clear mucus with no blood. He can climb one flight of stairs without stopping- he's breathing hard at the top. No chest pain, palpitation or swelling. He has not noticed dramatic benefit from tudorza, albuterol HFA,, Advair 250. He also complains of anosmia for at least several years. He blames this on working with ammonia fumes. He is using Flonase and Atrovent nasal sprays Father died of DVT/PE mother died of stroke. He doesn't know of any family members with lung disease.  // needs a1AT//  11/27/12- 78 yoM former smoker followed for COPD       PCP Dr Monico Blitz FOLLOWS FOR: reivew PFT and 6MW with patient; still having SOB with heavy activity. Wife here. No change in no acute event since last here. Persistent dyspnea with exertion but he does 30 minutes daily at 3 miles per hour on treadmill. Likes ipratropium nasal spray. Has had pneumonia vaccine after age 68. CXR 10/28/12 IMPRESSION:  COPD. No active lung disease.  Original Report Authenticated By: Ivar Drape, M.D. PFT2/5/14- severe obstructive airways disease with slight response to bronchodilator. Air-trapping with increased residual volume. Diffusion mildly reduced. Emphysema pattern. FVC 2.56/59%, FEV1 1.17/41%, FEV1/FVC 0.45. Residual volume 132%, DLCO 78%. 6MWT-11/12/12- 96%, 94%, 96%, 454 m. Good distance with oxygenation well maintained.  06/02/13-79 yoM former smoker followed for COPD       PCP Dr Monico Blitz     Wife here FOLLOWS FOR: feels like he may be a  little worse than last visit with breathing; SOB with activity. Has questions/concerns with Tudorza-has cough and doesnt feel as well while taking it. Dyspnea on exertion is not improved. Has cough, productive clear phlegm.  Urinary syncope episode. a1AT- WNL MM 142  12/02/13- 79 yoM former smoker followed for COPD       PCP Dr Monico Blitz     Wife here FOLLOWS FOR: Pt states his breathing is not any better since last OV.  SOB with activity as well. Dyspnea with climbing 2 flights of stairs and has to stop to get his breath before going on. This is unchanged. Some cough with white phlegm. Denies chest pain, palpitations or edema. Uses treadmill and step exerciser some.  01/22/14 Acute OV Complains of increased SOB x2 weeks with prod cough with yellow mucus.  But says it is clear to yellow at times. No fever. Denies any wheezing, chest tightness, hemoptysis, f/c/s, nausea, vomiting.   Sats were 89% upon entering exam room. After rest 93% on RA. We discussed oxygen and he wants to wait for this .  CXR w/ no acute process , stable COPD 12/02/13  03/04/14- 79 yoM former smoker followed for Severe COPD, vasomotor rhinitis       PCP Dr Monico Blitz     Wife here FOLLOWS FOR: Pt states breathing is unchanged. Pt c/o of SOB with exertion and intermittent productive cough with yellow mucous. Denied CP/tightness.  Dyspnea doing yard work, climbing hills. Comfortable at rest and with ADLs. Home oximetry usually 90%, rarely 85 on  room air. Says cardiac tests were negative in the past.  07/05/14- 80 yoM former smoker followed for Severe COPD, vasomotor rhinitis       PCP Dr Monico Blitz     Wife here Has had flu shot FOLLOWS FOR: patient stays congested(morning time); SOB or wheezing with activity. Patient has been losing weight-recently dx'd with DM-diet controlled.   Has had flu vax Some wheeze. Daily light cough productive clear. Less DOE. Continuing theodur, Advair [place], Tudorza.  01/03/15- 80 yoM former  smoker followed for Severe COPD/ emphysema, vasomotor rhinitis       PCP Dr Monico Blitz     Wife here FOLLOWS FOR: Pt states he is fine when sitting(rest) but with exertion he gets SOB and wheezing.  09/27/2015-81 year old male former smoker followed for severe COPD/emphysema, vasomotor rhinitis FOLLOWS FOR:  Pt completed Doxy 100 x 1 week ago. Pt reports increased deep wet cough, SOB wheezing and chest congestion x 2-3 weeks.    CXR 1 view 07/15/2015 IMPRESSION: No acute cardiopulmonary process seen. Electronically Signed  By: Garald Balding M.D.  On: 07/15/2015 21:22   ROS-see HPI Constitutional:   No-   weight loss, night sweats, fevers, chills, fatigue, lassitude. HEENT:   No-  headaches, difficulty swallowing, tooth/dental problems, sore throat,       No-  sneezing, itching, ear ache, nasal congestion, post nasal drip,  CV:  No-   chest pain, orthopnea, PND, swelling in lower extremities, anasarca,                                                                            dizziness, palpitations Resp: +shortness of breath with exertion or at rest.             +productive cough,  No non-productive cough,  No- coughing up of blood.              No-   change in color of mucus.  No- wheezing.   Skin: No-   rash or lesions. GI:  No-   heartburn, indigestion, abdominal pain, nausea, vomiting, GU: . MS:  No-   joint pain or swelling.  . Neuro-     nothing unusual Psych:  No- change in mood or affect. No depression or anxiety.  No memory loss.  OBJ- Physical Exam General- Alert, Oriented, Affect-appropriate, Distress- none acute, trim Skin- rash-none, lesions- none, excoriation- none Lymphadenopathy- none Head- atraumatic            Eyes- Gross vision intact, PERRLA, conjunctivae and secretions clear            Ears- Hearing, canals-normal            Nose- Clear, no-Septal dev, mucus, polyps, erosion, perforation             Throat- Mallampati II , mucosa clear , drainage- none,  tonsils- atrophic Neck- flexible , trachea midline, no stridor , thyroid nl, carotid no bruit Chest - symmetrical excursion , unlabored           Heart/CV- RRR , no murmur , no gallop  , no rub, nl s1 s2                           -  JVD- none , edema- none, stasis changes- none, varices- none           Lung- clear to P&A/ diminished, wheeze- none, cough- none , dullness-none, rub- none           Chest wall-  Abd-  Br/ Gen/ Rectal- Not done, not indicated Extrem- cyanosis- none, clubbing, none, atrophy- none, strength- nl Neuro- grossly intact to observation

## 2015-09-27 NOTE — Patient Instructions (Signed)
Neb xop 0.63      Dx COPD mixed type  Depo 29  Ok to try Mucinex-DM otc for the cough and rattle  If you don't get better in the next few days please let me know

## 2015-10-11 ENCOUNTER — Telehealth: Payer: Self-pay | Admitting: Internal Medicine

## 2015-10-11 MED ORDER — DOXYCYCLINE HYCLATE 100 MG PO TABS: ORAL_TABLET | ORAL | Status: DC

## 2015-10-11 NOTE — Telephone Encounter (Signed)
Offer doxycycline 1oo mg, # 8, 2 today then one daily  Mucinex DM may also help

## 2015-10-11 NOTE — Telephone Encounter (Signed)
Patient's wife notified of CY's recommendations. Patient's wife wants to know if patient can have a prescription for Atrovent to go along with his neb medications. She would like to pick it up when she picks up the antibiotics.  Current Outpatient Prescriptions on File Prior to Visit  Medication Sig Dispense Refill  . albuterol (PROVENTIL HFA;VENTOLIN HFA) 108 (90 BASE) MCG/ACT inhaler Inhale 2 puffs into the lungs every 6 (six) hours as needed for wheezing or shortness of breath. 1 Inhaler 11  . albuterol (PROVENTIL) (2.5 MG/3ML) 0.083% nebulizer solution Take 3 mLs (2.5 mg total) by nebulization every 6 (six) hours as needed for wheezing or shortness of breath. 75 mL 12  . aspirin EC 325 MG tablet Take 1 tablet (325 mg total) by mouth daily. 90 tablet 3  . Cholecalciferol (D3 MAXIMUM STRENGTH) 5000 UNITS capsule Take 5,000 Units by mouth daily.    [name] donepezil (ARICEPT) 5 MG tablet Take 1/2 tablet daily for 1 week, then increase to 1 tablet daily 30 tablet 11  . Ginkgo Biloba (GINKOBA PO) Take 120 mg by mouth daily.    . indapamide (LOZOL) 2.5 MG tablet Take 2.5 mg by mouth daily.    . metFORMIN (GLUCOPHAGE) 500 MG tablet Take 500 mg by mouth daily.  2  . nitroGLYCERIN (NITROSTAT) 0.4 MG SL tablet Place 0.4 mg under the tongue every 5 (five) minutes as needed for chest pain.     [name] omeprazole (PRILOSEC) 20 MG capsule Take 20 mg by mouth daily.    . polyethylene glycol (MIRALAX / GLYCOLAX) packet Take 17 g by mouth daily.    . simvastatin (ZOCOR) 20 MG tablet TAKE ONE TABLET BY MOUTH DAILY AT 6 PM 90 tablet 0  . theophylline (UNIPHYL) 400 MG 24 hr tablet Take 200 mg by mouth 3 (three) times daily.  11  . Umeclidinium-Vilanterol (ANORO ELLIPTA) 62.5-25 MCG/INH AEPB 1 puff once daily 60 each 3   No current facility-administered medications on file prior to visit.   No Known Allergies

## 2015-10-11 NOTE — Telephone Encounter (Signed)
Spoke with pt's wife. Reports that pt has developed a cough and chest congestion. This started last night and kept him awake. Cough is non productive at this time. Denies chest tightness, wheezing or increased SOB. Would like to have something called in. CY - please advise. Thanks.  No Known Allergies Current Outpatient Prescriptions on File Prior to Visit  Medication Sig Dispense Refill  . albuterol (PROVENTIL HFA;VENTOLIN HFA) 108 (90 BASE) MCG/ACT inhaler Inhale 2 puffs into the lungs every 6 (six) hours as needed for wheezing or shortness of breath. 1 Inhaler 11  . albuterol (PROVENTIL) (2.5 MG/3ML) 0.083% nebulizer solution Take 3 mLs (2.5 mg total) by nebulization every 6 (six) hours as needed for wheezing or shortness of breath. 75 mL 12  . aspirin EC 325 MG tablet Take 1 tablet (325 mg total) by mouth daily. 90 tablet 3  . Cholecalciferol (D3 MAXIMUM STRENGTH) 5000 UNITS capsule Take 5,000 Units by mouth daily.    [name] donepezil (ARICEPT) 5 MG tablet Take 1/2 tablet daily for 1 week, then increase to 1 tablet daily 30 tablet 11  . Ginkgo Biloba (GINKOBA PO) Take 120 mg by mouth daily.    . indapamide (LOZOL) 2.5 MG tablet Take 2.5 mg by mouth daily.    . metFORMIN (GLUCOPHAGE) 500 MG tablet Take 500 mg by mouth daily.  2  . nitroGLYCERIN (NITROSTAT) 0.4 MG SL tablet Place 0.4 mg under the tongue every 5 (five) minutes as needed for chest pain.     [name] omeprazole (PRILOSEC) 20 MG capsule Take 20 mg by mouth daily.    . polyethylene glycol (MIRALAX / GLYCOLAX) packet Take 17 g by mouth daily.    . simvastatin (ZOCOR) 20 MG tablet TAKE ONE TABLET BY MOUTH DAILY AT 6 PM 90 tablet 0  . theophylline (UNIPHYL) 400 MG 24 hr tablet Take 200 mg by mouth 3 (three) times daily.  11  . Umeclidinium-Vilanterol (ANORO ELLIPTA) 62.5-25 MCG/INH AEPB 1 puff once daily 60 each 3   No current facility-administered medications on file prior to visit.

## 2015-10-11 NOTE — Telephone Encounter (Signed)
Left message for patient's wife Remo Lipps) to call back.

## 2015-10-11 NOTE — Telephone Encounter (Signed)
Is he asking for an Atrovent inhaler  Then ok # 1, 2 puffs every 6 hours rescue inhaler. Refill # 11  If they are asking for addition of Atrovent to his nebulizer solution, then simplest to change his script for neb albuterol to Duoneb (ipratropium plus albuterol), same number and instructions as he is using for his current nebulizer medication.

## 2015-10-12 MED ORDER — IPRATROPIUM-ALBUTEROL 0.5-2.5 (3) MG/3ML IN SOLN: 3.0000 mL | Freq: Four times a day (QID) | RESPIRATORY_TRACT | Status: DC | PRN

## 2015-10-12 NOTE — Telephone Encounter (Signed)
Spoke with the pt's spouse She states that she was referring to atrovent neb solution  I advised will call in Duoneb 1 in neb every 6 hours if needed and d/c plain albuterol  She verbalized understanding  Rx was sent to pharm

## 2015-10-20 ENCOUNTER — Ambulatory Visit (INDEPENDENT_AMBULATORY_CARE_PROVIDER_SITE_OTHER): Payer: Medicare Other | Admitting: Internal Medicine

## 2015-10-20 ENCOUNTER — Encounter: Payer: Self-pay | Admitting: Internal Medicine

## 2015-10-20 VITALS — BP 118/72 | HR 81 | Ht 71.0 in | Wt 179.8 lb

## 2015-10-20 DIAGNOSIS — J432 Centrilobular emphysema: Secondary | ICD-10-CM

## 2015-10-20 DIAGNOSIS — G3184 Mild cognitive impairment, so stated: Secondary | ICD-10-CM

## 2015-10-20 DIAGNOSIS — J449 Chronic obstructive pulmonary disease, unspecified: Secondary | ICD-10-CM

## 2015-10-20 MED ORDER — BENZONATATE 200 MG PO CAPS: 200.0000 mg | ORAL_CAPSULE | Freq: Three times a day (TID) | ORAL | Status: DC | PRN

## 2015-10-20 NOTE — Patient Instructions (Addendum)
Script for benzonatate perles for cough sent  Ok also to use Mucinex-DM or Delsym cough syrup  Please call if we can help  Ok to continue O2 2L for sleep/  [facility]

## 2015-10-20 NOTE — Progress Notes (Signed)
10/27/12- 82 yo M former smoker- Pt referred by Dr Manuella Ghazi. pt states having sob upon heavy activity. Wife is a patient of mine. He had smoked one to 2 packs per day for 35 years, ending in 100. 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 He was diagnosed with COPD/emphysema by pulmonologist Dr. Koleen Nimrod from [place]. Shortness of breath with exertion for years is getting gradually worse. Cough occasionally productive of clear mucus with no blood. He can climb one flight of stairs without stopping- he's breathing hard at the top. No chest pain, palpitation or swelling. He has not noticed dramatic benefit from tudorza, albuterol HFA,, Advair 250. He also complains of anosmia for at least several years. He blames this on working with ammonia fumes. He is using Flonase and Atrovent nasal sprays Father died of DVT/PE mother died of stroke. He doesn't know of any family members with lung disease.  // needs a1AT//  11/27/12- 78 yoM former smoker followed for COPD       PCP Dr Monico Blitz FOLLOWS FOR: reivew PFT and 6MW with patient; still having SOB with heavy activity. Wife here. No change in no acute event since last here. Persistent dyspnea with exertion but he does 30 minutes daily at 3 miles per hour on treadmill. Likes ipratropium nasal spray. Has had pneumonia vaccine after age 68. CXR 10/28/12 IMPRESSION:  COPD. No active lung disease.  Original Report Authenticated By: Ivar Drape, M.D. PFT2/5/14- severe obstructive airways disease with slight response to bronchodilator. Air-trapping with increased residual volume. Diffusion mildly reduced. Emphysema pattern. FVC 2.56/59%, FEV1 1.17/41%, FEV1/FVC 0.45. Residual volume 132%, DLCO 78%. 6MWT-11/12/12- 96%, 94%, 96%, 454 m. Good distance with oxygenation well maintained.  06/02/13-79 yoM former smoker followed for COPD       PCP Dr Monico Blitz     Wife here FOLLOWS FOR: feels like he may be a  little worse than last visit with breathing; SOB with activity. Has questions/concerns with Tudorza-has cough and doesnt feel as well while taking it. Dyspnea on exertion is not improved. Has cough, productive clear phlegm.  Urinary syncope episode. a1AT- WNL MM 142  12/02/13- 79 yoM former smoker followed for COPD       PCP Dr Monico Blitz     Wife here FOLLOWS FOR: Pt states his breathing is not any better since last OV.  SOB with activity as well. Dyspnea with climbing 2 flights of stairs and has to stop to get his breath before going on. This is unchanged. Some cough with white phlegm. Denies chest pain, palpitations or edema. Uses treadmill and step exerciser some.  01/22/14 Acute OV Complains of increased SOB x2 weeks with prod cough with yellow mucus.  But says it is clear to yellow at times. No fever. Denies any wheezing, chest tightness, hemoptysis, f/c/s, nausea, vomiting.   Sats were 89% upon entering exam room. After rest 93% on RA. We discussed oxygen and he wants to wait for this .  CXR w/ no acute process , stable COPD 12/02/13  03/04/14- 79 yoM former smoker followed for Severe COPD, vasomotor rhinitis       PCP Dr Monico Blitz     Wife here FOLLOWS FOR: Pt states breathing is unchanged. Pt c/o of SOB with exertion and intermittent productive cough with yellow mucous. Denied CP/tightness.  Dyspnea doing yard work, climbing hills. Comfortable at rest and with ADLs. Home oximetry usually 90%, rarely 85 on  room air. Says cardiac tests were negative in the past.  07/05/14- 80 yoM former smoker followed for Severe COPD, vasomotor rhinitis       PCP Dr Monico Blitz     Wife here Has had flu shot FOLLOWS FOR: patient stays congested(morning time); SOB or wheezing with activity. Patient has been losing weight-recently dx'd with DM-diet controlled.   Has had flu vax Some wheeze. Daily light cough productive clear. Less DOE. Continuing theodur, Advair [place], Tudorza.  01/03/15- 80 yoM former  smoker followed for Severe COPD/ emphysema, vasomotor rhinitis       PCP Dr Monico Blitz     Wife here FOLLOWS FOR: Pt states he is fine when sitting(rest) but with exertion he gets SOB and wheezing.  09/27/2015-82 year old male former smoker followed for severe COPD/emphysema, vasomotor rhinitis FOLLOWS FOR:  Pt completed Doxy 100 x 1 week ago. Pt reports increased deep wet cough, SOB wheezing and chest congestion x 2-3 weeks.   CXR 1 view 07/15/2015 IMPRESSION: No acute cardiopulmonary process seen. Electronically Signed  By: Garald Balding M.D.  On: 07/15/2015 21:22  10/20/2015-82 year old male former smoker followed for COPD mixed type, vasomotor rhinitis, complicated by CVA/mild cognitive impairment  PCP Dr Monico Blitz     Wife here O2 2L sleep/ [facility] At last OV given xopenex and Depo-Medrol for COPD exacerbation Nurse who did his 6 minute walk came back to express concern that he seemed vague about details and he didn't know whether he has oxygen at home or not  FOLLOWS FOR: Wife states patient has been sick(cough; was tx'd by CY);has been using Duoneb neb tx's-seems to help pt.  6 minute walk test-saturation dropped to 89% on room air by end of walk, 412 m. Borderline but not qualifying for portable O2. Resolving acute bronchitis after doxycycline and Mucinex. Still frequent wheeze.  ROS-see HPI Constitutional:   No-   weight loss, night sweats, fevers, chills, fatigue, lassitude. HEENT:   No-  headaches, difficulty swallowing, tooth/dental problems, sore throat,       No-  sneezing, itching, ear ache, nasal congestion, post nasal drip,  CV:  No-   chest pain, orthopnea, PND, swelling in lower extremities, anasarca,                                                                            dizziness, palpitations Resp: +shortness of breath with exertion or at rest.             +productive cough,  No non-productive cough,  No- coughing up of blood.              No-   change in  color of mucus.  + wheezing.   Skin: No-   rash or lesions. GI:  No-   heartburn, indigestion, abdominal pain, nausea, vomiting, GU: . MS:  No-   joint pain or swelling.  . Neuro-     nothing unusual Psych:  No- change in mood or affect. No depression or anxiety.  No memory loss.  OBJ- Physical Exam General- Alert, Oriented, Affect-appropriate, Distress- none acute, trim Skin- rash-none, lesions- none, excoriation- none Lymphadenopathy- none Head- atraumatic  Eyes- Gross vision intact, PERRLA, conjunctivae and secretions clear            Ears- Hearing, canals-normal            Nose- Clear, no-Septal dev, mucus, polyps, erosion, perforation             Throat- Mallampati II , mucosa clear , drainage- none, tonsils- atrophic Neck- flexible , trachea midline, no stridor , thyroid nl, carotid no bruit Chest - symmetrical excursion , unlabored           Heart/CV- RRR , no murmur , no gallop  , no rub, nl s1 s2                           - JVD- none , edema- none, stasis changes- none, varices- none           Lung- clear to P&A/ diminished, wheeze- none, cough- none , dullness-none, rub- none           Chest wall-  Abd-  Br/ Gen/ Rectal- Not done, not indicated Extrem- cyanosis- none, clubbing, none, atrophy- none, strength- nl Neuro- grossly intact to observation

## 2015-10-21 ENCOUNTER — Encounter: Payer: Self-pay | Admitting: Nutrition

## 2015-10-21 ENCOUNTER — Encounter: Payer: Medicare Other | Attending: Endocrinology | Admitting: Nutrition

## 2015-10-21 VITALS — Ht 71.0 in | Wt 184.0 lb

## 2015-10-21 DIAGNOSIS — E119 Type 2 diabetes mellitus without complications: Secondary | ICD-10-CM | POA: Insufficient documentation

## 2015-10-21 NOTE — Patient Instructions (Signed)
Goals: 1. Follow My Plate Method 2. Increase fresh fruits and low carb vegetables 3. Exercise by walking 15 minutes per day. 4. Cut out snacks late at night or between meals unless a low blood sugar. 5. Be mindful about portion sizes. 6. Eat fruit with meal instead of between meals. 7. Keep up the good job. Your breathing medications maybe contributing to making your blood sugar higher at times. Call MD if blood sugar is over 200 mg/dl three times in a row. Call me if you have any questions or concerns about what to eat or how much at meal times.

## 2015-10-21 NOTE — Progress Notes (Signed)
  Medical Nutrition Therapy:  Appt start time: 1430 end time:  [id number]. Assessment:  Primary concerns today: Diabetes. PMH: COPD/Pulmonary issues, CVA and Type 2 DM.Luis [name] Has a  cough right tnow but it's getting better. LIves with his wife. He notes since he had the CVA in Oct 2016, his memory hasn't been as good. They eat most meals out at family sit down restaurants.. He tests blood sugars once a day a few times per week. FBS ranges 87-140's. Eats fairly balanced meals. Loves pinto beans. Has a treadmill and will start walking some once he ges over his cough. Has been having to use inhaliers often and that is effecting his blood sugars as well. Takes Metformin 500 mg once a day. Diet is fairly well balanced. May be getting extra carbs at dinner and could increase low carb vegetables. Drinks mainly water or unsweet tea.   Preferred Learning Style:   No preference indicated   Learning Readiness:   Ready  Change in progress   MEDICATIONS: See list.   DIETARY INTAKE:  24-hr recall:  B ( AM): Oatmeal with 2 fried eggs, 1 slice toast ww toast and 1 slice tomato. Coffee Snk ( AM):  none  L ( PM): PB's meatloaf, pinto beans and slaw, unsweet tea Snk ( PM):none D ( PM): Turnip green, pinto beans, and country style steak, sweet potato casserole, unsweet tea Snk ( PM): 2 milk or nabs Beverages: water  Usual physical activity:  ADL.  Estimated energy needs: 2000-2200 calories 225 g carbohydrates 150 g protein 56 g fat  Progress Towards Goal(s):  In progress.   Nutritional Diagnosis:  NB-1.1 Food and nutrition-related knowledge deficit As related to Dm.  As evidenced by A1C 7%..    Intervention:  Nutrition and Diabetes education provided on My Plate, CHO counting, meal planning, portion sizes, timing of meals, avoiding snacks between meals unless having a low blood sugar, target ranges for A1C and blood sugars, signs/symptoms and treatment of hyper/hypoglycemia, monitoring blood sugars, taking  medications as prescribed, benefits of exercising 30 minutes per day and prevention of complications of DM. Meal planning low sodium low fat high fiber diet.  Goals: 1. Follow My Plate Method 2. Increase fresh fruits and low carb vegetables 3. Exercise by walking 15 minutes per day. 4. Cut out snacks late at night or between meals unless a low blood sugar. 5. Be mindful about portion sizes. 6. Eat fruit with meal instead of between meals. 7. Keep up the good job. Your breathing medications maybe contributing to making your blood sugar higher at times. Call MD if blood sugar is over 200 mg/dl three times in a row. Call me if you have any questions or concerns about what to eat or how much at meal times.   Teaching Method Utilized:  Visual Auditory Hands on  Handouts given during visit include:  The Plate Method  Meal Plan Card  Diabetes Instructions.   Barriers to learning/adherence to lifestyle change: None  Demonstrated degree of understanding via:  Teach Back   Monitoring/Evaluation:  Dietary intake, exercise, meal planning SB, and body weight prn.

## 2015-10-22 NOTE — Assessment & Plan Note (Signed)
Nurses noted during 6 minute walk test he seemed a little vague about specifics and couldn't remember if he had home oxygen for sleep.

## 2015-10-22 NOTE — Progress Notes (Cosign Needed)
Documentation for 6 minute walk visit   SIX MIN WALK 04/18/2016 10/20/2015 01/22/2014 11/12/2012  Medications - unknown-patient was unable to tell me if he had taken any meds today.  - Advair 230-21 2 puffs, Tudorza 1 puff, Amlodpine 5mg    Supplimental Oxygen during Test? (L/min) No No No No  Laps - 8 - 9  Partial Lap (in Meters) - 28 - 22.5  Baseline BP (sitting) - 116/64 - 110/60  Baseline Heartrate - 77 - 58  Baseline Dyspnea (Borg Scale) - 3 - 3  Baseline Fatigue (Borg Scale) - 2 - 2  Baseline SPO2 - 96 - 96  BP (sitting) - 132/76 - 114/62  Heartrate - 84 - 78  Dyspnea (Borg Scale) - 6 - 3  Fatigue (Borg Scale) - 5 - 3  SPO2 - 89 - 94  BP (sitting) - 128/72 - 108/64  Heartrate - 78 - 74  SPO2 - 97 - 96  Stopped or Paused before Six Minutes - No - No  Interpretation - Leg pain - Leg pain  Distance Completed - 412 - 454.5  Tech Comments: - pt tolerated walk well.  - -

## 2015-10-22 NOTE — Assessment & Plan Note (Signed)
He'll continue oxygen for sleep at night and current meds. Paces himself during exertion. He is still resolving recent acute bronchitis.

## 2015-10-22 NOTE — Patient Instructions (Addendum)
For documentation for 6 minute walk test DX COPD mixed type

## 2015-11-09 ENCOUNTER — Ambulatory Visit (INDEPENDENT_AMBULATORY_CARE_PROVIDER_SITE_OTHER): Payer: Medicare Other | Admitting: Ophthalmology

## 2015-11-09 DIAGNOSIS — H2512 Age-related nuclear cataract, left eye: Secondary | ICD-10-CM | POA: Diagnosis not present

## 2015-11-09 DIAGNOSIS — I10 Essential (primary) hypertension: Secondary | ICD-10-CM

## 2015-11-09 DIAGNOSIS — E11319 Type 2 diabetes mellitus with unspecified diabetic retinopathy without macular edema: Secondary | ICD-10-CM | POA: Diagnosis not present

## 2015-11-09 DIAGNOSIS — E113293 Type 2 diabetes mellitus with mild nonproliferative diabetic retinopathy without macular edema, bilateral: Secondary | ICD-10-CM | POA: Diagnosis not present

## 2015-11-09 DIAGNOSIS — H33302 Unspecified retinal break, left eye: Secondary | ICD-10-CM | POA: Diagnosis not present

## 2015-11-09 DIAGNOSIS — H43813 Vitreous degeneration, bilateral: Secondary | ICD-10-CM

## 2015-11-09 DIAGNOSIS — H338 Other retinal detachments: Secondary | ICD-10-CM | POA: Diagnosis not present

## 2015-11-09 DIAGNOSIS — H35033 Hypertensive retinopathy, bilateral: Secondary | ICD-10-CM | POA: Diagnosis not present

## 2015-11-24 ENCOUNTER — Other Ambulatory Visit: Payer: Self-pay | Admitting: Internal Medicine

## 2015-12-19 ENCOUNTER — Encounter (HOSPITAL_COMMUNITY): Payer: Self-pay | Admitting: Emergency Medicine

## 2015-12-19 ENCOUNTER — Telehealth: Payer: Self-pay | Admitting: Family

## 2015-12-19 ENCOUNTER — Observation Stay (HOSPITAL_COMMUNITY)
Admission: EM | Admit: 2015-12-19 | Discharge: 2015-12-20 | Disposition: A | Discharge status: Home / Self Care | Payer: Medicare Other | Attending: Internal Medicine | Admitting: Internal Medicine

## 2015-12-19 ENCOUNTER — Emergency Department (HOSPITAL_COMMUNITY): Payer: Medicare Other

## 2015-12-19 DIAGNOSIS — Z79899 Other long term (current) drug therapy: Secondary | ICD-10-CM | POA: Insufficient documentation

## 2015-12-19 DIAGNOSIS — E119 Type 2 diabetes mellitus without complications: Secondary | ICD-10-CM

## 2015-12-19 DIAGNOSIS — K625 Hemorrhage of anus and rectum: Principal | ICD-10-CM | POA: Insufficient documentation

## 2015-12-19 DIAGNOSIS — E785 Hyperlipidemia, unspecified: Secondary | ICD-10-CM | POA: Diagnosis not present

## 2015-12-19 DIAGNOSIS — R42 Dizziness and giddiness: Secondary | ICD-10-CM | POA: Diagnosis not present

## 2015-12-19 DIAGNOSIS — G3184 Mild cognitive impairment, so stated: Secondary | ICD-10-CM | POA: Diagnosis present

## 2015-12-19 DIAGNOSIS — Z7984 Long term (current) use of oral hypoglycemic drugs: Secondary | ICD-10-CM | POA: Insufficient documentation

## 2015-12-19 DIAGNOSIS — E7800 Pure hypercholesterolemia, unspecified: Secondary | ICD-10-CM | POA: Diagnosis not present

## 2015-12-19 DIAGNOSIS — Z7982 Long term (current) use of aspirin: Secondary | ICD-10-CM | POA: Diagnosis not present

## 2015-12-19 DIAGNOSIS — I10 Essential (primary) hypertension: Secondary | ICD-10-CM | POA: Diagnosis present

## 2015-12-19 DIAGNOSIS — J449 Chronic obstructive pulmonary disease, unspecified: Secondary | ICD-10-CM | POA: Diagnosis present

## 2015-12-19 DIAGNOSIS — K922 Gastrointestinal hemorrhage, unspecified: Secondary | ICD-10-CM | POA: Diagnosis not present

## 2015-12-19 DIAGNOSIS — Z87891 Personal history of nicotine dependence: Secondary | ICD-10-CM | POA: Insufficient documentation

## 2015-12-19 LAB — URINALYSIS, ROUTINE W REFLEX MICROSCOPIC
Bilirubin Urine: NEGATIVE
Glucose, UA: NEGATIVE mg/dL
Hgb urine dipstick: NEGATIVE
KETONES UR: NEGATIVE mg/dL
LEUKOCYTES UA: NEGATIVE
NITRITE: NEGATIVE
PROTEIN: NEGATIVE mg/dL
Specific Gravity, Urine: <1.005 — ABNORMAL LOW (ref 1.005–1.030)
pH: 7 (ref 5.0–8.0)

## 2015-12-19 LAB — BASIC METABOLIC PANEL
ANION GAP: 7 (ref 5–15)
BUN: 16 mg/dL (ref 6–20)
CALCIUM: 9.4 mg/dL (ref 8.9–10.3)
CO2: 34 mmol/L — ABNORMAL HIGH (ref 22–32)
Chloride: 87 mmol/L — ABNORMAL LOW (ref 101–111)
Creatinine, Ser: 0.99 mg/dL (ref 0.61–1.24)
Glucose, Bld: 130 mg/dL — ABNORMAL HIGH (ref 65–99)
Potassium: 3.1 mmol/L — ABNORMAL LOW (ref 3.5–5.1)
SODIUM: 128 mmol/L — AB (ref 135–145)

## 2015-12-19 LAB — HEPATIC FUNCTION PANEL
ALBUMIN: 4 g/dL (ref 3.5–5.0)
ALT: 18 U/L (ref 17–63)
AST: 22 U/L (ref 15–41)
Alkaline Phosphatase: 65 U/L (ref 38–126)
BILIRUBIN INDIRECT: 0.7 mg/dL (ref 0.3–0.9)
BILIRUBIN TOTAL: 0.8 mg/dL (ref 0.3–1.2)
Bilirubin, Direct: 0.1 mg/dL (ref 0.1–0.5)
TOTAL PROTEIN: 7 g/dL (ref 6.5–8.1)

## 2015-12-19 LAB — CBC WITH DIFFERENTIAL/PLATELET
BASOS ABS: 0 10*3/uL (ref 0.0–0.1)
Basophils Relative: 0 %
Eosinophils Absolute: 0.2 10*3/uL (ref 0.0–0.7)
Eosinophils Relative: 2 %
HEMATOCRIT: 43.3 % (ref 39.0–52.0)
Hemoglobin: 14.7 g/dL (ref 13.0–17.0)
LYMPHS ABS: 1.2 10*3/uL (ref 0.7–4.0)
LYMPHS PCT: 14 %
MCH: 30.8 pg (ref 26.0–34.0)
MCHC: 33.9 g/dL (ref 30.0–36.0)
MCV: 90.8 fL (ref 78.0–100.0)
MONO ABS: 0.9 10*3/uL (ref 0.1–1.0)
Monocytes Relative: 11 %
NEUTROS ABS: 5.9 10*3/uL (ref 1.7–7.7)
Neutrophils Relative %: 73 %
Platelets: 261 10*3/uL (ref 150–400)
RBC: 4.77 MIL/uL (ref 4.22–5.81)
RDW: 12.9 % (ref 11.5–15.5)
WBC: 8.1 10*3/uL (ref 4.0–10.5)

## 2015-12-19 LAB — GLUCOSE, CAPILLARY
GLUCOSE-CAPILLARY: 158 mg/dL — AB (ref 65–99)
Glucose-Capillary: 102 mg/dL — ABNORMAL HIGH (ref 65–99)
Glucose-Capillary: 85 mg/dL (ref 65–99)

## 2015-12-19 LAB — CBC
HCT: 40.9 % (ref 39.0–52.0)
HEMOGLOBIN: 14.2 g/dL (ref 13.0–17.0)
MCH: 31.4 pg (ref 26.0–34.0)
MCHC: 34.7 g/dL (ref 30.0–36.0)
MCV: 90.5 fL (ref 78.0–100.0)
Platelets: 233 10*3/uL (ref 150–400)
RBC: 4.52 MIL/uL (ref 4.22–5.81)
RDW: 12.9 % (ref 11.5–15.5)
WBC: 7.1 10*3/uL (ref 4.0–10.5)

## 2015-12-19 LAB — LIPASE, BLOOD: Lipase: 32 U/L (ref 11–51)

## 2015-12-19 LAB — TYPE AND SCREEN
ABO/RH(D): O POS
ANTIBODY SCREEN: NEGATIVE

## 2015-12-19 LAB — POC OCCULT BLOOD, ED: FECAL OCCULT BLD: POSITIVE — AB

## 2015-12-19 MED ORDER — POTASSIUM GLUCONATE 595 MG PO CAPS: 1.0000 | ORAL_CAPSULE | Freq: Every day | ORAL | Status: DC

## 2015-12-19 MED ORDER — SODIUM CHLORIDE 0.9 % IJ SOLN
INTRAMUSCULAR | Status: AC
  Administered 2015-12-19: 10 mL
  Filled 2015-12-19: qty 1000

## 2015-12-19 MED ORDER — UMECLIDINIUM-VILANTEROL 62.5-25 MCG/INH IN AEPB: 1.0000 | INHALATION_SPRAY | Freq: Every day | RESPIRATORY_TRACT | Status: DC

## 2015-12-19 MED ORDER — ARFORMOTEROL TARTRATE 15 MCG/2ML IN NEBU
15.0000 ug | INHALATION_SOLUTION | Freq: Two times a day (BID) | RESPIRATORY_TRACT | Status: DC
  Administered 2015-12-19 – 2015-12-20 (×2): 15 ug via RESPIRATORY_TRACT
  Filled 2015-12-19 (×2): qty 2

## 2015-12-19 MED ORDER — DEXTROSE-NACL 5-0.9 % IV SOLN
INTRAVENOUS | Status: DC
Start: 2015-12-19 — End: 2015-12-20
  Administered 2015-12-19: 17:00:00 via INTRAVENOUS

## 2015-12-19 MED ORDER — IOHEXOL 300 MG/ML  SOLN
100.0000 mL | Freq: Once | INTRAMUSCULAR | Status: AC | PRN
  Administered 2015-12-19: 100 mL via INTRAVENOUS

## 2015-12-19 MED ORDER — SODIUM CHLORIDE 0.9% FLUSH
3.0000 mL | Freq: Two times a day (BID) | INTRAVENOUS | Status: DC
  Administered 2015-12-19: 3 mL via INTRAVENOUS

## 2015-12-19 MED ORDER — SODIUM CHLORIDE 0.9 % IV BOLUS (SEPSIS)
250.0000 mL | Freq: Once | INTRAVENOUS | Status: AC
  Administered 2015-12-19: 250 mL via INTRAVENOUS

## 2015-12-19 MED ORDER — UMECLIDINIUM BROMIDE 62.5 MCG/INH IN AEPB
1.0000 | INHALATION_SPRAY | Freq: Every day | RESPIRATORY_TRACT | Status: DC
  Administered 2015-12-20: 1 via RESPIRATORY_TRACT
  Filled 2015-12-19: qty 7

## 2015-12-19 MED ORDER — SIMVASTATIN 20 MG PO TABS
20.0000 mg | ORAL_TABLET | Freq: Every day | ORAL | Status: DC
  Administered 2015-12-19: 20 mg via ORAL
  Filled 2015-12-19: qty 1

## 2015-12-19 MED ORDER — DONEPEZIL HCL 5 MG PO TABS
5.0000 mg | ORAL_TABLET | Freq: Every day | ORAL | Status: DC
  Administered 2015-12-19 – 2015-12-20 (×2): 5 mg via ORAL
  Filled 2015-12-19 (×2): qty 1

## 2015-12-19 MED ORDER — ONDANSETRON HCL 4 MG/2ML IJ SOLN
4.0000 mg | Freq: Once | INTRAMUSCULAR | Status: AC
  Administered 2015-12-19: 4 mg via INTRAVENOUS
  Filled 2015-12-19: qty 2

## 2015-12-19 MED ORDER — PANTOPRAZOLE SODIUM 40 MG IV SOLR
40.0000 mg | Freq: Two times a day (BID) | INTRAVENOUS | Status: DC
  Administered 2015-12-19 – 2015-12-20 (×2): 40 mg via INTRAVENOUS
  Filled 2015-12-19 (×2): qty 40

## 2015-12-19 MED ORDER — INSULIN ASPART 100 UNIT/ML ~~LOC~~ SOLN
0.0000 [IU] | SUBCUTANEOUS | Status: DC
  Administered 2015-12-19: 3 [IU] via SUBCUTANEOUS

## 2015-12-19 MED ORDER — SODIUM CHLORIDE 0.9 % IV SOLN
INTRAVENOUS | Status: DC
  Administered 2015-12-19: 15:00:00 via INTRAVENOUS

## 2015-12-19 NOTE — ED Provider Notes (Signed)
CSN: [id number]     Arrival date & time 12/19/15  1029 History   First MD Initiated Contact with Patient 12/19/15 1301     Chief Complaint  Patient presents with  . Rectal Bleeding     (Consider location/radiation/quality/duration/timing/severity/associated sxs/prior Treatment) Patient is a 82 y.o. male presenting with hematochezia. The history is provided by the patient and the spouse.  Rectal Bleeding Associated symptoms: light-headedness   Associated symptoms: no abdominal pain, no fever and no vomiting    patient with a history of some dementia. The patient's wife able to answer most questions. Patient with 2 bowel movements the staining the commode water red. One at 4 in the morning 1 at 6 in the morning. Not associated with any abdominal pain. Patient has however felt lightheaded. No nausea no vomiting. No history of bleeding in the past. Patient is not on blood thinners.  Past Medical History  Diagnosis Date  . COPD (chronic obstructive pulmonary disease) ([place])   . Hypertension   . Dysphagia   . High cholesterol   . Emphysema   . Emphysema of lung ([place])   . GERD (gastroesophageal reflux disease)   . Heart murmur     as a child  . Exertional shortness of breath   . Diabetes mellitus without complication [facility])    Past Surgical History  Procedure Laterality Date  . Retinal detachment surgery Right 08/29/2010  . Esophageal dilation  2000's    x2  . Eye surgery    . Inguinal hernia repair Right 1970's  . Inguinal hernia repair Bilateral 03/12/2013    w/mesh bilaterally/notes 03/12/2013  . Tonsillectomy  1940's  . Transurethral resection of prostate  2002; 2013  . Cataract extraction w/ intraocular lens implant Left 2011  . Inguinal hernia repair Bilateral 03/12/2013    Procedure: HERNIA REPAIR INGUINAL ADULT BILATERAL;  Surgeon: Merrie Roof, MD;  Location: [place];  Service: General;  Laterality: Bilateral;  . Insertion of mesh Bilateral 03/12/2013    Procedure: INSERTION OF  MESH;  Surgeon: Merrie Roof, MD;  Location: [facility];  Service: General;  Laterality: Bilateral;   Family History  Problem Relation Age of Onset  . Stroke Mother   . Healthy Father    Social History  Substance Use Topics  . Smoking status: Former Smoker -- 2.00 packs/day for 35 years    Types: Cigarettes    Quit date: 10/08/1985  . Smokeless tobacco: Former Systems developer    Types: Chew    Quit date: 10/08/1997  . Alcohol Use: No    Review of Systems  Constitutional: Negative for fever.  HENT: Negative for congestion.   Eyes: Negative for redness.  Respiratory: Negative for wheezing.   Cardiovascular: Negative for chest pain.  Gastrointestinal: Positive for blood in stool and hematochezia. Negative for nausea, vomiting, abdominal pain and diarrhea.  Genitourinary: Negative for dysuria.  Musculoskeletal: Negative for back pain.  Skin: Negative for rash.  Neurological: Positive for light-headedness. Negative for headaches.  Hematological: Does not bruise/bleed easily.  Psychiatric/Behavioral: Negative for confusion.      Allergies  Review of patient's allergies indicates no known allergies.  Home Medications   Prior to Admission medications   Medication Sig Start Date End Date Taking? Authorizing Provider  albuterol (PROVENTIL HFA;VENTOLIN HFA) 108 (90 BASE) MCG/ACT inhaler Inhale 2 puffs into the lungs every 6 (six) hours as needed for wheezing or shortness of breath. 09/27/15  Yes Deneise Lever, MD  ANORO ELLIPTA 62.5-25 MCG/INH  AEPB INHALE ONE PUFF BY MOUTH DAILY 11/24/15  Yes Deneise Lever, MD  aspirin EC 325 MG tablet Take 1 tablet (325 mg total) by mouth daily. 08/11/15  Yes Golden Circle, FNP  Cholecalciferol (D3 MAXIMUM STRENGTH) 5000 UNITS capsule Take 5,000 Units by mouth daily.   Yes Historical Provider, MD  donepezil (ARICEPT) 5 MG tablet Take 1/2 tablet daily for 1 week, then increase to 1 tablet daily Patient taking differently: Take 5 mg by mouth daily.   07/26/15  Yes Cameron Sprang, MD  Ginkgo Biloba (GINKOBA PO) Take 120 mg by mouth daily.   Yes Historical Provider, MD  indapamide (LOZOL) 2.5 MG tablet Take 2.5 mg by mouth daily.   Yes Historical Provider, MD  ipratropium-albuterol (DUONEB) 0.5-2.5 (3) MG/3ML SOLN Take 3 mLs by nebulization every 6 (six) hours as needed. 10/12/15  Yes Deneise Lever, MD  metFORMIN (GLUCOPHAGE) 500 MG tablet Take 500 mg by mouth daily. 05/23/15  Yes Historical Provider, MD  nitroGLYCERIN (NITROSTAT) 0.4 MG SL tablet Place 0.4 mg under the tongue every 5 (five) minutes as needed for chest pain.    Yes Historical Provider, MD  polyethylene glycol (MIRALAX / GLYCOLAX) packet Take 17 g by mouth daily.   Yes Historical Provider, MD  Potassium Gluconate 595 MG CAPS Take 1 capsule by mouth daily.   Yes Historical Provider, MD  simvastatin (ZOCOR) 20 MG tablet TAKE ONE TABLET BY MOUTH DAILY AT 6 PM 09/22/15  Yes Golden Circle, FNP  theophylline (UNIPHYL) 400 MG 24 hr tablet Take 200 mg by mouth 3 (three) times daily. 06/08/15  Yes Historical Provider, MD   BP 136/85 mmHg  Pulse 80  Temp(Src) 98.5 F (36.9 C)  Resp 14  Ht 5\' 11"  (1.803 m)  Wt 86.183 kg  BMI 26.51 kg/m2  SpO2 97% Physical Exam  Constitutional: He is oriented to person, place, and time. He appears well-developed and well-nourished. No distress.  HENT:  Head: Normocephalic and atraumatic.  Mouth/Throat: Oropharynx is clear and moist.  Eyes: Conjunctivae and EOM are normal. Pupils are equal, round, and reactive to light.  Neck: Normal range of motion. Neck supple.  Cardiovascular: Normal rate, regular rhythm and normal heart sounds.   No murmur heard. Pulmonary/Chest: Effort normal and breath sounds normal. No respiratory distress.  Abdominal: Soft. Bowel sounds are normal. There is no tenderness.  Genitourinary: Guaiac positive stool.  External skin tags. No palpable mass. Stool is bright red Maroon. Heme positive.  Musculoskeletal: Normal  range of motion. He exhibits no edema.  Neurological: He is alert and oriented to person, place, and time. No cranial nerve deficit. He exhibits normal muscle tone. Coordination normal.  Skin: Skin is warm. No rash noted.  Nursing note and vitals reviewed.   ED Course  Procedures (including critical care time) Labs Review Labs Reviewed  BASIC METABOLIC PANEL - Abnormal; Notable for the following:    Sodium 128 (*)    Potassium 3.1 (*)    Chloride 87 (*)    CO2 34 (*)    Glucose, Bld 130 (*)    All other components within normal limits  CBC WITH DIFFERENTIAL/PLATELET  URINALYSIS, ROUTINE W REFLEX MICROSCOPIC (NOT AT [facility])  LIPASE, BLOOD  HEPATIC FUNCTION PANEL    Imaging Review Ct Abdomen Pelvis W Contrast  12/19/2015  CLINICAL DATA:  Rectal bleeding. EXAM: CT ABDOMEN AND PELVIS WITH CONTRAST TECHNIQUE: Multidetector CT imaging of the abdomen and pelvis was performed using the standard protocol following  bolus administration of intravenous contrast. CONTRAST:  191mL OMNIPAQUE IOHEXOL 300 MG/ML  SOLN COMPARISON:  02/13/2013 FINDINGS: Lower chest: The lung bases are clear of acute process. No worrisome pulmonary lesions. Mild lower lobe peribronchial thickening. The heart is normal in size. No pericardial effusion. Small hiatal hernia. Hepatobiliary: No focal hepatic lesions or intrahepatic biliary dilatation. Numerous small layering gallstones are noted in the gallbladder. No common bile duct dilatation. Pancreas: Moderate pancreatic atrophy but no mass, inflammation or ductal dilatation. Spleen: Normal size.  No focal lesions. Adrenals/Urinary Tract: The adrenal glands are normal. Moderate breathing motion artifact through the kidneys but no obvious abnormalities. Right renal cysts are noted. No renal calculi or hydronephrosis. No obstructing ureteral calculi or bladder calculi. No bladder mass. Stable changes related to a TURP. Stomach/Bowel: The stomach, duodenum, small bowel and colon are  grossly normal. No inflammatory changes, mass lesions or obstructive findings. The terminal ileum is normal. The appendix is normal. Advanced changes of diverticulosis involving the descending and sigmoid colon but no findings for acute diverticulitis. Vascular/Lymphatic: Advanced atherosclerotic calcifications involving the aorta and branch vessels but no focal aneurysm or dissection. The major venous structures are patent. No mesenteric or retroperitoneal mass or adenopathy. Small scattered lymph nodes are stable. Other: No pelvic mass or lymphadenopathy. Surgical changes from a right inguinal hernia repair there is a persistent left inguinal hernia containing a small bowel loop. Musculoskeletal: No significant bony findings. IMPRESSION: 1. No acute abdominal/pelvic findings, mass lesions or lymphadenopathy. 2. No obvious colonic lesion or inflammation. Advanced diverticulosis involving the descending and sigmoid colon. 3. Stable surgical changes from a TURP. 4. Stable advanced atherosclerotic calcifications involving the aorta and branch vessels. 5. Cholelithiasis. 6. Surgical repair of a right inguinal hernia. Stable left inguinal hernia containing a small bowel loop. Electronically Signed   By: Marijo Sanes M.D.   On: 12/19/2015 14:37   I have personally reviewed and evaluated these images and lab results as part of my medical decision-making.   EKG Interpretation None      MDM   Final diagnoses:  Rectal bleeding    Onset of rectal bleeding today. On rectal exam has bright red maroonish stool. Patient's hemoglobin hematocrit is stable. Vital signs stable. CT scan shows large amount of diverticulosis may be the culprit. Patient will require admission and observation. Discussed with the hospitalist.  Patient's electrolyte abnormalities are not clearly explained. Renal function is normal. But the sodium and potassium are on the low side.    Fredia Sorrow, MD 12/19/15 [phone]

## 2015-12-19 NOTE — Telephone Encounter (Signed)
Spoke with Rachel Bo at [facility] and will forward note to Mauricio Po FNP and [place].

## 2015-12-19 NOTE — ED Notes (Signed)
Pt c/o 2 episodes of bright red rectal bleeding this am. denies n/v/d

## 2015-12-19 NOTE — H&P (Signed)
[facility] History and Physical  Luis [name] [id number] DOB: [birth year]    PCP:   Mauricio Po, FNP   Chief Complaint: painless BRBPR.   HPI: Luis [name] is an 82 y.o. male with hx of mild dementia on Aricept, lives at home with his wife, hx of HTN, COPD, HLD, GERD, DM, known significant diverticulosis, with last colonoscopy over 5 years ago, presented to the ER with 2 episodes of painless BRBPR.  He has no black stool, diarrhea, abdominal pain, nausea, vomiting, and has not been on NSAIDS.  Evaluation in the ER included red blood by EDP rectal exam, Hb of 14.7 g per dL, BUN 16, and normal LFTs.  He remained hemodynamically stable, and hospitalist was asked to admit him for lower GI Bleed.  His prior GI doc was in [place].  He offered no other complaints.   Rewiew of Systems:  Constitutional: Negative for malaise, fever and chills. No significant weight loss or weight gain Eyes: Negative for eye pain, redness and discharge, diplopia, visual changes, or flashes of light. ENMT: Negative for ear pain, hoarseness, nasal congestion, sinus pressure and sore throat. No headaches; tinnitus, drooling, or problem swallowing. Cardiovascular: Negative for chest pain, palpitations, diaphoresis, dyspnea and peripheral edema. ; No orthopnea, PND Respiratory: Negative for cough, hemoptysis, wheezing and stridor. No pleuritic chestpain. Gastrointestinal: Negative for nausea, vomiting, diarrhea, constipation, abdominal pain, melena,  hematemesis, jaundice and  Genitourinary: Negative for frequency, dysuria, incontinence,flank pain and hematuria; Musculoskeletal: Negative for back pain and neck pain. Negative for swelling and trauma.;  Skin: . Negative for pruritus, rash, abrasions, bruising and skin lesion.; ulcerations Neuro: Negative for headache, lightheadedness and neck stiffness. Negative for weakness, altered level of consciousness , altered mental status, extremity weakness, burning  feet, involuntary movement, seizure and syncope.  Psych: negative for anxiety, depression, insomnia, tearfulness, panic attacks, hallucinations, paranoia, suicidal or homicidal ideation   Past Medical History  Diagnosis Date  . COPD (chronic obstructive pulmonary disease) ([place])   . Hypertension   . Dysphagia   . High cholesterol   . Emphysema   . Emphysema of lung ([place])   . GERD (gastroesophageal reflux disease)   . Heart murmur     as a child  . Exertional shortness of breath   . Diabetes mellitus without complication [facility])     Past Surgical History  Procedure Laterality Date  . Retinal detachment surgery Right 08/29/2010  . Esophageal dilation  2000's    x2  . Eye surgery    . Inguinal hernia repair Right 1970's  . Inguinal hernia repair Bilateral 03/12/2013    w/mesh bilaterally/notes 03/12/2013  . Tonsillectomy  1940's  . Transurethral resection of prostate  2002; 2013  . Cataract extraction w/ intraocular lens implant Left 2011  . Inguinal hernia repair Bilateral 03/12/2013    Procedure: HERNIA REPAIR INGUINAL ADULT BILATERAL;  Surgeon: Merrie Roof, MD;  Location: [place];  Service: General;  Laterality: Bilateral;  . Insertion of mesh Bilateral 03/12/2013    Procedure: INSERTION OF MESH;  Surgeon: Merrie Roof, MD;  Location: [place];  Service: General;  Laterality: Bilateral;    Medications:  HOME MEDS: Prior to Admission medications   Medication Sig Start Date End Date Taking? Authorizing Provider  albuterol (PROVENTIL HFA;VENTOLIN HFA) 108 (90 BASE) MCG/ACT inhaler Inhale 2 puffs into the lungs every 6 (six) hours as needed for wheezing or shortness of breath. 09/27/15  Yes Deneise Lever, MD  ANORO ELLIPTA 62.5-25  MCG/INH AEPB INHALE ONE PUFF BY MOUTH DAILY 11/24/15  Yes Deneise Lever, MD  aspirin EC 325 MG tablet Take 1 tablet (325 mg total) by mouth daily. 08/11/15  Yes Golden Circle, FNP  Cholecalciferol (D3 MAXIMUM STRENGTH) 5000 UNITS capsule Take 5,000  Units by mouth daily.   Yes Historical Provider, MD  donepezil (ARICEPT) 5 MG tablet Take 1/2 tablet daily for 1 week, then increase to 1 tablet daily Patient taking differently: Take 5 mg by mouth daily.  07/26/15  Yes Cameron Sprang, MD  Ginkgo Biloba (GINKOBA PO) Take 120 mg by mouth daily.   Yes Historical Provider, MD  indapamide (LOZOL) 2.5 MG tablet Take 2.5 mg by mouth daily.   Yes Historical Provider, MD  ipratropium-albuterol (DUONEB) 0.5-2.5 (3) MG/3ML SOLN Take 3 mLs by nebulization every 6 (six) hours as needed. 10/12/15  Yes Deneise Lever, MD  metFORMIN (GLUCOPHAGE) 500 MG tablet Take 500 mg by mouth daily. 05/23/15  Yes Historical Provider, MD  nitroGLYCERIN (NITROSTAT) 0.4 MG SL tablet Place 0.4 mg under the tongue every 5 (five) minutes as needed for chest pain.    Yes Historical Provider, MD  polyethylene glycol (MIRALAX / GLYCOLAX) packet Take 17 g by mouth daily.   Yes Historical Provider, MD  Potassium Gluconate 595 MG CAPS Take 1 capsule by mouth daily.   Yes Historical Provider, MD  simvastatin (ZOCOR) 20 MG tablet TAKE ONE TABLET BY MOUTH DAILY AT 6 PM 09/22/15  Yes Golden Circle, FNP  theophylline (UNIPHYL) 400 MG 24 hr tablet Take 200 mg by mouth 3 (three) times daily. 06/08/15  Yes Historical Provider, MD     Allergies:  No Known Allergies  Social History:   reports that he quit smoking about 30 years ago. His smoking use included Cigarettes. He has a 70 pack-year smoking history. He quit smokeless tobacco use about 18 years ago. His smokeless tobacco use included Chew. He reports that he does not drink alcohol or use illicit drugs.  Family History: Family History  Problem Relation Age of Onset  . Stroke Mother   . Healthy Father      Physical Exam: Filed Vitals:   12/19/15 1040 12/19/15 1306 12/19/15 1451  BP: 96/61 136/85 136/85  Pulse: 79 78 80  Temp: 98.5 F (36.9 C)    Resp: 18 16 14   Height: 5\' 11"  (1.803 m)    Weight: 86.183 kg (190 lb)     SpO2: 96% 98% 97%   Blood pressure 136/85, pulse 80, temperature 98.5 F (36.9 C), resp. rate 14, height 5\' 11"  (1.803 m), weight 86.183 kg (190 lb), SpO2 97 %.  GEN:  Pleasant  patient lying in the stretcher in no acute distress; cooperative with exam. PSYCH:  alert and oriented x4; does not appear anxious or depressed; affect is appropriate. HEENT: Mucous membranes pink and anicteric; PERRLA; EOM intact; no cervical lymphadenopathy nor thyromegaly or carotid bruit; no JVD; There were no stridor. Neck is very supple. Breasts:: Not examined CHEST WALL: No tenderness CHEST: Normal respiration, clear to auscultation bilaterally.  HEART: Regular rate and rhythm.  There are no murmur, rub, or gallops.   BACK: No kyphosis or scoliosis; no CVA tenderness ABDOMEN: soft and non-tender; no masses, no organomegaly, normal abdominal bowel sounds; no pannus; no intertriginous candida. There is no rebound and no distention. Rectal Exam: Not done EXTREMITIES: No bone or joint deformity; age-appropriate arthropathy of the hands and knees; no edema; no ulcerations.  There is  no calf tenderness. Genitalia: not examined PULSES: 2+ and symmetric SKIN: Normal hydration no rash or ulceration CNS: Cranial nerves 2-12 grossly intact no focal lateralizing neurologic deficit.  Speech is fluent; uvula elevated with phonation, facial symmetry and tongue midline. DTR are normal bilaterally, cerebella exam is intact, barbinski is negative and strengths are equaled bilaterally.  No sensory loss.   Labs on Admission:  Basic Metabolic Panel:  Recent Labs Lab 12/19/15 1129  NA 128*  K 3.1*  CL 87*  CO2 34*  GLUCOSE 130*  BUN 16  CREATININE 0.99  CALCIUM 9.4   Liver Function Tests:  Recent Labs Lab 12/19/15 1129  AST 22  ALT 18  ALKPHOS 65  BILITOT 0.8  PROT 7.0  ALBUMIN 4.0    Recent Labs Lab 12/19/15 1129  LIPASE 32   No results for input(s): AMMONIA in the last 168 hours. CBC:  Recent  Labs Lab 12/19/15 1129  WBC 8.1  NEUTROABS 5.9  HGB 14.7  HCT 43.3  MCV 90.8  PLT 261    Radiological Exams on Admission: Ct Abdomen Pelvis W Contrast  12/19/2015  CLINICAL DATA:  Rectal bleeding. EXAM: CT ABDOMEN AND PELVIS WITH CONTRAST TECHNIQUE: Multidetector CT imaging of the abdomen and pelvis was performed using the standard protocol following bolus administration of intravenous contrast. CONTRAST:  189mL OMNIPAQUE IOHEXOL 300 MG/ML  SOLN COMPARISON:  02/13/2013 FINDINGS: Lower chest: The lung bases are clear of acute process. No worrisome pulmonary lesions. Mild lower lobe peribronchial thickening. The heart is normal in size. No pericardial effusion. Small hiatal hernia. Hepatobiliary: No focal hepatic lesions or intrahepatic biliary dilatation. Numerous small layering gallstones are noted in the gallbladder. No common bile duct dilatation. Pancreas: Moderate pancreatic atrophy but no mass, inflammation or ductal dilatation. Spleen: Normal size.  No focal lesions. Adrenals/Urinary Tract: The adrenal glands are normal. Moderate breathing motion artifact through the kidneys but no obvious abnormalities. Right renal cysts are noted. No renal calculi or hydronephrosis. No obstructing ureteral calculi or bladder calculi. No bladder mass. Stable changes related to a TURP. Stomach/Bowel: The stomach, duodenum, small bowel and colon are grossly normal. No inflammatory changes, mass lesions or obstructive findings. The terminal ileum is normal. The appendix is normal. Advanced changes of diverticulosis involving the descending and sigmoid colon but no findings for acute diverticulitis. Vascular/Lymphatic: Advanced atherosclerotic calcifications involving the aorta and branch vessels but no focal aneurysm or dissection. The major venous structures are patent. No mesenteric or retroperitoneal mass or adenopathy. Small scattered lymph nodes are stable. Other: No pelvic mass or lymphadenopathy. Surgical  changes from a right inguinal hernia repair there is a persistent left inguinal hernia containing a small bowel loop. Musculoskeletal: No significant bony findings. IMPRESSION: 1. No acute abdominal/pelvic findings, mass lesions or lymphadenopathy. 2. No obvious colonic lesion or inflammation. Advanced diverticulosis involving the descending and sigmoid colon. 3. Stable surgical changes from a TURP. 4. Stable advanced atherosclerotic calcifications involving the aorta and branch vessels. 5. Cholelithiasis. 6. Surgical repair of a right inguinal hernia. Stable left inguinal hernia containing a small bowel loop. Electronically Signed   By: Marijo Sanes M.D.   On: 12/19/2015 14:37    EKG: Independently reviewed.    Assessment/Plan Present on Admission:  . Essential hypertension . Hyperlipidemia . Mild cognitive impairment . COPD mixed type ([place]) . GI bleed  PLAN:  Lower GI Bleed:  Hemodynamically stable, so we can admit to the floor.  I suspect it is a diverticular bleed, as he has  known extensive diverticulosis, and abdominal pelvic CT today confirmed significant diverticulosis as well.  Will follow H and H every 8 hours.  Type and Screen and transfuse as necessary.  Hold ASA, and give IV PPI Q12 hours.   COPD:  Theophyline has low therapeutic index, so I will d/c it.   HLD: Continue with statin.   Dementia:  Mild, continue low dose Aricept.   Other plans as per orders. Code Status: FULL Haskel Khan, MD. FACP [facility] Pager [phone] 7pm to 7am.  12/19/2015, 3:55 PM

## 2015-12-19 NOTE — Telephone Encounter (Signed)
Agree referral to the ED.

## 2015-12-19 NOTE — Telephone Encounter (Signed)
[place]  Patient Name: ABISHAI SUNDERHAUS  DOB: 1934-06-19    Initial Comment caller states husband passed a lot of blood from rectum   Nurse Assessment  Nurse: Arnetha Courser, RN, Susie Date/Time (Eastern Time): 12/19/2015 9:14:22 AM  Confirm and document reason for call. If symptomatic, describe symptoms. You must click the next button to save text entered. ---caller states husband passed a lot of blood from rectum - occurred this AM; appeared to be several tablespoons, blood was bright red - has not been back; denies weakness, dizziness and breathing difficulty;  Has the patient traveled out of the country within the last 30 days? ---Not Applicable  Does the patient have any new or worsening symptoms? ---Yes  Will a triage be completed? ---Yes  Related visit to physician within the last 2 weeks? ---N/A  Does the PT have any chronic conditions? (i.e. diabetes, asthma, etc.) ---Yes  List chronic conditions. ---COPD  Is this a behavioral health or substance abuse call? ---No     Guidelines    Guideline Title Affirmed Question Affirmed Notes  Rectal Bleeding SEVERE rectal bleeding (large blood clots; on and off, or constant bleeding)    Final Disposition User   Go to ED [facility], RN, Susie    Comments  During call, he went to the bathroom and is continuing to have rectal bleeding - instructed to take to ED for care;   Referrals  GO TO FACILITY OTHER - SPECIFY   Disagree/Comply: Comply

## 2015-12-20 DIAGNOSIS — E0810 Diabetes mellitus due to underlying condition with ketoacidosis without coma: Secondary | ICD-10-CM

## 2015-12-20 DIAGNOSIS — K922 Gastrointestinal hemorrhage, unspecified: Secondary | ICD-10-CM | POA: Diagnosis not present

## 2015-12-20 LAB — CBC
HEMATOCRIT: 36.7 % — AB (ref 39.0–52.0)
HEMATOCRIT: 38.9 % — AB (ref 39.0–52.0)
HEMOGLOBIN: 12.7 g/dL — AB (ref 13.0–17.0)
Hemoglobin: 13.4 g/dL (ref 13.0–17.0)
MCH: 31.3 pg (ref 26.0–34.0)
MCH: 31.2 pg (ref 26.0–34.0)
MCHC: 34.6 g/dL (ref 30.0–36.0)
MCHC: 34.4 g/dL (ref 30.0–36.0)
MCV: 90.4 fL (ref 78.0–100.0)
MCV: 90.7 fL (ref 78.0–100.0)
PLATELETS: 240 10*3/uL (ref 150–400)
Platelets: 216 10*3/uL (ref 150–400)
RBC: 4.06 MIL/uL — AB (ref 4.22–5.81)
RBC: 4.29 MIL/uL (ref 4.22–5.81)
RDW: 13 % (ref 11.5–15.5)
RDW: 13.1 % (ref 11.5–15.5)
WBC: 7.5 10*3/uL (ref 4.0–10.5)
WBC: 6.6 10*3/uL (ref 4.0–10.5)

## 2015-12-20 LAB — GLUCOSE, CAPILLARY
GLUCOSE-CAPILLARY: 120 mg/dL — AB (ref 65–99)
Glucose-Capillary: 110 mg/dL — ABNORMAL HIGH (ref 65–99)
Glucose-Capillary: 111 mg/dL — ABNORMAL HIGH (ref 65–99)

## 2015-12-20 LAB — HEMOGLOBIN A1C
HEMOGLOBIN A1C: 6.5 % — AB (ref 4.8–5.6)
MEAN PLASMA GLUCOSE: 140 mg/dL

## 2015-12-20 NOTE — Telephone Encounter (Signed)
Pt did go to the ED. Called to check on pt. LVM for him to call back if needed.

## 2015-12-20 NOTE — Discharge Instructions (Signed)
Gastrointestinal Bleeding Gastrointestinal (GI) bleeding means there is bleeding somewhere along the digestive tract, between the mouth and anus. CAUSES  There are many different problems that can cause GI bleeding. Possible causes include:  Esophagitis. This is inflammation, irritation, or swelling of the esophagus.  Hemorrhoids.These are veins that are full of blood (engorged) in the rectum. They cause pain, inflammation, and may bleed.  Anal fissures.These are areas of painful tearing which may bleed. They are often caused by passing hard stool.  Diverticulosis.These are pouches that form on the colon over time, with age, and may bleed significantly.  Diverticulitis.This is inflammation in areas with diverticulosis. It can cause pain, fever, and bloody stools, although bleeding is rare.  Polyps and cancer. Colon cancer often starts out as precancerous polyps.  Gastritis and ulcers.Bleeding from the upper gastrointestinal tract (near the stomach) may travel through the intestines and produce black, sometimes tarry, often bad smelling stools. In certain cases, if the bleeding is fast enough, the stools may not be black, but red. This condition may be life-threatening. SYMPTOMS   Vomiting bright red blood or material that looks like coffee grounds.  Bloody, black, or tarry stools. DIAGNOSIS  Your caregiver may diagnose your condition by taking your history and performing a physical exam. More tests may be needed, including:  X-rays and other imaging tests.  Esophagogastroduodenoscopy (EGD). This test uses a flexible, lighted tube to look at your esophagus, stomach, and small intestine.  Colonoscopy. This test uses a flexible, lighted tube to look at your colon. TREATMENT  Treatment depends on the cause of your bleeding.   For bleeding from the esophagus, stomach, small intestine, or colon, the caregiver doing your EGD or colonoscopy may be able to stop the bleeding as part of  the procedure.  Inflammation or infection of the colon can be treated with medicines.  Many rectal problems can be treated with creams, suppositories, or warm baths.  Surgery is sometimes needed.  Blood transfusions are sometimes needed if you have lost a lot of blood. If bleeding is slow, you may be allowed to go home. If there is a lot of bleeding, you will need to stay in the hospital for observation. HOME CARE INSTRUCTIONS   Take any medicines exactly as prescribed.  Keep your stools soft by eating foods that are high in fiber. These foods include whole grains, legumes, fruits, and vegetables. Prunes (1 to 3 a day) work well for many people.  Drink enough fluids to keep your urine clear or pale yellow. SEEK IMMEDIATE MEDICAL CARE IF:   Your bleeding increases.  You feel lightheaded, weak, or you faint.  You have severe cramps in your back or abdomen.  You pass large blood clots in your stool.  Your problems are getting worse. MAKE SURE YOU:   Understand these instructions.  Will watch your condition.  Will get help right away if you are not doing well or get worse.   This information is not intended to replace advice given to you by your health care provider. Make sure you discuss any questions you have with your health care provider.   Document Released: 09/21/2000 Document Revised: 09/10/2012 Document Reviewed: 03/14/2015 Elsevier Interactive Patient Education 2016 [employer]. Diverticulosis Diverticulosis is the condition that develops when small pouches (diverticula) form in the wall of your colon. Your colon, or large intestine, is where water is absorbed and stool is formed. The pouches form when the inside layer of your colon pushes through weak spots in  the outer layers of your colon. CAUSES  No one knows exactly what causes diverticulosis. RISK FACTORS  Being older than 57. Your risk for this condition increases with age. Diverticulosis is rare in people  younger than 40 years. By age 62, almost everyone has it.  Eating a low-fiber diet.  Being frequently constipated.  Being overweight.  Not getting enough exercise.  Smoking.  Taking over-the-counter pain medicines, like aspirin and ibuprofen. SYMPTOMS  Most people with diverticulosis do not have symptoms. DIAGNOSIS  Because diverticulosis often has no symptoms, health care providers often discover the condition during an exam for other colon problems. In many cases, a health care provider will diagnose diverticulosis while using a flexible scope to examine the colon (colonoscopy). TREATMENT  If you have never developed an infection related to diverticulosis, you may not need treatment. If you have had an infection before, treatment may include:  Eating more fruits, vegetables, and grains.  Taking a fiber supplement.  Taking a live bacteria supplement (probiotic).  Taking medicine to relax your colon. HOME CARE INSTRUCTIONS   Drink at least 6-8 glasses of water each day to prevent constipation.  Try not to strain when you have a bowel movement.  Keep all follow-up appointments. If you have had an infection before:  Increase the fiber in your diet as directed by your health care provider or dietitian.  Take a dietary fiber supplement if your health care provider approves.  Only take medicines as directed by your health care provider. SEEK MEDICAL CARE IF:   You have abdominal pain.  You have bloating.  You have cramps.  You have not gone to the bathroom in 3 days. SEEK IMMEDIATE MEDICAL CARE IF:   Your pain gets worse.  Yourbloating becomes very bad.  You have a fever or chills, and your symptoms suddenly get worse.  You begin vomiting.  You have bowel movements that are bloody or black. MAKE SURE YOU:  Understand these instructions.  Will watch your condition.  Will get help right away if you are not doing well or get worse.   This information is  not intended to replace advice given to you by your health care provider. Make sure you discuss any questions you have with your health care provider.   Document Released: 06/21/2004 Document Revised: 09/29/2013 Document Reviewed: 08/19/2013 Elsevier Interactive Patient Education [employer].

## 2015-12-20 NOTE — Care Management Note (Signed)
Case Management Note  Patient Details  Name: Luis [name] MRN: [id number] Date of Birth: 1934-07-16  Subjective/Objective:        Spoke with patient and spouse from discharge plan patient is alert oriented and independent from home with spouse, No CM needs identified.           Action/Plan:  Home with self care. Expected Discharge Date:                  Expected Discharge Plan:  Home/Self Care  In-House Referral:     Discharge planning Services  CM Consult  Post Acute Care Choice:    Choice offered to:     DME Arranged:    DME Agency:     HH Arranged:    [place] Agency:     Status of Service:  Completed, signed off  Medicare Important Message Given:    Date Medicare IM Given:    Medicare IM give by:    Date Additional Medicare IM Given:    Additional Medicare Important Message give by:     If discussed at [place] of Stay Meetings, dates discussed:    Additional Comments:  Alvie Heidelberg, RN 12/20/2015, 12:39 PM

## 2015-12-20 NOTE — Discharge Summary (Signed)
Physician Discharge Summary  Luis [name] [id number] DOB: 01-21-1934 DOA: 12/19/2015  PCP: Luis Po, FNP  Admit date: 12/19/2015 Discharge date: 12/20/2015  Time spent: 45 minutes  Recommendations for Outpatient Follow-up:  -Will be discharged home today. -Advised to follow up with PCP in 2 weeks for Hb reheck.   Discharge Diagnoses:  Principal Problem:   Lower GI bleed Active Problems:   COPD mixed type ([place])   Essential hypertension   Diabetes mellitus ([place])   Mild cognitive impairment   Hyperlipidemia   GI bleed   Discharge Condition: Stable and improved  Filed Weights   12/19/15 1040 12/19/15 1706  Weight: 86.183 kg (190 lb) 80.151 kg (176 lb 11.2 oz)    History of present illness:  As per Dr. Marin [name] on 3/13: Luis [name] is an 82 y.o. male with hx of mild dementia on Aricept, lives at home with his wife, hx of HTN, COPD, HLD, GERD, DM, known significant diverticulosis, with last colonoscopy over 5 years ago, presented to the ER with 2 episodes of painless BRBPR. He has no black stool, diarrhea, abdominal pain, nausea, vomiting, and has not been on NSAIDS. Evaluation in the ER included red blood by EDP rectal exam, Hb of 14.7 g per dL, BUN 16, and normal LFTs. He remained hemodynamically stable, and hospitalist was asked to admit him for lower GI Bleed. His prior GI doc was in [place]. He offered no other complaints.   Hospital Course:   Hematochezia -Has resolved. -Hb has remained stable around the 13-14 range without transfusions. -Likely represents hemorrhoidal vs diverticular; self-limiting  COPD -Not exacerbated.  Hyperlipidemia -Continue statisn.  Dementia -Mild. -Continue aricept.  Procedures:  None   Consultations:  None  Discharge Instructions  Discharge Instructions    Diet - low sodium heart healthy    Complete by:  As directed      Increase activity slowly    Complete by:  As directed             Medication List      TAKE these medications        albuterol 108 (90 Base) MCG/ACT inhaler  Commonly known as:  PROVENTIL HFA;VENTOLIN HFA  Inhale 2 puffs into the lungs every 6 (six) hours as needed for wheezing or shortness of breath.     ANORO ELLIPTA 62.5-25 MCG/INH Aepb  Generic drug:  umeclidinium-vilanterol  INHALE ONE PUFF BY MOUTH DAILY     aspirin EC 325 MG tablet  Take 1 tablet (325 mg total) by mouth daily.     D3 MAXIMUM STRENGTH 5000 units capsule  Generic drug:  Cholecalciferol  Take 5,000 Units by mouth daily.     donepezil 5 MG tablet  Commonly known as:  ARICEPT  Take 1/2 tablet daily for 1 week, then increase to 1 tablet daily     GINKOBA [name]  Take 120 mg by mouth daily.     indapamide 2.5 MG tablet  Commonly known as:  LOZOL  Take 2.5 mg by mouth daily.     ipratropium-albuterol 0.5-2.5 (3) MG/3ML Soln  Commonly known as:  DUONEB  Take 3 mLs by nebulization every 6 (six) hours as needed.     metFORMIN 500 MG tablet  Commonly known as:  GLUCOPHAGE  Take 500 mg by mouth daily.     nitroGLYCERIN 0.4 MG SL tablet  Commonly known as:  NITROSTAT  Place 0.4 mg under the tongue every 5 (five) minutes as needed for chest  pain.     polyethylene glycol packet  Commonly known as:  MIRALAX / GLYCOLAX  Take 17 g by mouth daily.     Potassium Gluconate 595 MG Caps  Take 1 capsule by mouth daily.     simvastatin 20 MG tablet  Commonly known as:  ZOCOR  TAKE ONE TABLET BY MOUTH DAILY AT 6 PM     theophylline 400 MG 24 hr tablet  Commonly known as:  UNIPHYL  Take 200 mg by mouth 3 (three) times daily.       No Known Allergies     Follow-up Information    Follow up with Luis Po, FNP. Schedule an appointment as soon as possible for a visit in 2 weeks.   Specialty:  Family Medicine   Contact information:   [facility] [place] [address] [phone]        The results of significant diagnostics from this hospitalization (including imaging,  microbiology, ancillary and laboratory) are listed below for reference.    Significant Diagnostic Studies: Ct Abdomen Pelvis W Contrast  12/19/2015  CLINICAL DATA:  Rectal bleeding. EXAM: CT ABDOMEN AND PELVIS WITH CONTRAST TECHNIQUE: Multidetector CT imaging of the abdomen and pelvis was performed using the standard protocol following bolus administration of intravenous contrast. CONTRAST:  179mL OMNIPAQUE IOHEXOL 300 MG/ML  SOLN COMPARISON:  02/13/2013 FINDINGS: Lower chest: The lung bases are clear of acute process. No worrisome pulmonary lesions. Mild lower lobe peribronchial thickening. The heart is normal in size. No pericardial effusion. Small hiatal hernia. Hepatobiliary: No focal hepatic lesions or intrahepatic biliary dilatation. Numerous small layering gallstones are noted in the gallbladder. No common bile duct dilatation. Pancreas: Moderate pancreatic atrophy but no mass, inflammation or ductal dilatation. Spleen: Normal size.  No focal lesions. Adrenals/Urinary Tract: The adrenal glands are normal. Moderate breathing motion artifact through the kidneys but no obvious abnormalities. Right renal cysts are noted. No renal calculi or hydronephrosis. No obstructing ureteral calculi or bladder calculi. No bladder mass. Stable changes related to a TURP. Stomach/Bowel: The stomach, duodenum, small bowel and colon are grossly normal. No inflammatory changes, mass lesions or obstructive findings. The terminal ileum is normal. The appendix is normal. Advanced changes of diverticulosis involving the descending and sigmoid colon but no findings for acute diverticulitis. Vascular/Lymphatic: Advanced atherosclerotic calcifications involving the aorta and branch vessels but no focal aneurysm or dissection. The major venous structures are patent. No mesenteric or retroperitoneal mass or adenopathy. Small scattered lymph nodes are stable. Other: No pelvic mass or lymphadenopathy. Surgical changes from a right  inguinal hernia repair there is a persistent left inguinal hernia containing a small bowel loop. Musculoskeletal: No significant bony findings. IMPRESSION: 1. No acute abdominal/pelvic findings, mass lesions or lymphadenopathy. 2. No obvious colonic lesion or inflammation. Advanced diverticulosis involving the descending and sigmoid colon. 3. Stable surgical changes from a TURP. 4. Stable advanced atherosclerotic calcifications involving the aorta and branch vessels. 5. Cholelithiasis. 6. Surgical repair of a right inguinal hernia. Stable left inguinal hernia containing a small bowel loop. Electronically Signed   By: Marijo Sanes M.D.   On: 12/19/2015 14:37    Microbiology: No results found for this or any previous visit (from the past 240 hour(s)).   Labs: Basic Metabolic Panel:  Recent Labs Lab 12/19/15 1129  NA 128*  K 3.1*  CL 87*  CO2 34*  GLUCOSE 130*  BUN 16  CREATININE 0.99  CALCIUM 9.4   Liver Function Tests:  Recent Labs Lab 12/19/15  1129  AST 22  ALT 18  ALKPHOS 65  BILITOT 0.8  PROT 7.0  ALBUMIN 4.0    Recent Labs Lab 12/19/15 1129  LIPASE 32   No results for input(s): AMMONIA in the last 168 hours. CBC:  Recent Labs Lab 12/19/15 1129 12/19/15 1718 12/20/15 0102 12/20/15 0902  WBC 8.1 7.1 7.5 6.6  NEUTROABS 5.9  --   --   --   HGB 14.7 14.2 12.7* 13.4  HCT 43.3 40.9 36.7* 38.9*  MCV 90.8 90.5 90.4 90.7  PLT 261 233 216 240   Cardiac Enzymes: No results for input(s): CKTOTAL, CKMB, CKMBINDEX, TROPONINI in the last 168 hours. BNP: BNP (last 3 results)  Recent Labs  07/15/15 1948  BNP 24.0    ProBNP (last 3 results) No results for input(s): PROBNP in the last 8760 hours.  CBG:  Recent Labs Lab 12/19/15 2002 12/19/15 2339 12/20/15 0336 12/20/15 0713 12/20/15 1127  GLUCAP 102* 85 110* 111* 120*       Signed:  HERNANDEZ ACOSTA,ESTELA  [facility] Pager: [phone] 12/20/2015, 2:51 PM

## 2015-12-21 ENCOUNTER — Other Ambulatory Visit: Payer: Self-pay | Admitting: Family

## 2015-12-22 ENCOUNTER — Telehealth: Payer: Self-pay | Admitting: *Deleted

## 2015-12-22 NOTE — Telephone Encounter (Signed)
Transition Care Management Follow-up Telephone Call  Date discharged? 12/20/14   How have you been since you were released from the hospital? Spoke with pt wife she states he is doing alright   Do you understand why you were in the hospital? YES   Do you understand the discharge instructions? YES   Where were you discharged to? Home   Items Reviewed:  Medications reviewed: YES  Allergies reviewed: YES  Dietary changes reviewed: NO  Referrals reviewed: No referral needed   Functional Questionnaire:   Activities of Daily Living (ADLs):   She states he are independent in the following: ambulation, bathing and hygiene, feeding, continence, grooming, toileting and dressing States he doesn't require assistance    Any transportation issues/concerns?: NO   Any patient concerns? YES   Confirmed importance and date/time of follow-up visits scheduled YES. Made 12/29/15  Provider Appointment booked with Terri Piedra, NP  Confirmed with patient if condition begins to worsen call PCP or go to the ER.  Patient was given the office number and encouraged to call back with question or concerns.  : YES

## 2015-12-28 ENCOUNTER — Encounter: Payer: Self-pay | Admitting: Neurology

## 2015-12-28 ENCOUNTER — Ambulatory Visit (INDEPENDENT_AMBULATORY_CARE_PROVIDER_SITE_OTHER): Payer: Medicare Other | Admitting: Neurology

## 2015-12-28 VITALS — BP 118/68 | HR 59 | Ht 71.0 in | Wt 182.0 lb

## 2015-12-28 DIAGNOSIS — G3184 Mild cognitive impairment, so stated: Secondary | ICD-10-CM | POA: Diagnosis not present

## 2015-12-28 DIAGNOSIS — Z8673 Personal history of transient ischemic attack (TIA), and cerebral infarction without residual deficits: Secondary | ICD-10-CM

## 2015-12-28 DIAGNOSIS — E119 Type 2 diabetes mellitus without complications: Secondary | ICD-10-CM

## 2015-12-28 DIAGNOSIS — E785 Hyperlipidemia, unspecified: Secondary | ICD-10-CM

## 2015-12-28 DIAGNOSIS — I10 Essential (primary) hypertension: Secondary | ICD-10-CM

## 2015-12-28 MED ORDER — DONEPEZIL HCL 10 MG PO TABS: ORAL_TABLET | ORAL | Status: DC

## 2015-12-28 NOTE — Patient Instructions (Signed)
1. With your current bottle, increase Aricept 5mg : Take 2 tablets daily. After you finish your bottle, your new prescription will be for Aricept 10mg : Take 1 tablet daily 2. Continue aspirin, control of BP, cholesterol. Physical exercise and brain stimulation exercises are important for brain health 3. Continue to monitor driving 4. Follow-up in 6 months

## 2015-12-28 NOTE — Progress Notes (Signed)
NEUROLOGY FOLLOW UP OFFICE NOTE  SHEPHEN LASHLEE AT:[id number]  HISTORY OF PRESENT ILLNESS: I had the pleasure of seeing Luis [name] in follow-up in the neurology clinic on 12/28/2015.  The patient was last seen 7 months ago after a small stroke and memory changes. He is again accompanied by his wife and son who help supplement the history today. MMSE in October 2016 was 25/30, he was started on Aricept 5mg  daily. His wife is concerned that he is worsening. The patient feels his memory is unchanged. He drives minimally, usually only short distances in town, but sometimes wonders where he is headed. He was able to get all the documents ready for their taxes and denies any missed bill payments, but family reports they could not find his check registry. He denies any missed medications, his wife ensures he takes them. His wife and son mostly report short-term memory changes, he would look at an appointment on his calendar then an hour later ask what it was for. He misplaces things frequently. His son reported an incident when he "mistransposed" his house number for 351 instead of 371. No personality changes, hallucinations, difficulties with ADLs. He denies any headaches, dizziness, diplopia, dysarthria, dysphagia, neck/back pain, focal numbness/tingling/weakness, bowel/bladder dysfunction.  HPI 07/26/15: This is a very pleasant 82 yo RH man with a history of hypertension, hyperlipidemia, diabetes, COPD, who presented for evaluation of worsening memory and hospital follow-up after recent stroke in October 2016. He feels his memory "could be better," he sometimes has to think when asked a question. His family reports memory changes started over the past year, he would forget names, conversations, ask the same questions. He would misplace things. His wife has recently started to pay attention to make sure he takes his medications, but he denies missing any doses. He continues to balance their checkbook and  denies any missed bill payments. Prior to the stroke, he was driving and denied getting lost, but family has noticed his wife was doing more of the driving. He has occasional word-finding difficulties. He denies any difficulties with ADLs. On 07/15/15, they were at a picnic when family noticed that something was "not right," he had rambling speech and was not making sense, starting off a sentence then drifting off. There was no slurred speech or focal weakness noted. He was brought to [facility] where MRI brain showed a small acute infarct in the left external capsule. I personally reviewed MRI brain, in addition there was moderate chronic microvascular disease. Carotid dopplers showed less than 50% stenosis bilaterally, mild plaque at the level of the carotid bulbs and ICA as well as the right common carotid artery. Echocardiogram showed EF 65-70%, normal left atrium. He had been taking low dose aspirin, and was switched to full dose 325mg  aspirin. Lipid panel showed total cholesterol 164, LDL 88. Simvastatin dose increased to 20mg  daily. HbA1c 6.8. He was also diagnosed with hyponatremia with sodium of 128 and hypoxia from COPD exacerbation. On hospital discharge, sodium level was 132. He was back to cognitive baseline. He denies any significant head injuries. No family history of dementia. He has always had a poor sense of smell.   PAST MEDICAL HISTORY: Past Medical History  Diagnosis Date  . COPD (chronic obstructive pulmonary disease) ([place])   . Hypertension   . Dysphagia   . High cholesterol   . Emphysema   . Emphysema of lung ([place])   . GERD (gastroesophageal reflux disease)   . Heart murmur  as a child  . Exertional shortness of breath   . Diabetes mellitus without complication [facility])     MEDICATIONS: Current Outpatient Prescriptions on File Prior to Visit  Medication Sig Dispense Refill  . albuterol (PROVENTIL HFA;VENTOLIN HFA) 108 (90 BASE) MCG/ACT inhaler Inhale 2 puffs into the lungs  every 6 (six) hours as needed for wheezing or shortness of breath. 1 Inhaler 11  . ANORO ELLIPTA 62.5-25 MCG/INH AEPB INHALE ONE PUFF BY MOUTH DAILY 60 each 2  . aspirin EC 325 MG tablet Take 1 tablet (325 mg total) by mouth daily. 90 tablet 3  . Cholecalciferol (D3 MAXIMUM STRENGTH) 5000 UNITS capsule Take 5,000 Units by mouth daily.    [name] donepezil (ARICEPT) 5 MG tablet Take 1/2 tablet daily for 1 week, then increase to 1 tablet daily (Patient taking differently: Take 5 mg by mouth daily. ) 30 tablet 11  . Ginkgo Biloba (GINKOBA PO) Take 120 mg by mouth daily.    . indapamide (LOZOL) 2.5 MG tablet Take 2.5 mg by mouth daily.    [name] ipratropium-albuterol (DUONEB) 0.5-2.5 (3) MG/3ML SOLN Take 3 mLs by nebulization every 6 (six) hours as needed. 75 mL 2  . metFORMIN (GLUCOPHAGE) 500 MG tablet Take 500 mg by mouth daily.  2  . nitroGLYCERIN (NITROSTAT) 0.4 MG SL tablet Place 0.4 mg under the tongue every 5 (five) minutes as needed for chest pain.     . polyethylene glycol (MIRALAX / GLYCOLAX) packet Take 17 g by mouth daily.    . Potassium Gluconate 595 MG CAPS Take 1 capsule by mouth daily.    . simvastatin (ZOCOR) 20 MG tablet TAKE ONE TABLET BY MOUTH DAILY AT 6 PM 90 tablet 1  . theophylline (UNIPHYL) 400 MG 24 hr tablet Take 200 mg by mouth 3 (three) times daily.  11   No current facility-administered medications on file prior to visit.    ALLERGIES: No Known Allergies  FAMILY HISTORY: Family History  Problem Relation Age of Onset  . Stroke Mother   . Healthy Father     SOCIAL HISTORY: Social History   Social History  . Marital Status: Married    Spouse Name: N/A  . Number of Children: 3  . Years of Education: 12   Occupational History  . Retired    Social History Main Topics  . Smoking status: Former Smoker -- 2.00 packs/day for 35 years    Types: Cigarettes    Quit date: 10/08/1985  . Smokeless tobacco: Former Systems developer    Types: Chew    Quit date: 10/08/1997  . Alcohol  Use: No  . Drug Use: No  . Sexual Activity: Not Currently   Other Topics Concern  . Not on file   Social History Narrative   Fun: Golf   Denies abuse and feels safe at home.     REVIEW OF SYSTEMS: Constitutional: No fevers, chills, or sweats, no generalized fatigue, change in appetite Eyes: No visual changes, double vision, eye pain Ear, nose and throat: No hearing loss, ear pain, nasal congestion, sore throat Cardiovascular: No chest pain, palpitations Respiratory:  No shortness of breath at rest or with exertion, wheezes GastrointestinaI: No nausea, vomiting, diarrhea, abdominal pain, fecal incontinence Genitourinary:  No dysuria, urinary retention or frequency Musculoskeletal:  No neck pain, back pain Integumentary: No rash, pruritus, skin lesions Neurological: as above Psychiatric: No depression, insomnia, anxiety Endocrine: No palpitations, fatigue, diaphoresis, mood swings, change in appetite, change in weight, increased thirst Hematologic/Lymphatic:  No  anemia, purpura, petechiae. Allergic/Immunologic: no itchy/runny eyes, nasal congestion, recent allergic reactions, rashes  PHYSICAL EXAM: Filed Vitals:   12/28/15 1542  BP: 118/68  Pulse: 59   General: No acute distress Head:  Normocephalic/atraumatic Neck: supple, no paraspinal tenderness, full range of motion Heart:  Regular rate and rhythm Lungs:  Clear to auscultation bilaterally Back: No paraspinal tenderness Skin/Extremities: No rash, no edema Neurological Exam: alert and oriented to person, place, and time. No aphasia or dysarthria. Fund of knowledge is appropriate.  Recent and remote memory are intact.  Attention and concentration are normal.    Able to name objects and repeat phrases. CDT 5/5 MMSE - Mini Mental State Exam 12/28/2015 07/26/2015  Orientation to time 5 4  Orientation to Place 5 4  Registration 3 3  Attention/ Calculation 5 4  Recall 0 1  Language- name 2 objects 2 2  Language- repeat 1 1    Language- follow 3 step command 3 3  Language- read & follow direction 1 1  Write a sentence 1 1  Copy design 1 1  Total score 27 25   Cranial nerves: Pupils equal, round, reactive to light.  Extraocular movements intact with no nystagmus. Visual fields full. Facial sensation intact. No facial asymmetry. Tongue, uvula, palate midline.  Motor: Bulk and tone normal, muscle strength 5/5 throughout with no pronator drift.  Sensation to light touch intact.  No extinction to double simultaneous stimulation.  Deep tendon reflexes +1 throughout, toes downgoing.  Finger to nose testing intact.  Gait narrow-based and steady, able to tandem walk adequately.  Romberg negative.  IMPRESSION: This is a pleasant 82 yo RH man with a history of hypertension, hyperlipidemia, diabetes, COPD, with worsening memory and small left subcortical stroke last 07/15/15. Stroke appears to be due to small vessel disease, stroke workup unremarkable. He is now on full dose aspirin and higher dose statin. His family is concerned about continued worsening of short-term memory, MMSE today 27/30 (previously 25/30 in October 2016), indicating mild cognitive impairment. He is on Aricept 5mg /day and will increase to 10mg  daily. We discussed his diagnosis, expectations, as well as expectations from the medications. We again discussed control of vascular risk factors and daily aspirin, as well as physical exercise and brain stimulation exercises for brain health. Continue to monitor driving. He will follow-up in 6 months and knows to call our office for any problems.   Thank you for allowing me to participate in his care.  Please do not hesitate to call for any questions or concerns.  The duration of this appointment visit was 25 minutes of face-to-face time with the patient.  Greater than 50% of this time was spent in counseling, explanation of diagnosis, planning of further management, and coordination of care.   Ellouise Newer,  M.D.   CC: Mauricio Po, FNP

## 2015-12-28 NOTE — H&P (Signed)
  NTS SOAP Note  Vital Signs:  Vitals as of: [id number]: Systolic 99991111: Diastolic 63: Heart Rate 63: Temp 98.19F (Temporal): Height 70ft 11in: Weight 183Lbs 0 Ounces: BMI 25.52   BMI : 25.52 kg/m2  Subjective: This 82 year old male presents for of blood per rectum. He was recently seen in the emergency room and admitted to the hospital for hematochezia. He did not require blood transfusion. While the hospital, it was felt that this was a diverticular bleed based on CAT scan of the abdomen. He is now referred for colonoscopy. He has not had a colonoscopy admit years. The bleeding has subsided. His bowel movements are normal. He denies any weight loss. He denies any family history of colon cancer.  Review of Symptoms:  Constitutional:unremarkable   Head:unremarkable Eyes:unremarkable   Nose/Mouth/Throat:unremarkable Cardiovascular:  unremarkable Respiratory:dyspnea, wheezing, cough Gastrointestinal:  unremarkable   Genitourinary:frequency Musculoskeletal:unremarkable Skin:unremarkable Hematolgic/Lymphatic:unremarkable   Allergic/Immunologic:unremarkable   Past Medical History:  Reviewed  Past Medical History  Surgical History: eye surgery, inguinal herniorrhaphy bilateral Medical Problems: hypertension, high cholesterol levels, emphysema, GERD, diabetes mellitus Allergies: nkda Medications: Proventil inhaler, aspirin, Anoro, Aricept, Lozol, DuoNeb, metformin, nitroglycerin when necessary, Zocor, potassium supplement, theophylline   Social History:Reviewed  Social History  Preferred Language: English Race:  White Ethnicity: Not Hispanic / Latino Age: 82 year Marital Status:  M Alcohol: socially   Smoking Status: Current every day smoker reviewed on 12/27/2015 Started Date:  Packs per week:  Functional Status reviewed on 12/27/2015 ------------------------------------------------ Bathing: Normal Cooking: Normal Dressing: Normal Driving:  Normal Eating: Normal Managing Meds: Normal Oral Care: Normal Shopping: Normal Toileting: Normal Transferring: Normal Walking: Normal Cognitive Status reviewed on 12/27/2015 ------------------------------------------------ Attention: Normal Decision Making: Normal Language: Normal Memory: Normal Motor: Normal Perception: Normal Problem Solving: Normal Visual and Spatial: Normal   Family History:Reviewed  Family Health History Mother, Deceased; Stroke (CVA);  Father     Objective Information: General:Well appearing, well nourished in no distress. Neck:Supple without lymphadenopathy.  Heart:RRR, no murmur Lungs:  CTA bilaterally, no wheezes, rhonchi, rales.  Breathing unlabored. Abdomen:Soft, NT/ND, no HSM, no masses. deferred to the procedure  Assessment:blood per rectum  Diagnoses: 569.3  K92.1 Hematochezia ([place])  Procedures: [id number] - OFFICE OUTPATIENT NEW 20 MINUTES    Plan:scheduled for colonoscopy on 01/03/2016. TriLyte has been ordered.   Patient Education:Alternative treatments to surgery were discussed with patient (and family).  Risks and benefits  of procedure including bleeding and perforation were fully explained to the patient (and family) who gave informed consent. Patient/family questions were addressed.  Follow-up:Pending Surgery

## 2015-12-29 ENCOUNTER — Other Ambulatory Visit: Payer: Medicare Other

## 2015-12-29 ENCOUNTER — Ambulatory Visit (INDEPENDENT_AMBULATORY_CARE_PROVIDER_SITE_OTHER): Payer: Medicare Other | Admitting: Family

## 2015-12-29 ENCOUNTER — Encounter: Payer: Self-pay | Admitting: Family

## 2015-12-29 ENCOUNTER — Other Ambulatory Visit (INDEPENDENT_AMBULATORY_CARE_PROVIDER_SITE_OTHER): Payer: Medicare Other

## 2015-12-29 ENCOUNTER — Encounter: Payer: Self-pay | Admitting: Neurology

## 2015-12-29 VITALS — BP 104/68 | HR 64 | Temp 97.8°F | Resp 16 | Ht 71.0 in | Wt 182.0 lb

## 2015-12-29 DIAGNOSIS — K922 Gastrointestinal hemorrhage, unspecified: Secondary | ICD-10-CM

## 2015-12-29 DIAGNOSIS — Z8673 Personal history of transient ischemic attack (TIA), and cerebral infarction without residual deficits: Secondary | ICD-10-CM | POA: Insufficient documentation

## 2015-12-29 LAB — CBC
HEMATOCRIT: 40.9 % (ref 39.0–52.0)
HEMOGLOBIN: 13.7 g/dL (ref 13.0–17.0)
MCHC: 33.5 g/dL (ref 30.0–36.0)
MCV: 91 fl (ref 78.0–100.0)
Platelets: 284 10*3/uL (ref 150.0–400.0)
RBC: 4.5 Mil/uL (ref 4.22–5.81)
RDW: 13.8 % (ref 11.5–15.5)
WBC: 9.6 10*3/uL (ref 4.0–10.5)

## 2015-12-29 NOTE — Patient Instructions (Addendum)
Thank you for choosing [facility].  Summary/Instructions:  Please continue to take your medications as prescribed.   Follow up for your colonoscopy.   Please follow up for your wellness exam and physical.   If your symptoms worsen or fail to improve, please contact our office for further instruction, or in case of emergency go directly to the emergency room at the closest medical facility.

## 2015-12-29 NOTE — Assessment & Plan Note (Addendum)
This is a new problem. Symptoms of lower GI bleed resolved with no additional occurrences since hospitalization. Abdominal exam is benign. Scheduled for colonoscopy in one week. Obtain CBC to check hemoglobin status. Follow-up if symptoms return or worsen.

## 2015-12-29 NOTE — Progress Notes (Signed)
Subjective:    Patient ID: Luis [name], male    DOB: Dec 12, 1933, 82 y.o.   MRN: [id number]  Chief Complaint  Patient presents with  . Hospitalization Follow-up    was in the hospital for blood in stool, no more blood in stool right now getting a colonoscopy    HPI:  [name] is a 82 y.o. male who  has a past medical history of COPD (chronic obstructive pulmonary disease) ([place]); Hypertension; Dysphagia; High cholesterol; Emphysema; Emphysema of lung ([place]); GERD (gastroesophageal reflux disease); Heart murmur; Exertional shortness of breath; and Diabetes mellitus without complication ([name]). and presents today for a hospitalization follow up.   Recently evaluated in the emergency department and admitted to the hospital for hematochezia with lightheadedness. Guaiac stool test was positive. Vital signs are stable along with hemoglobin and hematocrit. CT scan showed a large amount of diverticulosis. He was admitted for observation. He was noted to have low sodium and potassium. He remained hemodynamically stable with hematochezia resolving likely representing hemorrhoidal or diverticular bleeding. This was noted to be self-limiting. He was advised to follow-up with primary care. All hospital records, labs, and imaging reviewed in detail.  Since leaving the hospital he has not had any blood in his stool. He is having regular bowel movements. No fevers, constipation, diarreha, nausea, vomiting. He has a colonoscopy scheduled in 1 week. Reports eating well with no difficulties. Continues to take medications as prescribed. Has returned to baseline function.  No Known Allergies   Current Outpatient Prescriptions on File Prior to Visit  Medication Sig Dispense Refill  . albuterol (PROVENTIL HFA;VENTOLIN HFA) 108 (90 BASE) MCG/ACT inhaler Inhale 2 puffs into the lungs every 6 (six) hours as needed for wheezing or shortness of breath. 1 Inhaler 11  . ANORO ELLIPTA 62.5-25 MCG/INH AEPB INHALE  ONE PUFF BY MOUTH DAILY 60 each 2  . aspirin EC 325 MG tablet Take 1 tablet (325 mg total) by mouth daily. 90 tablet 3  . Cholecalciferol (D3 MAXIMUM STRENGTH) 5000 UNITS capsule Take 5,000 Units by mouth daily.    [name] donepezil (ARICEPT) 10 MG tablet Take 1 tablet daily 90 tablet 3  . indapamide (LOZOL) 2.5 MG tablet Take 2.5 mg by mouth daily.    . metFORMIN (GLUCOPHAGE) 500 MG tablet Take 500 mg by mouth daily.  2  . nitroGLYCERIN (NITROSTAT) 0.4 MG SL tablet Place 0.4 mg under the tongue every 5 (five) minutes as needed for chest pain. Reported on 12/28/2015    . OXYGEN Inhale into the lungs. On 2 liters at night    . polyethylene glycol (MIRALAX / GLYCOLAX) packet Take 17 g by mouth daily.    . Potassium Gluconate 595 MG CAPS Take 1 capsule by mouth daily.    . simvastatin (ZOCOR) 20 MG tablet TAKE ONE TABLET BY MOUTH DAILY AT 6 PM 90 tablet 1  . theophylline (UNIPHYL) 400 MG 24 hr tablet Take 200 mg by mouth 3 (three) times daily.  11   No current facility-administered medications on file prior to visit.    Past Surgical History  Procedure Laterality Date  . Retinal detachment surgery Right 08/29/2010  . Esophageal dilation  2000's    x2  . Eye surgery    . Inguinal hernia repair Right 1970's  . Inguinal hernia repair Bilateral 03/12/2013    w/mesh bilaterally/notes 03/12/2013  . Tonsillectomy  1940's  . Transurethral resection of prostate  2002; 2013  . Cataract extraction w/ intraocular lens implant  Left 2011  . Inguinal hernia repair Bilateral 03/12/2013    Procedure: HERNIA REPAIR INGUINAL ADULT BILATERAL;  Surgeon: Merrie Roof, MD;  Location: [facility];  Service: General;  Laterality: Bilateral;  . Insertion of mesh Bilateral 03/12/2013    Procedure: INSERTION OF MESH;  Surgeon: Merrie Roof, MD;  Location: [facility];  Service: General;  Laterality: Bilateral;    Past Medical History  Diagnosis Date  . COPD (chronic obstructive pulmonary disease) ([place])   . Hypertension   .  Dysphagia   . High cholesterol   . Emphysema   . Emphysema of lung ([place])   . GERD (gastroesophageal reflux disease)   . Heart murmur     as a child  . Exertional shortness of breath   . Diabetes mellitus without complication ([place])     Review of Systems  Constitutional: Negative for fever and chills.  Cardiovascular: Negative for chest pain and palpitations.  Gastrointestinal: Negative for nausea, vomiting, abdominal pain, diarrhea, constipation and blood in stool.  Neurological: Negative for weakness and light-headedness.      Objective:    BP 104/68 mmHg  Pulse 64  Temp(Src) 97.8 F (36.6 C) (Oral)  Resp 16  Ht 5\' 11"  (1.803 m)  Wt 182 lb (82.555 kg)  BMI 25.40 kg/m2  SpO2 97% Nursing note and vital signs reviewed.  Physical Exam  Constitutional: He is oriented to person, place, and time. He appears well-developed and well-nourished. No distress.  Cardiovascular: Normal rate, regular rhythm, normal heart sounds and intact distal pulses.   Pulmonary/Chest: Effort normal and breath sounds normal.  Abdominal: Normal appearance and bowel sounds are normal. He exhibits no mass. There is no hepatosplenomegaly. There is no tenderness. There is no rigidity, no rebound, no guarding, no tenderness at McBurney's point and negative Murphy's sign.  Neurological: He is alert and oriented to person, place, and time.  Skin: Skin is warm and dry.  Psychiatric: He has a normal mood and affect. His behavior is normal. Judgment and thought content normal.       Assessment & Plan:   Problem List Items Addressed This Visit      Digestive   Lower GI bleed - Primary    Symptoms of lower GI bleed resolved with no additional occurrences since hospitalization. Abdominal exam is benign. Scheduled for colonoscopy in one week. Obtain CBC to check hemoglobin status. Follow-up if symptoms return or worsen.      Relevant Orders   CBC

## 2015-12-29 NOTE — Progress Notes (Signed)
Pre visit review using our clinic review tool, if applicable. No additional management support is needed unless otherwise documented below in the visit note. 

## 2015-12-31 ENCOUNTER — Encounter: Payer: Self-pay | Admitting: Family

## 2016-01-03 ENCOUNTER — Encounter (HOSPITAL_COMMUNITY): Payer: Self-pay | Admitting: *Deleted

## 2016-01-03 ENCOUNTER — Ambulatory Visit (HOSPITAL_COMMUNITY)
Admission: RE | Admit: 2016-01-03 | Discharge: 2016-01-03 | Disposition: A | Discharge status: Home / Self Care | Payer: Medicare Other | Source: Ambulatory Visit | Attending: General Surgery | Admitting: General Surgery

## 2016-01-03 ENCOUNTER — Encounter (HOSPITAL_COMMUNITY)
Admission: RE | Disposition: A | Discharge status: Home / Self Care | Payer: Self-pay | Source: Ambulatory Visit | Attending: General Surgery

## 2016-01-03 DIAGNOSIS — I10 Essential (primary) hypertension: Secondary | ICD-10-CM | POA: Diagnosis not present

## 2016-01-03 DIAGNOSIS — K219 Gastro-esophageal reflux disease without esophagitis: Secondary | ICD-10-CM | POA: Diagnosis not present

## 2016-01-03 DIAGNOSIS — K5730 Diverticulosis of large intestine without perforation or abscess without bleeding: Secondary | ICD-10-CM | POA: Diagnosis not present

## 2016-01-03 DIAGNOSIS — Z7984 Long term (current) use of oral hypoglycemic drugs: Secondary | ICD-10-CM | POA: Insufficient documentation

## 2016-01-03 DIAGNOSIS — E119 Type 2 diabetes mellitus without complications: Secondary | ICD-10-CM | POA: Diagnosis not present

## 2016-01-03 DIAGNOSIS — Z7982 Long term (current) use of aspirin: Secondary | ICD-10-CM | POA: Diagnosis not present

## 2016-01-03 DIAGNOSIS — Z79899 Other long term (current) drug therapy: Secondary | ICD-10-CM | POA: Insufficient documentation

## 2016-01-03 DIAGNOSIS — D125 Benign neoplasm of sigmoid colon: Secondary | ICD-10-CM | POA: Insufficient documentation

## 2016-01-03 DIAGNOSIS — F17200 Nicotine dependence, unspecified, uncomplicated: Secondary | ICD-10-CM | POA: Diagnosis not present

## 2016-01-03 DIAGNOSIS — K921 Melena: Secondary | ICD-10-CM | POA: Insufficient documentation

## 2016-01-03 DIAGNOSIS — E7800 Pure hypercholesterolemia, unspecified: Secondary | ICD-10-CM | POA: Insufficient documentation

## 2016-01-03 HISTORY — PX: COLONOSCOPY: SHX5424

## 2016-01-03 HISTORY — DX: Unspecified osteoarthritis, unspecified site: M19.90

## 2016-01-03 LAB — GLUCOSE, CAPILLARY: GLUCOSE-CAPILLARY: 96 mg/dL (ref 65–99)

## 2016-01-03 SURGERY — COLONOSCOPY
Anesthesia: Moderate Sedation

## 2016-01-03 MED ORDER — MIDAZOLAM HCL 5 MG/5ML IJ SOLN
INTRAMUSCULAR | Status: AC
  Filled 2016-01-03: qty 5

## 2016-01-03 MED ORDER — SODIUM CHLORIDE 0.9 % IV SOLN
INTRAVENOUS | Status: DC
  Administered 2016-01-03: 1000 mL via INTRAVENOUS

## 2016-01-03 MED ORDER — MIDAZOLAM HCL 5 MG/5ML IJ SOLN
INTRAMUSCULAR | Status: DC | PRN
  Administered 2016-01-03: 3 mg via INTRAVENOUS

## 2016-01-03 MED ORDER — MEPERIDINE HCL 50 MG/ML IJ SOLN
INTRAMUSCULAR | Status: DC | PRN
  Administered 2016-01-03: 50 mg via INTRAVENOUS

## 2016-01-03 MED ORDER — MEPERIDINE HCL 50 MG/ML IJ SOLN
INTRAMUSCULAR | Status: AC
  Filled 2016-01-03: qty 1

## 2016-01-03 MED ORDER — STERILE WATER FOR IRRIGATION IR SOLN
Status: DC | PRN
  Administered 2016-01-03: 08:00:00

## 2016-01-03 NOTE — Op Note (Signed)
[facility] Patient Name: Luis [name] Procedure Date: 01/03/2016 7:26 AM MRN: [id number] Date of Birth: Feb 15, 1934 Attending MD: Aviva Signs , MD CSN: [id number] Age: 82 Admit Type: Outpatient Procedure:                Colonoscopy Indications:              Hematochezia Providers:                Aviva Signs, MD, Gwenlyn Fudge, RN, Georgeann Oppenheim,                            Technician Referring MD:              Medicines:                Midazolam 3 mg IV, Meperidine 50 mg IV Complications:            No immediate complications. Estimated blood loss:                            None. Estimated Blood Loss:     Estimated blood loss: none. Procedure:                Pre-Anesthesia Assessment:                           - Prior to the procedure, a History and Physical                            was performed, and patient medications and                            allergies were reviewed. The patient is competent.                            The risks and benefits of the procedure and the                            sedation options and risks were discussed with the                            patient. All questions were answered and informed                            consent was obtained. Patient identification and                            proposed procedure were verified by the physician,                            the nurse and the technician in the endoscopy                            suite. Mental Status Examination: alert and                            oriented. Airway  Examination: Mallampati Class II                            (the uvula but not tonsillar pillars visualized).                            Respiratory Examination: clear to auscultation. CV                            Examination: RRR, no murmurs, no S3 or S4.                            Prophylactic Antibiotics: The patient does not                            require prophylactic antibiotics. Prior        Anticoagulants: The patient has taken no previous                            anticoagulant or antiplatelet agents. ASA Grade                            Assessment: II - A patient with mild systemic                            disease. After reviewing the risks and benefits,                            the patient was deemed in satisfactory condition to                            undergo the procedure. The anesthesia plan was to                            use moderate sedation / analgesia (conscious                            sedation). Immediately prior to administration of                            medications, the patient was re-assessed for                            adequacy to receive sedatives. The heart rate,                            respiratory rate, oxygen saturations, blood                            pressure, adequacy of pulmonary ventilation, and                            response to care were monitored throughout the  procedure. The physical status of the patient was                            re-assessed after the procedure.                           After obtaining informed consent, the colonoscope                            was passed under direct vision. Throughout the                            procedure, the patient's blood pressure, pulse, and                            oxygen saturations were monitored continuously. The                            [id number]) scope was introduced through                            the anus and advanced to the the cecum, identified                            by the appendiceal orifice, ileocecal valve and                            palpation. The colonoscopy was performed with ease.                            The patient tolerated the procedure well.                            Anatomical landmarks were photographed. The quality                            of the bowel preparation was adequate. The  quality                            of the bowel preparation was adequate. Scope In: 7:33:26 AM Scope Out: 7:48:37 AM Scope Withdrawal Time: 0 hours 11 minutes 15 seconds  Total Procedure Duration: 0 hours 15 minutes 11 seconds  Findings:      The perianal and digital rectal examinations were normal.      Multiple medium-mouthed diverticula were found in the sigmoid colon and       descending colon.      A 3 mm polyp was found in the mid sigmoid colon. The polyp was sessile.       The polyp was removed with a hot snare. Resection was complete, and       retrieval was complete. Estimated blood loss: none.      The exam was otherwise without abnormality on direct and retroflexion       views. Impression:               - Moderate diverticulosis  in the sigmoid colon and                            in the descending colon.                           - One 3 mm polyp in the mid sigmoid colon, removed                            with a hot snare. Resected and retrieved.                           - The examination was otherwise normal on direct                            and retroflexion views. Moderate Sedation:      Moderate (conscious) sedation was administered by the endoscopy nurse       and supervised by the endoscopist. The following parameters were       monitored: oxygen saturation, heart rate, blood pressure, and response       to care. Recommendation:           - Patient has a contact number available for                            emergencies. The signs and symptoms of potential                            delayed complications were discussed with the                            patient. Return to normal activities tomorrow.                            Written discharge instructions were provided to the                            patient.                           - Written discharge instructions were provided to                            the patient.                           -  Resume previous diet.                           - Continue present medications.                           - Await pathology results.                           - No aspirin, ibuprofen, naproxen, or other  non-steroidal anti-inflammatory drugs for 3 days                            after polyp removal.                           - Repeat colonoscopy is recommended. The                            colonoscopy date will be determined after pathology                            results from today's exam become available for                            review. Procedure Code(s):        --- Professional ---                           [phone], Colonoscopy, flexible; with removal of                            tumor(s), polyp(s), or other lesion(s) by snare                            technique Diagnosis Code(s):        --- Professional ---                           D12.5, Benign neoplasm of sigmoid colon                           K92.1, Melena (includes Hematochezia)                           K57.30, Diverticulosis of large intestine without                            perforation or abscess without bleeding CPT copyright 2016 American Medical Association. All rights reserved. The codes documented in this report are preliminary and upon coder review may  be revised to meet current compliance requirements. Aviva Signs, MD Aviva Signs, MD 01/03/2016 7:59:18 AM This report has been signed electronically. Number of Addenda: 0

## 2016-01-03 NOTE — Interval H&P Note (Signed)
History and Physical Interval Note:  01/03/2016 7:25 AM  Luis [name]  has presented today for surgery, with the diagnosis of blood in stool  The various methods of treatment have been discussed with the patient and family. After consideration of risks, benefits and other options for treatment, the patient has consented to  Procedure(s): COLONOSCOPY (N/A) as a surgical intervention .  The patient's history has been reviewed, patient examined, no change in status, stable for surgery.  I have reviewed the patient's chart and labs.  Questions were answered to the patient's satisfaction.     Aviva Signs A

## 2016-01-03 NOTE — Discharge Instructions (Signed)
Diverticulosis °Diverticulosis is the condition that develops when small pouches (diverticula) form in the wall of your colon. Your colon, or large intestine, is where water is absorbed and stool is formed. The pouches form when the inside layer of your colon pushes through weak spots in the outer layers of your colon. °CAUSES  °No one knows exactly what causes diverticulosis. °RISK FACTORS °· Being older than 50. Your risk for this condition increases with age. Diverticulosis is rare in people younger than 40 years. By age 80, almost everyone has it. °· Eating a low-fiber diet. °· Being frequently constipated. °· Being overweight. °· Not getting enough exercise. °· Smoking. °· Taking over-the-counter pain medicines, like aspirin and ibuprofen. °SYMPTOMS  °Most people with diverticulosis do not have symptoms. °DIAGNOSIS  °Because diverticulosis often has no symptoms, health care providers often discover the condition during an exam for other colon problems. In many cases, a health care provider will diagnose diverticulosis while using a flexible scope to examine the colon (colonoscopy). °TREATMENT  °If you have never developed an infection related to diverticulosis, you may not need treatment. If you have had an infection before, treatment may include: °· Eating more fruits, vegetables, and grains. °· Taking a fiber supplement. °· Taking a live bacteria supplement (probiotic). °· Taking medicine to relax your colon. °HOME CARE INSTRUCTIONS  °· Drink at least 6-8 glasses of water each day to prevent constipation. °· Try not to strain when you have a bowel movement. °· Keep all follow-up appointments. °If you have had an infection before:  °· Increase the fiber in your diet as directed by your health care provider or dietitian. °· Take a dietary fiber supplement if your health care provider approves. °· Only take medicines as directed by your health care provider. °SEEK MEDICAL CARE IF:  °· You have abdominal  pain. °· You have bloating. °· You have cramps. °· You have not gone to the bathroom in 3 days. °SEEK IMMEDIATE MEDICAL CARE IF:  °· Your pain gets worse. °· Your bloating becomes very bad. °· You have a fever or chills, and your symptoms suddenly get worse. °· You begin vomiting. °· You have bowel movements that are bloody or black. °MAKE SURE YOU: °· Understand these instructions. °· Will watch your condition. °· Will get help right away if you are not doing well or get worse. °  °This information is not intended to replace advice given to you by your health care provider. Make sure you discuss any questions you have with your health care provider. °  °Document Released: 06/21/2004 Document Revised: 09/29/2013 Document Reviewed: 08/19/2013 °Elsevier Interactive Patient Education ©2016 [employer] °Colonoscopy, Care After °Refer to this sheet in the next few weeks. These instructions provide you with information on caring for yourself after your procedure. Your health care provider may also give you more specific instructions. Your treatment has been planned according to current medical practices, but problems sometimes occur. Call your health care provider if you have any problems or questions after your procedure. °WHAT TO EXPECT AFTER THE PROCEDURE  °After your procedure, it is typical to have the following: °· A small amount of blood in your stool. °· Moderate amounts of gas and mild abdominal cramping or bloating. °HOME CARE INSTRUCTIONS °· Do not drive, operate machinery, or sign important documents for 24 hours. °· You may shower and resume your regular physical activities, but move at a slower pace for the first 24 hours. °· Take frequent rest periods for the   first 24 hours.  Walk around or put a warm pack on your abdomen to help reduce abdominal cramping and bloating.  Drink enough fluids to keep your urine clear or pale yellow.  You may resume your normal diet as instructed by your health care  provider. Avoid heavy or fried foods that are hard to digest.  Avoid drinking alcohol for 24 hours or as instructed by your health care provider.  Only take over-the-counter or prescription medicines as directed by your health care provider.  If a tissue sample (biopsy) was taken during your procedure:  Do not take aspirin or blood thinners for 7 days, or as instructed by your health care provider.  Do not drink alcohol for 7 days, or as instructed by your health care provider.  Eat soft foods for the first 24 hours. SEEK MEDICAL CARE IF: You have persistent spotting of blood in your stool 2-3 days after the procedure. SEEK IMMEDIATE MEDICAL CARE IF:  You have more than a small spotting of blood in your stool.  You pass large blood clots in your stool.  Your abdomen is swollen (distended).  You have nausea or vomiting.  You have a fever.  You have increasing abdominal pain that is not relieved with medicine.   This information is not intended to replace advice given to you by your health care provider. Make sure you discuss any questions you have with your health care provider.    Colon Polyps Polyps are lumps of extra tissue growing inside the body. Polyps can grow in the large intestine (colon). Most colon polyps are noncancerous (benign). However, some colon polyps can become cancerous over time. Polyps that are larger than a pea may be harmful. To be safe, caregivers remove and test all polyps. CAUSES  Polyps form when mutations in the genes cause your cells to grow and divide even though no more tissue is needed. RISK FACTORS There are a number of risk factors that can increase your chances of getting colon polyps. They include:  Being older than 50 years.  Family history of colon polyps or colon cancer.  Long-term colon diseases, such as colitis or Crohn disease.  Being overweight.  Smoking.  Being inactive.  Drinking too much alcohol. SYMPTOMS  Most small  polyps do not cause symptoms. If symptoms are present, they may include:  Blood in the stool. The stool may look dark red or black.  Constipation or diarrhea that lasts longer than 1 week. DIAGNOSIS People often do not know they have polyps until their caregiver finds them during a regular checkup. Your caregiver can use 4 tests to check for polyps:  Digital rectal exam. The caregiver wears gloves and feels inside the rectum. This test would find polyps only in the rectum.  Barium enema. The caregiver puts a liquid called barium into your rectum before taking X-rays of your colon. Barium makes your colon look white. Polyps are dark, so they are easy to see in the X-ray pictures.  Sigmoidoscopy. A thin, flexible tube (sigmoidoscope) is placed into your rectum. The sigmoidoscope has a light and tiny camera in it. The caregiver uses the sigmoidoscope to look at the last third of your colon.  Colonoscopy. This test is like sigmoidoscopy, but the caregiver looks at the entire colon. This is the most common method for finding and removing polyps. TREATMENT  Any polyps will be removed during a sigmoidoscopy or colonoscopy. The polyps are then tested for cancer. PREVENTION  To help lower your  risk of getting more colon polyps:  Eat plenty of fruits and vegetables. Avoid eating fatty foods.  Do not smoke.  Avoid drinking alcohol.  Exercise every day.  Lose weight if recommended by your caregiver.  Eat plenty of calcium and folate. Foods that are rich in calcium include milk, cheese, and broccoli. Foods that are rich in folate include chickpeas, kidney beans, and spinach. HOME CARE INSTRUCTIONS Keep all follow-up appointments as directed by your caregiver. You may need periodic exams to check for polyps. SEEK MEDICAL CARE IF: You notice bleeding during a bowel movement.   This information is not intended to replace advice given to you by your health care provider. Make sure you discuss any  questions you have with your health care provider.

## 2016-01-06 ENCOUNTER — Encounter (HOSPITAL_COMMUNITY): Payer: Self-pay | Admitting: General Surgery

## 2016-01-26 ENCOUNTER — Ambulatory Visit: Payer: Medicare Other | Admitting: Neurology

## 2016-03-19 ENCOUNTER — Encounter: Payer: Self-pay | Admitting: Family

## 2016-03-19 ENCOUNTER — Ambulatory Visit (INDEPENDENT_AMBULATORY_CARE_PROVIDER_SITE_OTHER)
Admission: RE | Admit: 2016-03-19 | Discharge: 2016-03-19 | Disposition: A | Discharge status: Home / Self Care | Payer: Medicare Other | Source: Ambulatory Visit | Attending: Family | Admitting: Family

## 2016-03-19 ENCOUNTER — Telehealth: Payer: Self-pay | Admitting: Family

## 2016-03-19 ENCOUNTER — Ambulatory Visit (INDEPENDENT_AMBULATORY_CARE_PROVIDER_SITE_OTHER): Payer: Medicare Other | Admitting: Family

## 2016-03-19 VITALS — BP 120/60 | HR 82 | Temp 97.9°F | Ht 71.0 in | Wt 175.2 lb

## 2016-03-19 DIAGNOSIS — R634 Abnormal weight loss: Secondary | ICD-10-CM

## 2016-03-19 DIAGNOSIS — R05 Cough: Secondary | ICD-10-CM | POA: Diagnosis not present

## 2016-03-19 DIAGNOSIS — R059 Cough, unspecified: Secondary | ICD-10-CM

## 2016-03-19 MED ORDER — AZITHROMYCIN 250 MG PO TABS: ORAL_TABLET | ORAL | Status: DC

## 2016-03-19 MED ORDER — PROMETHAZINE-DM 6.25-15 MG/5ML PO SYRP: 5.0000 mL | ORAL_SOLUTION | Freq: Every evening | ORAL | Status: DC | PRN

## 2016-03-19 NOTE — Patient Instructions (Signed)
Chest x-ray. Once I see results, I will place appropriate order for antibiotics if needed.  Please follow up in one month to evaluate weight loss.  If there is no improvement in your symptoms, or if there is any worsening of symptoms, or if you have any additional concerns, please return for re-evaluation; or, if we are closed, consider going to the Emergency Room for evaluation if symptoms urgent.

## 2016-03-19 NOTE — Progress Notes (Signed)
Subjective:    Patient ID: Luis [name], male    DOB: 12-31-1933, 82 y.o.   MRN: [id number]   Luis [name] is a 82 y.o. male who presents today for an acute visit.    HPI Comments: Patient here for evaluation of nonproductive cough and fatigue, the past few weeks, worsening. Accompanied by wife. Has been taking tessalon perles without relief. Some relief with mucinex Dm. Using inhalers for COPD, following with pulmonology. Uses nebulizer 1-2 times per day with relief.  No fever, chills. Slight increase in baseline SOB, wheezing with COPD.  Denies depressed mood, lack of interest in eating, or trouble swallowing/tasting food. ( Per chart review, dysphagia listed on chart).  CVA 07/2015.  Past Medical History  Diagnosis Date  . COPD (chronic obstructive pulmonary disease) ([place])   . Hypertension   . Dysphagia   . High cholesterol   . Emphysema   . Emphysema of lung ([place])   . GERD (gastroesophageal reflux disease)   . Heart murmur     as a child  . Exertional shortness of breath   . Diabetes mellitus without complication ([place])   . Arthritis    Allergies: Review of patient's allergies indicates no known allergies. Current Outpatient Prescriptions on File Prior to Visit  Medication Sig Dispense Refill  . albuterol (PROVENTIL HFA;VENTOLIN HFA) 108 (90 BASE) MCG/ACT inhaler Inhale 2 puffs into the lungs every 6 (six) hours as needed for wheezing or shortness of breath. 1 Inhaler 11  . ANORO ELLIPTA 62.5-25 MCG/INH AEPB INHALE ONE PUFF BY MOUTH DAILY 60 each 2  . Cholecalciferol (D3 MAXIMUM STRENGTH) 5000 UNITS capsule Take 5,000 Units by mouth daily.    [name] donepezil (ARICEPT) 10 MG tablet Take 1 tablet daily 90 tablet 3  . indapamide (LOZOL) 2.5 MG tablet Take 2.5 mg by mouth daily.    . metFORMIN (GLUCOPHAGE) 500 MG tablet Take 500 mg by mouth daily.  2  . OXYGEN Inhale into the lungs. On 2 liters at night    . polyethylene glycol (MIRALAX / GLYCOLAX) packet Take 17 g by mouth  daily.    . Potassium Gluconate 595 MG CAPS Take 1 capsule by mouth daily.    . simvastatin (ZOCOR) 20 MG tablet TAKE ONE TABLET BY MOUTH DAILY AT 6 PM 90 tablet 1  . theophylline (UNIPHYL) 400 MG 24 hr tablet Take 200 mg by mouth 3 (three) times daily.  11  . nitroGLYCERIN (NITROSTAT) 0.4 MG SL tablet Place 0.4 mg under the tongue every 5 (five) minutes as needed for chest pain. Reported on 03/19/2016     No current facility-administered medications on file prior to visit.    Social History  Substance Use Topics  . Smoking status: Former Smoker -- 2.00 packs/day for 35 years    Types: Cigarettes    Quit date: 10/08/1985  . Smokeless tobacco: Former Systems developer    Types: Chew    Quit date: 10/08/1997  . Alcohol Use: No    Review of Systems  Constitutional: Positive for fatigue. Negative for fever, chills and appetite change.  HENT: Negative for congestion, sinus pressure and sore throat.   Respiratory: Positive for cough, shortness of breath (mildly worse than normal) and wheezing.   Cardiovascular: Negative for chest pain and palpitations.  Gastrointestinal: Negative for nausea and vomiting.  Neurological: Negative for dizziness.      Objective:    BP 120/60 mmHg  Pulse 82  Temp(Src) 97.9 F (36.6 C) (Oral)  Ht  5\' 11"  (1.803 m)  Wt 175 lb 4 oz (79.493 kg)  BMI 24.45 kg/m2  SpO2 93%   Physical Exam  Constitutional: Vital signs are normal. He appears well-developed and well-nourished.  HENT:  Head: Normocephalic and atraumatic.  Right Ear: Hearing, tympanic membrane, external ear and ear canal normal. No drainage, swelling or tenderness. Tympanic membrane is not injected, not erythematous and not bulging. No middle ear effusion. No decreased hearing is noted.  Left Ear: Hearing, tympanic membrane, external ear and ear canal normal. No drainage, swelling or tenderness. Tympanic membrane is not injected, not erythematous and not bulging.  No middle ear effusion. No decreased  hearing is noted.  Nose: Nose normal. Right sinus exhibits no maxillary sinus tenderness and no frontal sinus tenderness. Left sinus exhibits no maxillary sinus tenderness and no frontal sinus tenderness.  Mouth/Throat: Uvula is midline, oropharynx is clear and moist and mucous membranes are normal. No oropharyngeal exudate, posterior oropharyngeal edema, posterior oropharyngeal erythema or tonsillar abscesses.  Eyes: Conjunctivae are normal.  Cardiovascular: Regular rhythm and normal heart sounds.   Pulmonary/Chest: Effort normal and breath sounds normal. No respiratory distress. He has no wheezes. He has no rhonchi. He has no rales.  Mild decrease in breath sounds.   Lymphadenopathy:       Head (right side): No submental, no submandibular, no tonsillar, no preauricular, no posterior auricular and no occipital adenopathy present.       Head (left side): No submental, no submandibular, no tonsillar, no preauricular, no posterior auricular and no occipital adenopathy present.    He has no cervical adenopathy.  Neurological: He is alert.  Skin: Skin is warm and dry.  Psychiatric: He has a normal mood and affect. His speech is normal and behavior is normal.  Vitals reviewed.      Assessment & Plan:  1. Cough Bacterial URI v COPD exacerbation. No acute respiratory distress. Afebrile. SaO2 93%.CXR shows increased AP thoracic diameter of COPD. No acute cardiopulmonary disease. Encouraged continued use of COPD inhalants and f/u with pulmonology. Wife, husband, and I jointly decided to treat with an antibiotic due to co morbidities, age, and duration of worsening cough. Note, we discussed that I would expect fatigue in association with worsening cough. If this not improve with treatment, advised patient to f/u for further evaluation.   - DG Chest 2 View - promethazine-dextromethorphan (PROMETHAZINE-DM) 6.25-15 MG/5ML syrup; Take 5 mLs by mouth at bedtime as needed for cough.  Dispense: 40 mL; Refill:  1 - azithromycin (ZITHROMAX) 250 MG tablet; Tale 500 mg [name] on day 1, then 250 mg [name] q24h x 4 days.  Dispense: 6 tablet; Refill: 0  2. Weight loss Weight loss is non specific at this time. He has lost 7 pounds since March 2017. We discussed this being erroneous ( clothes, time of day) or perhaps a relationship to his short term memory loss after CVA 07/2015. He has no loss of appetite, dysphagia, or depressive symptoms. However, I am considering dysphagia as this appears to have been a problem s/p CVA as listed on his diagnoses. We agreed on close surveillance at this time and follow up in one month to check weight. Wife is going to watch more closely his eating habits prior to further work up. F/u 7/12 with PCP.   I am having Mr. Ohta maintain his polyethylene glycol, nitroGLYCERIN, Cholecalciferol, metFORMIN, theophylline, indapamide, albuterol, ANORO ELLIPTA, Potassium Gluconate, simvastatin, OXYGEN, donepezil, benzonatate, ipratropium-albuterol, NULYTELY WITH FLAVOR PACKS, and aspirin.   Meds ordered  this encounter  Medications  . benzonatate (TESSALON) 200 MG capsule    Sig: TAKE ONE CAPSULE BY MOUTH THREE TIMES DAILY AS NEEDED FOR COUGH    Refill:  3  . ipratropium-albuterol (DUONEB) 0.5-2.5 (3) MG/3ML SOLN    Sig: INHALE CONTENTS OF 1 VIAL IN NEBULIZER EVERY 6 HOURS AS NEEDED.    Refill:  2  . NULYTELY WITH FLAVOR PACKS 420 g solution    Sig: MIX AND TAKE AS DIRECTED.    Refill:  0  . aspirin 325 MG tablet    Sig: Take 325 mg by mouth daily.     Start medications as prescribed and explained to patient on After Visit Summary ( AVS). Risks, benefits, and alternatives of the medications and treatment plan prescribed today were discussed, and patient expressed understanding.   Education regarding symptom management and diagnosis given to patient.   Follow-up:Plan follow-up and return precautions given if any worsening symptoms or change in condition.   Continue to follow with  Luis Po, FNP for routine health maintenance.   Luis [name] and I agreed with plan.   Mable Paris, FNP

## 2016-03-19 NOTE — Telephone Encounter (Signed)
Gave patient Luis [name] response on x ray

## 2016-03-19 NOTE — Progress Notes (Signed)
Pre visit review using our clinic review tool, if applicable. No additional management support is needed unless otherwise documented below in the visit note. 

## 2016-03-20 NOTE — Telephone Encounter (Signed)
Noted in imaging tab. Closing this phone note.

## 2016-03-21 ENCOUNTER — Telehealth: Payer: Self-pay | Admitting: Internal Medicine

## 2016-03-21 NOTE — Telephone Encounter (Signed)
Spoke with pt's wife.  He went out in heat and then got short of breath.  He had taken a neb tx before going outside.  He was seen at PCP office on 6/12 and started on antibiotics >> CXR from then was negative for pneumonia.  He sat down inside and used his oxygen.  Since then he has been feeling better.  He is not having cough or wheeze.  Advised his wife to continue his current therapies and call back if his symptoms get worse again.

## 2016-03-21 NOTE — Telephone Encounter (Signed)
Spoke with the pt' spouse  She states pt went outside today and had episode where he could not catch his breath  He came inside and used neb tx and put on his o2 that he usually only does at night  She states that this did help  She is concerned b/c he is coughing a lot (non prod), and feels fatigued- was seen by PCP on 03/19/16 and started on zpack  CXR was done on that date and was neg for PNA  I advised will send a msg to [facility], and he should stay indoors on these hot and humid days  She verbalized understanding  Please advise thanks!

## 2016-03-21 NOTE — Telephone Encounter (Signed)
Message not addressed by DOD before leaving today. Sending message to VS to address. VS please advise.  Thanks!

## 2016-03-22 MED ORDER — PREDNISONE 10 MG PO TABS: ORAL_TABLET | ORAL | Status: DC

## 2016-03-22 NOTE — Telephone Encounter (Signed)
He is not going to tolerate this weather and needs to pace himself accordingly.  If he feels that his breathing is tighter recently, even indoors, then offer prednisone 10 mg, # 20, 4 X 2 DAYS, 3 X 2 DAYS, 2 X 2 DAYS, 1 X 2 DAYS

## 2016-03-22 NOTE — Telephone Encounter (Signed)
Spoke with the pt and his spouse  She states that pt still seems to be having increased SOB today, even while indoors  I have sent pred taper to pharm and they are to call if not improving for sooner ov

## 2016-03-22 NOTE — Addendum Note (Signed)
Addended by: Rosana Berger on: 03/22/2016 05:19 PM   Modules accepted: Orders

## 2016-03-22 NOTE — Telephone Encounter (Signed)
LMTCB and will hold in triage since msg closed

## 2016-03-27 ENCOUNTER — Ambulatory Visit: Payer: Medicare Other | Admitting: Internal Medicine

## 2016-04-06 ENCOUNTER — Other Ambulatory Visit: Payer: Self-pay | Admitting: Internal Medicine

## 2016-04-18 ENCOUNTER — Other Ambulatory Visit: Payer: Self-pay | Admitting: Family

## 2016-04-18 ENCOUNTER — Ambulatory Visit (INDEPENDENT_AMBULATORY_CARE_PROVIDER_SITE_OTHER): Payer: Medicare Other | Admitting: Internal Medicine

## 2016-04-18 ENCOUNTER — Encounter: Payer: Self-pay | Admitting: Family

## 2016-04-18 ENCOUNTER — Encounter: Payer: Self-pay | Admitting: Internal Medicine

## 2016-04-18 ENCOUNTER — Ambulatory Visit (INDEPENDENT_AMBULATORY_CARE_PROVIDER_SITE_OTHER): Payer: Medicare Other | Admitting: Family

## 2016-04-18 VITALS — BP 120/64 | HR 74 | Temp 97.4°F | Resp 18 | Ht 71.0 in | Wt 178.0 lb

## 2016-04-18 VITALS — BP 122/66 | HR 67 | Ht 71.0 in | Wt 178.6 lb

## 2016-04-18 DIAGNOSIS — J441 Chronic obstructive pulmonary disease with (acute) exacerbation: Secondary | ICD-10-CM

## 2016-04-18 DIAGNOSIS — J9611 Chronic respiratory failure with hypoxia: Secondary | ICD-10-CM | POA: Diagnosis not present

## 2016-04-18 DIAGNOSIS — R634 Abnormal weight loss: Secondary | ICD-10-CM

## 2016-04-18 MED ORDER — FLUTTER DEVI: Status: DC

## 2016-04-18 MED ORDER — GLYCOPYRROLATE-FORMOTEROL 9-4.8 MCG/ACT IN AERO: 2.0000 | INHALATION_SPRAY | Freq: Two times a day (BID) | RESPIRATORY_TRACT | Status: DC

## 2016-04-18 NOTE — Progress Notes (Signed)
10/27/12- 82 yo M former smoker- Pt referred by Dr Manuella Ghazi. pt states having sob upon heavy activity. Wife is a patient of mine. He had smoked one to 2 packs per day for 35 years, ending in 100. 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 He was diagnosed with COPD/emphysema by pulmonologist Dr. Koleen Nimrod from [place]. Shortness of breath with exertion for years is getting gradually worse. Cough occasionally productive of clear mucus with no blood. He can climb one flight of stairs without stopping- he's breathing hard at the top. No chest pain, palpitation or swelling. He has not noticed dramatic benefit from tudorza, albuterol HFA,, Advair 250. He also complains of anosmia for at least several years. He blames this on working with ammonia fumes. He is using Flonase and Atrovent nasal sprays Father died of DVT/PE mother died of stroke. He doesn't know of any family members with lung disease.  // needs a1AT//  11/27/12- 78 yoM former smoker followed for COPD       PCP Dr Monico Blitz FOLLOWS FOR: reivew PFT and 6MW with patient; still having SOB with heavy activity. Wife here. No change in no acute event since last here. Persistent dyspnea with exertion but he does 30 minutes daily at 3 miles per hour on treadmill. Likes ipratropium nasal spray. Has had pneumonia vaccine after age 68. CXR 10/28/12 IMPRESSION:  COPD. No active lung disease.  Original Report Authenticated By: Ivar Drape, M.D. PFT2/5/14- severe obstructive airways disease with slight response to bronchodilator. Air-trapping with increased residual volume. Diffusion mildly reduced. Emphysema pattern. FVC 2.56/59%, FEV1 1.17/41%, FEV1/FVC 0.45. Residual volume 132%, DLCO 78%. 6MWT-11/12/12- 96%, 94%, 96%, 454 m. Good distance with oxygenation well maintained.  06/02/13-79 yoM former smoker followed for COPD       PCP Dr Monico Blitz     Wife here FOLLOWS FOR: feels like he may be a  little worse than last visit with breathing; SOB with activity. Has questions/concerns with Tudorza-has cough and doesnt feel as well while taking it. Dyspnea on exertion is not improved. Has cough, productive clear phlegm.  Urinary syncope episode. a1AT- WNL MM 142  12/02/13- 79 yoM former smoker followed for COPD       PCP Dr Monico Blitz     Wife here FOLLOWS FOR: Pt states his breathing is not any better since last OV.  SOB with activity as well. Dyspnea with climbing 2 flights of stairs and has to stop to get his breath before going on. This is unchanged. Some cough with white phlegm. Denies chest pain, palpitations or edema. Uses treadmill and step exerciser some.  01/22/14 Acute OV Complains of increased SOB x2 weeks with prod cough with yellow mucus.  But says it is clear to yellow at times. No fever. Denies any wheezing, chest tightness, hemoptysis, f/c/s, nausea, vomiting.   Sats were 89% upon entering exam room. After rest 93% on RA. We discussed oxygen and he wants to wait for this .  CXR w/ no acute process , stable COPD 12/02/13  03/04/14- 79 yoM former smoker followed for Severe COPD, vasomotor rhinitis       PCP Dr Monico Blitz     Wife here FOLLOWS FOR: Pt states breathing is unchanged. Pt c/o of SOB with exertion and intermittent productive cough with yellow mucous. Denied CP/tightness.  Dyspnea doing yard work, climbing hills. Comfortable at rest and with ADLs. Home oximetry usually 90%, rarely 85 on  room air. Says cardiac tests were negative in the past.  07/05/14- 80 yoM former smoker followed for Severe COPD, vasomotor rhinitis       PCP Dr Monico Blitz     Wife here Has had flu shot FOLLOWS FOR: patient stays congested(morning time); SOB or wheezing with activity. Patient has been losing weight-recently dx'd with DM-diet controlled.   Has had flu vax Some wheeze. Daily light cough productive clear. Less DOE. Continuing theodur, Advair [place], Tudorza.  01/03/15- 80 yoM former  smoker followed for Severe COPD/ emphysema, vasomotor rhinitis       PCP Dr Monico Blitz     Wife here FOLLOWS FOR: Pt states he is fine when sitting(rest) but with exertion he gets SOB and wheezing.  09/27/2015-82 year old male former smoker followed for severe COPD/emphysema, vasomotor rhinitis FOLLOWS FOR:  Pt completed Doxy 100 x 1 week ago. Pt reports increased deep wet cough, SOB wheezing and chest congestion x 2-3 weeks.   CXR 1 view 07/15/2015 IMPRESSION: No acute cardiopulmonary process seen. Electronically Signed  By: Garald Balding M.D.  On: 07/15/2015 21:22  10/20/2015-82 year old male former smoker followed for COPD mixed type, vasomotor rhinitis, complicated by CVA/mild cognitive impairment  PCP Dr Monico Blitz     Wife here O2 2L sleep/ [facility] At last OV given xopenex and Depo-Medrol for COPD exacerbation Nurse who did his 6 minute walk came back to express concern that he seemed vague about details and he didn't know whether he has oxygen at home or not  FOLLOWS FOR: Wife states patient has been sick(cough; was tx'd by CY);has been using Duoneb neb tx's-seems to help pt.  6 minute walk test-saturation dropped to 89% on room air by end of walk, 412 m. Borderline but not qualifying for portable O2. Resolving acute bronchitis after doxycycline and Mucinex. Still frequent wheeze.  04/18/2016-82 year old male former smoker followed for COPD mixed type, vasomotor rhinitis, complicated by CVA/mild cognitive impairment, DM O2 2 L sleep/[facility] FOLLOWS FOR: Pt has wet cough-wheezing and chest congestion. CXR was done recently to check for PNA-was put on prednisone and did not help.  Walk Test on room air 04/18/2016-qualified for portable oxygen with desaturation to 86% and appropriate recovery on 2 L oxygen. Denies cough with meals, eats slowly. Persistent raspy wheezy cough. Dyspnea with exertion, comfortable at rest and sleeping on O2. Did not notice any benefit from Proventil  rescue inhaler or Breo. CXR 03/19/2016-NAD with emphysema changes, normal heart size  ROS-see HPI Constitutional:   No-   weight loss, night sweats, fevers, chills, fatigue, lassitude. HEENT:   No-  headaches, difficulty swallowing, tooth/dental problems, sore throat,       No-  sneezing, itching, ear ache, nasal congestion, post nasal drip,  CV:  No-   chest pain, orthopnea, PND, swelling in lower extremities, anasarca,                                                                            dizziness, palpitations Resp: +shortness of breath with exertion or at rest.             +productive cough,  No non-productive cough,  No- coughing up of blood.  No-   change in color of mucus.  + wheezing.   Skin: No-   rash or lesions. GI:  No-   heartburn, indigestion, abdominal pain, nausea, vomiting, GU: . MS:  No-   joint pain or swelling.  . Neuro-     nothing unusual Psych:  No- change in mood or affect. No depression or anxiety.  No memory loss.  OBJ- Physical Exam General- Alert, Oriented, Affect-appropriate, Distress- none acute, trim Skin- rash-none, lesions- none, excoriation- none Lymphadenopathy- none Head- atraumatic            Eyes- Gross vision intact, PERRLA, conjunctivae and secretions clear            Ears- Hearing, canals-normal            Nose- Clear, no-Septal dev, mucus, polyps, erosion, perforation             Throat- Mallampati II , mucosa clear , drainage- none, tonsils- atrophic Neck- flexible , trachea midline, no stridor , thyroid nl, carotid no bruit Chest - symmetrical excursion , unlabored           Heart/CV- RRR , no murmur , no gallop  , no rub, nl s1 s2                           - JVD- none , edema- none, stasis changes- none, varices- none           Lung-  diminished, wheeze- none, cough + rattling "wet" upper airway cough, dullness-none, rub- none           Chest wall-  Abd-  Br/ Gen/ Rectal- Not done, not indicated Extrem- cyanosis- none,  clubbing, none, atrophy- none, strength- nl Neuro- grossly intact to observation

## 2016-04-18 NOTE — Progress Notes (Signed)
Subjective:    Patient ID: [name], male    DOB: Feb 22, 1934, 82 y.o.   MRN: [id number]  Chief Complaint  Patient presents with  . Follow-up    weight loss    HPI:  INES STILSON is a 82 y.o. male who  has a past medical history of COPD (chronic obstructive pulmonary disease) ([place]); Hypertension; Dysphagia; High cholesterol; Emphysema; Emphysema of lung ([place]); GERD (gastroesophageal reflux disease); Heart murmur; Exertional shortness of breath; Diabetes mellitus without complication ([place]); and Arthritis. and presents today for a follow up office visit.   1.) Weight loss -  previously evaluated in the office with concerns for weight loss of approximately 7 pounds since March 2017. There was initial concern for dysphagia related to his CVA. He was recommended close surveillance and follow-up in one month. Reports eating well and swallowing without difficulty. Notes that he has gained 3 pounds since his last office visit.  Wt Readings from Last 3 Encounters:  04/18/16 178 lb (80.74 kg)  04/18/16 178 lb 9.6 oz (81.012 kg)  03/19/16 175 lb 4 oz (79.493 kg)    No Known Allergies   Current Outpatient Prescriptions on File Prior to Visit  Medication Sig Dispense Refill  . albuterol (PROVENTIL HFA;VENTOLIN HFA) 108 (90 BASE) MCG/ACT inhaler Inhale 2 puffs into the lungs every 6 (six) hours as needed for wheezing or shortness of breath. 1 Inhaler 11  . ANORO ELLIPTA 62.5-25 MCG/INH AEPB INHALE ONE PUFF BY MOUTH DAILY 60 each 0  . aspirin 325 MG tablet Take 325 mg by mouth daily.    . Cholecalciferol (D3 MAXIMUM STRENGTH) 5000 UNITS capsule Take 5,000 Units by mouth daily.    [name] donepezil (ARICEPT) 10 MG tablet Take 1 tablet daily 90 tablet 3  . Glycopyrrolate-Formoterol (BEVESPI AEROSPHERE) 9-4.8 MCG/ACT AERO Inhale 2 puffs into the lungs 2 (two) times daily. 1 Inhaler 0  . indapamide (LOZOL) 2.5 MG tablet Take 2.5 mg by mouth daily.    . metFORMIN (GLUCOPHAGE) 500 MG tablet Take 500  mg by mouth daily.  2  . nitroGLYCERIN (NITROSTAT) 0.4 MG SL tablet Place 0.4 mg under the tongue every 5 (five) minutes as needed for chest pain. Reported on 03/19/2016    . OXYGEN Inhale into the lungs. On 2 liters at night    . polyethylene glycol (MIRALAX / GLYCOLAX) packet Take 17 g by mouth daily.    . Potassium Gluconate 595 MG CAPS Take 1 capsule by mouth daily.    [name] Respiratory Therapy Supplies (FLUTTER) DEVI Use as directed 1 each 0  . simvastatin (ZOCOR) 20 MG tablet TAKE ONE TABLET BY MOUTH DAILY AT 6 PM 90 tablet 1  . theophylline (UNIPHYL) 400 MG 24 hr tablet Take 200 mg by mouth 3 (three) times daily.  11   No current facility-administered medications on file prior to visit.    Review of Systems  Constitutional: Negative for fever, chills, activity change, appetite change, fatigue and unexpected weight change.  Respiratory: Negative for chest tightness and shortness of breath.   Cardiovascular: Negative for chest pain, palpitations and leg swelling.      Objective:    BP 120/64 mmHg  Pulse 74  Temp(Src) 97.4 F (36.3 C) (Oral)  Resp 18  Ht 5\' 11"  (1.803 m)  Wt 178 lb (80.74 kg)  BMI 24.84 kg/m2  SpO2 90% Nursing note and vital signs reviewed.  Physical Exam  Constitutional: He is oriented to person, place, and time. He appears  well-developed and well-nourished. No distress.  Cardiovascular: Normal rate, regular rhythm, normal heart sounds and intact distal pulses.   Pulmonary/Chest: Effort normal. He has wheezes.  Neurological: He is alert and oriented to person, place, and time.  Skin: Skin is warm and dry.  Psychiatric: He has a normal mood and affect. His behavior is normal. Judgment and thought content normal.       Assessment & Plan:   Problem List Items Addressed This Visit      Other   Loss of weight - Primary    Previously evaluated for weight loss of 7 pounds over about a 2 month time frame which was approximately about 4% weight loss without  trying. Has regained 3 pounds since that visit and is eating well with no complications. BMI is 24 indicating a good weight. No further treatment is needed at this time. Continue to monitor.          I am having Mr. Casper maintain his polyethylene glycol, nitroGLYCERIN, Cholecalciferol, metFORMIN, theophylline, indapamide, albuterol, Potassium Gluconate, simvastatin, OXYGEN, donepezil, aspirin, ANORO ELLIPTA, FLUTTER, and Glycopyrrolate-Formoterol.   Follow-up: Return if symptoms worsen or fail to improve.  Mauricio Po, FNP

## 2016-04-18 NOTE — Assessment & Plan Note (Signed)
Cough suggest retained upper airway secretions. He does not have obvious left-sided heart failure. Plan-Flutter device, try sample of Bevespi inhaler

## 2016-04-18 NOTE — Assessment & Plan Note (Signed)
Already using oxygen for sleep. Bothersome easy dyspnea with exertion corresponds to desaturation demonstrated today on walk test. Plan-DME [facility] to add portable oxygen system for exertion at 2 L.

## 2016-04-18 NOTE — Patient Instructions (Signed)
Script printed for Flutter device    Blow through 4 times per set, three sets per day    To help clear mucus from your chest  Order- Walk test on room air      Dx COPD mixed type  Sample Bevespi  Inhaler     Inhale 2 puffs, twice daily     Try this instead of Anoro. See if it works better for your breathing.  Please call as needed

## 2016-04-18 NOTE — Assessment & Plan Note (Signed)
Previously evaluated for weight loss of 7 pounds over about a 2 month time frame which was approximately about 4% weight loss without trying. Has regained 3 pounds since that visit and is eating well with no complications. BMI is 24 indicating a good weight. No further treatment is needed at this time. Continue to monitor.

## 2016-04-18 NOTE — Patient Instructions (Signed)
Thank you for choosing [facility].  Summary/Instructions:  Please continue to take your medications as prescribed.   Continue to work with Pulmonology for the cough.  Your weight appears stable and we will continue to monitor.   If your symptoms worsen or fail to improve, please contact our office for further instruction, or in case of emergency go directly to the emergency room at the closest medical facility.

## 2016-04-25 ENCOUNTER — Other Ambulatory Visit: Payer: Self-pay | Admitting: Internal Medicine

## 2016-06-20 ENCOUNTER — Other Ambulatory Visit: Payer: Self-pay

## 2016-06-20 MED ORDER — INDAPAMIDE 2.5 MG PO TABS: 2.5000 mg | ORAL_TABLET | Freq: Every day | ORAL | 0 refills | Status: DC

## 2016-06-29 ENCOUNTER — Ambulatory Visit (INDEPENDENT_AMBULATORY_CARE_PROVIDER_SITE_OTHER): Payer: Medicare Other

## 2016-06-29 DIAGNOSIS — Z23 Encounter for immunization: Secondary | ICD-10-CM | POA: Diagnosis not present

## 2016-07-10 ENCOUNTER — Other Ambulatory Visit: Payer: Self-pay | Admitting: Internal Medicine

## 2016-07-10 NOTE — Telephone Encounter (Signed)
Ok to refill x 1 year 

## 2016-07-10 NOTE — Telephone Encounter (Signed)
CY please advise if you are okay with patient having Rx-this was removed from his list 04-2016. Thanks.

## 2016-07-11 ENCOUNTER — Encounter: Payer: Self-pay | Admitting: Neurology

## 2016-07-11 ENCOUNTER — Ambulatory Visit (INDEPENDENT_AMBULATORY_CARE_PROVIDER_SITE_OTHER): Payer: Medicare Other | Admitting: Neurology

## 2016-07-11 VITALS — BP 132/70 | HR 73 | Temp 98.1°F | Ht 71.0 in | Wt 178.1 lb

## 2016-07-11 DIAGNOSIS — I10 Essential (primary) hypertension: Secondary | ICD-10-CM | POA: Diagnosis not present

## 2016-07-11 DIAGNOSIS — G3184 Mild cognitive impairment, so stated: Secondary | ICD-10-CM

## 2016-07-11 DIAGNOSIS — E119 Type 2 diabetes mellitus without complications: Secondary | ICD-10-CM | POA: Diagnosis not present

## 2016-07-11 DIAGNOSIS — Z8673 Personal history of transient ischemic attack (TIA), and cerebral infarction without residual deficits: Secondary | ICD-10-CM | POA: Diagnosis not present

## 2016-07-11 MED ORDER — DONEPEZIL HCL 10 MG PO TABS: ORAL_TABLET | ORAL | 3 refills | Status: DC

## 2016-07-11 NOTE — Progress Notes (Signed)
NEUROLOGY FOLLOW UP OFFICE NOTE  CRISTY ABILA [id number]  HISTORY OF PRESENT ILLNESS: I had the pleasure of seeing Luis [name] in follow-up in the neurology clinic on 07/11/2016.  The patient was last seen 7 months ago after a small stroke and memory changes. He is again accompanied by his wife and son who help supplement the history today. MMSE in March 2017 was 27/30. Aricept was increased to 10mg  daily. Since his last visit, he feels his memory is okay. His family denies any worsening, he continues to have short-term memory changes, occasionally repeating himself. He drives locally and denies getting lost. He had to call his wife 2-3 days ago going to a doctor's visit that he had not been to in a long time. He takes his medications without need for reminders. He denies any missed bill payments. No personality changes or hallucinations, no difficulties with ADLs. He has been having poor appetite the past 3 weeks and not a lot of strength. He is noted to have a productive cough today, which patient and family reports has been ongoing. He denies any headaches, dizziness, diplopia, dysarthria, dysphagia, neck/back pain, focal numbness/tingling/weakness, bowel/bladder dysfunction.  HPI 07/26/15: This is a very pleasant 82 yo RH man with a history of hypertension, hyperlipidemia, diabetes, COPD, who presented for evaluation of worsening memory and hospital follow-up after recent stroke in October 2016. He feels his memory "could be better," he sometimes has to think when asked a question. His family reports memory changes started over the past year, he would forget names, conversations, ask the same questions. He would misplace things. His wife has recently started to pay attention to make sure he takes his medications, but he denies missing any doses. He continues to balance their checkbook and denies any missed bill payments. Prior to the stroke, he was driving and denied getting lost, but family has  noticed his wife was doing more of the driving. He has occasional word-finding difficulties. He denies any difficulties with ADLs. On 07/15/15, they were at a picnic when family noticed that something was "not right," he had rambling speech and was not making sense, starting off a sentence then drifting off. There was no slurred speech or focal weakness noted. He was brought to [facility] where MRI brain showed a small acute infarct in the left external capsule. I personally reviewed MRI brain, in addition there was moderate chronic microvascular disease. Carotid dopplers showed less than 50% stenosis bilaterally, mild plaque at the level of the carotid bulbs and ICA as well as the right common carotid artery. Echocardiogram showed EF 65-70%, normal left atrium. He had been taking low dose aspirin, and was switched to full dose 325mg  aspirin. Lipid panel showed total cholesterol 164, LDL 88. Simvastatin dose increased to 20mg  daily. HbA1c 6.8. He was also diagnosed with hyponatremia with sodium of 128 and hypoxia from COPD exacerbation. On hospital discharge, sodium level was 132. He was back to cognitive baseline. He denies any significant head injuries. No family history of dementia. He has always had a poor sense of smell.   PAST MEDICAL HISTORY: Past Medical History:  Diagnosis Date  . Arthritis   . COPD (chronic obstructive pulmonary disease) ([place])   . Diabetes mellitus without complication ([place])   . Dysphagia   . Emphysema   . Emphysema of lung ([place])   . Exertional shortness of breath   . GERD (gastroesophageal reflux disease)   . Heart murmur    as a child  .  High cholesterol   . Hypertension     MEDICATIONS: Current Outpatient Prescriptions on File Prior to Visit  Medication Sig Dispense Refill  . Cholecalciferol (D3 MAXIMUM STRENGTH) 5000 UNITS capsule Take 5,000 Units by mouth daily.    . metFORMIN (GLUCOPHAGE) 500 MG tablet Take 500 mg by mouth daily.  2  . Potassium Gluconate 595  MG CAPS Take 1 capsule by mouth daily.    [name] albuterol (PROVENTIL HFA;VENTOLIN HFA) 108 (90 BASE) MCG/ACT inhaler Inhale 2 puffs into the lungs every 6 (six) hours as needed for wheezing or shortness of breath. 1 Inhaler 11  . ANORO ELLIPTA 62.5-25 MCG/INH AEPB INHALE ONE PUFF BY MOUTH DAILY 60 each 0  . aspirin 325 MG tablet Take 325 mg by mouth daily.    [name] donepezil (ARICEPT) 10 MG tablet Take 1 tablet daily 90 tablet 3  . Glycopyrrolate-Formoterol (BEVESPI AEROSPHERE) 9-4.8 MCG/ACT AERO Inhale 2 puffs into the lungs 2 (two) times daily. 1 Inhaler 0  . indapamide (LOZOL) 2.5 MG tablet Take 1 tablet (2.5 mg total) by mouth daily. 90 tablet 0  . ipratropium-albuterol (DUONEB) 0.5-2.5 (3) MG/3ML SOLN INHALE CONTENTS OF 1 VIAL IN NEBULIZER EVERY 6 HOURS AS NEEDED. 90 mL 11  . nitroGLYCERIN (NITROSTAT) 0.4 MG SL tablet Place 0.4 mg under the tongue every 5 (five) minutes as needed for chest pain. Reported on 03/19/2016    . OXYGEN Inhale into the lungs. On 2 liters at night    . polyethylene glycol (MIRALAX / GLYCOLAX) packet Take 17 g by mouth daily.    [name] Respiratory Therapy Supplies (FLUTTER) DEVI Use as directed 1 each 0  . simvastatin (ZOCOR) 20 MG tablet TAKE ONE TABLET BY MOUTH DAILY AT 6 PM 90 tablet 0  . theophylline (UNIPHYL) 400 MG 24 hr tablet TAKE 1/2 TABLET BY MOUTH THREE TIMES DAILY 45 tablet 5   No current facility-administered medications on file prior to visit.     ALLERGIES: No Known Allergies  FAMILY HISTORY: Family History  Problem Relation Age of Onset  . Stroke Mother   . Healthy Father     SOCIAL HISTORY: Social History   Social History  . Marital status: Married    Spouse name: N/A  . Number of children: 3  . Years of education: 12   Occupational History  . Retired    Social History Main Topics  . Smoking status: Former Smoker    Packs/day: 2.00    Years: 35.00    Types: Cigarettes    Quit date: 10/08/1985  . Smokeless tobacco: Former Systems developer    Types:  Chew    Quit date: 10/08/1997  . Alcohol use No  . Drug use: No  . Sexual activity: Not Currently   Other Topics Concern  . Not on file   Social History Narrative   Fun: Golf   Denies abuse and feels safe at home.     REVIEW OF SYSTEMS: Constitutional: No fevers, chills, or sweats, no generalized fatigue, change in appetite Eyes: No visual changes, double vision, eye pain Ear, nose and throat: No hearing loss, ear pain, nasal congestion, sore throat Cardiovascular: No chest pain, palpitations Respiratory:  No shortness of breath at rest or with exertion, wheezes GastrointestinaI: No nausea, vomiting, diarrhea, abdominal pain, fecal incontinence Genitourinary:  No dysuria, urinary retention or frequency Musculoskeletal:  No neck pain, back pain Integumentary: No rash, pruritus, skin lesions Neurological: as above Psychiatric: No depression, insomnia, anxiety Endocrine: No palpitations, fatigue, diaphoresis, mood swings,  change in appetite, change in weight, increased thirst Hematologic/Lymphatic:  No anemia, purpura, petechiae. Allergic/Immunologic: no itchy/runny eyes, nasal congestion, recent allergic reactions, rashes  PHYSICAL EXAM: Vitals:   07/11/16 1111  BP: 132/70  Pulse: 73  Temp: 98.1 F (36.7 C)   General: No acute distress Head:  Normocephalic/atraumatic Neck: supple, no paraspinal tenderness, full range of motion Heart:  Regular rate and rhythm Lungs:  Clear to auscultation bilaterally Back: No paraspinal tenderness Skin/Extremities: No rash, no edema Neurological Exam: alert and oriented to person, place, and month/day of week. No aphasia or dysarthria. Fund of knowledge is appropriate.  Remote memory intact.  Attention and concentration are normal.    Able to name objects and repeat phrases. CDT 5/5 MMSE - Mini Mental State Exam 07/11/2016 12/28/2015 07/26/2015  Orientation to time 3 5 4   Orientation to Place 5 5 4   Registration 3 3 3   Attention/  Calculation 4 5 4   Recall 0 0 1  Language- name 2 objects 2 2 2   Language- repeat 1 1 1   Language- follow 3 step command 3 3 3   Language- read & follow direction 1 1 1   Write a sentence 1 1 1   Copy design 1 1 1   Total score 24 27 25    Cranial nerves: Pupils equal, round, reactive to light.  Extraocular movements intact with no nystagmus. Visual fields full. Facial sensation intact. No facial asymmetry. Tongue, uvula, palate midline.  Motor: Fasciculations noted on the left thenar muscles. Bulk and tone normal, muscle strength 5/5 throughout with no pronator drift.  Sensation to light touch intact.  No extinction to double simultaneous stimulation.  Deep tendon reflexes +2 throughout, toes downgoing.  Finger to nose testing intact.  Gait narrow-based and steady, able to tandem walk adequately.  Romberg negative.  IMPRESSION: This is a pleasant 82 yo RH man with a history of hypertension, hyperlipidemia, diabetes, COPD, with worsening memory and small left subcortical stroke last 07/15/15. Stroke appears to be due to small vessel disease, stroke workup unremarkable. He continues on full dose aspirin and higher dose statin. Family reports memory is stable. There has been some decline in MMSE today, 24/30 (27/30 in March 2017, 25/30 in October 2016). Continue Aricept 10mg  daily. We again discussed control of vascular risk factors and daily aspirin, as well as physical exercise and brain stimulation exercises for brain health. Continue to monitor driving. He will discuss poor appetite and cough with his PCP. He will follow-up in 1 year and knows to call our office for any problems.   Thank you for allowing me to participate in his care.  Please do not hesitate to call for any questions or concerns.  The duration of this appointment visit was 25 minutes of face-to-face time with the patient.  Greater than 50% of this time was spent in counseling, explanation of diagnosis, planning of further management,  and coordination of care.   Luis [name], M.D.   CC: Luis Po, FNP

## 2016-07-11 NOTE — Patient Instructions (Signed)
1. Continue Donepezil 10mg : take 1 tablet daily 2. Continue daily aspirin, control of blood pressure, cholesterol. Physical exercise and brain stimulation exercises are important for brain health 3. Discuss symptoms of fatigue and loss of appetite with your PCP 4. Follow-up in 1 year, call for any changes

## 2016-07-13 ENCOUNTER — Encounter: Payer: Self-pay | Admitting: Internal Medicine

## 2016-07-13 ENCOUNTER — Telehealth: Payer: Self-pay

## 2016-07-13 ENCOUNTER — Ambulatory Visit (INDEPENDENT_AMBULATORY_CARE_PROVIDER_SITE_OTHER): Payer: Medicare Other | Admitting: Internal Medicine

## 2016-07-13 ENCOUNTER — Other Ambulatory Visit (INDEPENDENT_AMBULATORY_CARE_PROVIDER_SITE_OTHER): Payer: Medicare Other

## 2016-07-13 ENCOUNTER — Ambulatory Visit (INDEPENDENT_AMBULATORY_CARE_PROVIDER_SITE_OTHER)
Admission: RE | Admit: 2016-07-13 | Discharge: 2016-07-13 | Disposition: A | Discharge status: Home / Self Care | Payer: Medicare Other | Source: Ambulatory Visit | Attending: Internal Medicine | Admitting: Internal Medicine

## 2016-07-13 VITALS — BP 136/74 | HR 72 | Temp 98.4°F | Resp 20 | Wt 179.0 lb

## 2016-07-13 DIAGNOSIS — E119 Type 2 diabetes mellitus without complications: Secondary | ICD-10-CM | POA: Diagnosis not present

## 2016-07-13 DIAGNOSIS — J441 Chronic obstructive pulmonary disease with (acute) exacerbation: Secondary | ICD-10-CM

## 2016-07-13 DIAGNOSIS — R05 Cough: Secondary | ICD-10-CM

## 2016-07-13 DIAGNOSIS — E876 Hypokalemia: Secondary | ICD-10-CM

## 2016-07-13 DIAGNOSIS — I10 Essential (primary) hypertension: Secondary | ICD-10-CM | POA: Diagnosis not present

## 2016-07-13 DIAGNOSIS — R531 Weakness: Secondary | ICD-10-CM | POA: Diagnosis not present

## 2016-07-13 DIAGNOSIS — R059 Cough, unspecified: Secondary | ICD-10-CM

## 2016-07-13 LAB — CBC WITH DIFFERENTIAL/PLATELET
Basophils Absolute: 0 10*3/uL (ref 0.0–0.1)
Basophils Relative: 0.2 % (ref 0.0–3.0)
EOS PCT: 0.8 % (ref 0.0–5.0)
Eosinophils Absolute: 0.1 10*3/uL (ref 0.0–0.7)
HCT: 43.3 % (ref 39.0–52.0)
Hemoglobin: 14.9 g/dL (ref 13.0–17.0)
LYMPHS ABS: 1 10*3/uL (ref 0.7–4.0)
Lymphocytes Relative: 6.8 % — ABNORMAL LOW (ref 12.0–46.0)
MCHC: 34.5 g/dL (ref 30.0–36.0)
MCV: 90.7 fl (ref 78.0–100.0)
MONO ABS: 1.1 10*3/uL — AB (ref 0.1–1.0)
MONOS PCT: 7.6 % (ref 3.0–12.0)
NEUTROS ABS: 12.7 10*3/uL — AB (ref 1.4–7.7)
NEUTROS PCT: 84.6 % — AB (ref 43.0–77.0)
PLATELETS: 292 10*3/uL (ref 150.0–400.0)
RBC: 4.77 Mil/uL (ref 4.22–5.81)
RDW: 13 % (ref 11.5–15.5)
WBC: 15 10*3/uL — ABNORMAL HIGH (ref 4.0–10.5)

## 2016-07-13 LAB — HEPATIC FUNCTION PANEL
ALK PHOS: 88 U/L (ref 39–117)
ALT: 17 U/L (ref 0–53)
AST: 22 U/L (ref 0–37)
Albumin: 4.1 g/dL (ref 3.5–5.2)
BILIRUBIN DIRECT: 0.2 mg/dL (ref 0.0–0.3)
BILIRUBIN TOTAL: 0.9 mg/dL (ref 0.2–1.2)
Total Protein: 7.4 g/dL (ref 6.0–8.3)

## 2016-07-13 LAB — BASIC METABOLIC PANEL
BUN: 11 mg/dL (ref 6–23)
CALCIUM: 9.8 mg/dL (ref 8.4–10.5)
CO2: 40 mEq/L — ABNORMAL HIGH (ref 19–32)
CREATININE: 0.86 mg/dL (ref 0.40–1.50)
Chloride: 79 mEq/L — ABNORMAL LOW (ref 96–112)
GFR: 90.83 mL/min (ref >60.00)
Glucose, Bld: 145 mg/dL — ABNORMAL HIGH (ref 70–99)
Potassium: 2.8 mEq/L — CL (ref 3.5–5.1)
SODIUM: 126 meq/L — AB (ref 135–145)

## 2016-07-13 LAB — TSH: TSH: 1.75 u[IU]/mL (ref 0.35–4.50)

## 2016-07-13 LAB — HEMOGLOBIN A1C: HEMOGLOBIN A1C: 6.6 % — AB (ref 4.6–6.5)

## 2016-07-13 MED ORDER — HYDROCODONE-HOMATROPINE 5-1.5 MG/5ML PO SYRP: 5.0000 mL | ORAL_SOLUTION | Freq: Four times a day (QID) | ORAL | 0 refills | Status: AC | PRN

## 2016-07-13 MED ORDER — POTASSIUM CHLORIDE ER 10 MEQ PO TBCR: 20.0000 meq | EXTENDED_RELEASE_TABLET | Freq: Every day | ORAL | 0 refills | Status: DC

## 2016-07-13 MED ORDER — PREDNISONE 10 MG PO TABS: ORAL_TABLET | ORAL | 0 refills | Status: DC

## 2016-07-13 MED ORDER — METHYLPREDNISOLONE ACETATE 40 MG/ML IJ SUSP
40.0000 mg | Freq: Once | INTRAMUSCULAR | Status: AC
  Administered 2016-07-13: 40 mg via INTRAMUSCULAR

## 2016-07-13 MED ORDER — LEVOFLOXACIN 250 MG PO TABS: 250.0000 mg | ORAL_TABLET | Freq: Every day | ORAL | 0 refills | Status: DC

## 2016-07-13 NOTE — Patient Instructions (Signed)
You had the steroid shot today  Please take all new medication as prescribed - the antibiotic, cough medicine, and prednisone   Please continue all other medications as before, including the nebulizer and oxygen  Please have the pharmacy call with any other refills you may need.  Please keep your appointments with your specialists as you may have planned  Please go to the XRAY Department in the Basement (go straight as you get off the elevator) for the x-ray testing  Please go to the LAB in the Basement (turn left off the elevator) for the tests to be done today  You will be contacted by phone if any changes need to be made immediately.  Otherwise, you will receive a letter about your results with an explanation, but please check with MyChart first.  Please remember to sign up for MyChart if you have not done so, as this will be important to you in the future with finding out test results, communicating by private email, and scheduling acute appointments online when needed.

## 2016-07-13 NOTE — Progress Notes (Signed)
Subjective:    Patient ID: Luis [name], male    DOB: [birth year], 82 y.o.   MRN: [id number]  HPI  Pt with hx of COPD/nighttime o2/daytime o2 prn, and CVA/mild memory impaired here with wife who provides most of the hx; Here with acute onset mild to mod 3 wks ST, HA, general weakness and malaise, with prod cough greenish sputum assoc with more recent worsening cough frequency, mild wheeze and sob/doe, , but denies increased o2 need, chest pain, increased sob or doe, wheezing, orthopnea, PND, increased LE swelling, palpitations, dizziness or syncope.  Nebs at home help somewhat.  Wife very concerned he just cant seem to shake his illness, with cough, general weakness for 3 wks, has been slightly more unsteady but no falls. Past Medical History:  Diagnosis Date  . Arthritis   . COPD (chronic obstructive pulmonary disease) ([place])   . Diabetes mellitus without complication ([place])   . Dysphagia   . Emphysema   . Emphysema of lung ([place])   . Exertional shortness of breath   . GERD (gastroesophageal reflux disease)   . Heart murmur    as a child  . High cholesterol   . Hypertension    Past Surgical History:  Procedure Laterality Date  . CATARACT EXTRACTION W/ INTRAOCULAR LENS IMPLANT Left 2011  . COLONOSCOPY N/A 01/03/2016   Procedure: COLONOSCOPY;  Surgeon: Aviva Signs, MD;  [facility]: [facility];  Service: Gastroenterology;  Laterality: N/A;  . ESOPHAGEAL DILATION  2000's   x2  . EYE SURGERY    . INGUINAL HERNIA REPAIR Right 1970's  . INGUINAL HERNIA REPAIR Bilateral 03/12/2013   w/mesh bilaterally/notes 03/12/2013  . INGUINAL HERNIA REPAIR Bilateral 03/12/2013   Procedure: HERNIA REPAIR INGUINAL ADULT BILATERAL;  Surgeon: Merrie Roof, MD;  Location: [facility];  Service: General;  Laterality: Bilateral;  . INSERTION OF MESH Bilateral 03/12/2013   Procedure: INSERTION OF MESH;  Surgeon: Merrie Roof, MD;  Location: [facility];  Service: General;  Laterality: Bilateral;  . RETINAL DETACHMENT  SURGERY Right 08/29/2010  . TONSILLECTOMY  1940's  . TRANSURETHRAL RESECTION OF PROSTATE  2002; 2013    reports that he quit smoking about 30 years ago. His smoking use included Cigarettes. He has a 70.00 pack-year smoking history. He quit smokeless tobacco use about 18 years ago. His smokeless tobacco use included Chew. He reports that he does not drink alcohol or use drugs. family history includes Healthy in his father; Stroke in his mother. No Known Allergies Current Outpatient Prescriptions on File Prior to Visit  Medication Sig Dispense Refill  . albuterol (PROVENTIL HFA;VENTOLIN HFA) 108 (90 BASE) MCG/ACT inhaler Inhale 2 puffs into the lungs every 6 (six) hours as needed for wheezing or shortness of breath. 1 Inhaler 11  . ANORO ELLIPTA 62.5-25 MCG/INH AEPB INHALE ONE PUFF BY MOUTH DAILY 60 each 0  . aspirin 325 MG tablet Take 325 mg by mouth daily.    . Cholecalciferol (D3 MAXIMUM STRENGTH) 5000 UNITS capsule Take 5,000 Units by mouth daily.    [name] donepezil (ARICEPT) 10 MG tablet Take 1 tablet daily 90 tablet 3  . indapamide (LOZOL) 2.5 MG tablet Take 2.5 mg by mouth daily.    [name] ipratropium-albuterol (DUONEB) 0.5-2.5 (3) MG/3ML SOLN INHALE CONTENTS OF 1 VIAL IN NEBULIZER EVERY 6 HOURS AS NEEDED. 90 mL 11  . metFORMIN (GLUCOPHAGE) 500 MG tablet Take 500 mg by mouth daily.  2  . nitroGLYCERIN (NITROSTAT) 0.4 MG SL tablet  Place 0.4 mg under the tongue every 5 (five) minutes as needed for chest pain. Reported on 03/19/2016    . OXYGEN Inhale into the lungs. On 2 liters at night    . polyethylene glycol (MIRALAX / GLYCOLAX) packet Take 17 g by mouth daily.    . Potassium Gluconate 595 MG CAPS Take 1 capsule by mouth daily.    [name] Respiratory Therapy Supplies (FLUTTER) DEVI Use as directed 1 each 0  . simvastatin (ZOCOR) 20 MG tablet TAKE ONE TABLET BY MOUTH DAILY AT 6 PM 90 tablet 0  . theophylline (UNIPHYL) 400 MG 24 hr tablet TAKE 1/2 TABLET BY MOUTH THREE TIMES DAILY 45 tablet 5  .  Glycopyrrolate-Formoterol (BEVESPI AEROSPHERE) 9-4.8 MCG/ACT AERO Inhale 2 puffs into the lungs 2 (two) times daily. (Patient not taking: Reported on 07/11/2016) 1 Inhaler 0   No current facility-administered medications on file prior to visit.    Review of Systems  Constitutional: Negative for unusual diaphoresis or night sweats HENT: Negative for ear swelling or discharge Eyes: Negative for worsening visual haziness  Respiratory: Negative for choking and stridor.   Gastrointestinal: Negative for distension or worsening eructation Genitourinary: Negative for retention or change in urine volume.  Musculoskeletal: Negative for other MSK pain or swelling Skin: Negative for color change and worsening wound Neurological: Negative for tremors and numbness other than noted  Psychiatric/Behavioral: Negative for decreased concentration or agitation other than above       Objective:   Physical Exam BP 136/74   Pulse 72   Temp 98.4 F (36.9 C) (Oral)   Resp 20   Wt 179 lb (81.2 kg)   SpO2 92%   BMI 24.97 kg/m  VS noted, normal wt but fatigued and frail appearing Constitutional: Pt appears in no apparent distress HENT: Head: NCAT.  Right Ear: External ear normal.  Left Ear: External ear normal.  Eyes: . Pupils are equal, round, and reactive to light. Conjunctivae and EOM are normal Neck: Normal range of motion. Neck supple.  Cardiovascular: Normal rate and regular rhythm.   Pulmonary/Chest: Effort normal and breath sounds decreased bilat without rales but with few scattered wheeze and rhonchi  Neurological: Pt is alert. Not confused , motor grossly intact Skin: Skin is warm. No rash, no LE edema Psychiatric: Pt behavior is normal. No agitation.      Assessment & Plan:

## 2016-07-13 NOTE — Telephone Encounter (Signed)
Ok for corinne to contact pt  K mildly low, but will need several days of tx to help correct  OK for kdur 20 qd for 5 days, then re-check lab in 1 wk please

## 2016-07-13 NOTE — Progress Notes (Signed)
Pre visit review using our clinic review tool, if applicable. No additional management support is needed unless otherwise documented below in the visit note. 

## 2016-07-13 NOTE — Telephone Encounter (Signed)
Patient aware.

## 2016-07-13 NOTE — Telephone Encounter (Signed)
Lab called with critical on patient. Potassium is 2.8

## 2016-07-14 NOTE — Assessment & Plan Note (Signed)
stable overall by history and exam, recent data reviewed with pt, and pt to continue medical treatment as before,  to f/u any worsening symptoms or concerns Lab Results  Component Value Date   HGBA1C 6.6 (H) 07/13/2016

## 2016-07-14 NOTE — Assessment & Plan Note (Signed)
Mild to mod, for depomedrol IM 80, predpac asd, empiric antibx course,  to f/u any worsening symptoms or concerns

## 2016-07-14 NOTE — Assessment & Plan Note (Signed)
stable overall by history and exam, recent data reviewed with pt, and pt to continue medical treatment as before,  to f/u any worsening symptoms or concerns BP Readings from Last 3 Encounters:  07/13/16 136/74  07/11/16 132/70  04/18/16 120/64

## 2016-07-14 NOTE — Assessment & Plan Note (Addendum)
Mild to mod, suspect bronchitis like illness but cant r/o pna, for cxr, cough med prn, empiric antibx as above,  to f/u any worsening symptoms or concerns  Note:  Total time for pt hx, exam, review of record with pt in the room, determination of diagnoses and plan for further eval and tx is > 40 min, with over 50% spent in coordination and counseling of patient

## 2016-07-14 NOTE — Assessment & Plan Note (Addendum)
Etiology unclear, Exam otherwise benign, to check labs as documented, follow with expectant management, primarily likely related to recent illness, consider PT eval

## 2016-07-20 ENCOUNTER — Encounter: Payer: Self-pay | Admitting: Adult Health

## 2016-07-20 ENCOUNTER — Ambulatory Visit (INDEPENDENT_AMBULATORY_CARE_PROVIDER_SITE_OTHER): Payer: Medicare Other | Admitting: Adult Health

## 2016-07-20 ENCOUNTER — Other Ambulatory Visit (INDEPENDENT_AMBULATORY_CARE_PROVIDER_SITE_OTHER): Payer: Medicare Other

## 2016-07-20 ENCOUNTER — Other Ambulatory Visit: Payer: Self-pay | Admitting: Family

## 2016-07-20 DIAGNOSIS — J441 Chronic obstructive pulmonary disease with (acute) exacerbation: Secondary | ICD-10-CM

## 2016-07-20 DIAGNOSIS — E876 Hypokalemia: Secondary | ICD-10-CM | POA: Diagnosis not present

## 2016-07-20 LAB — BASIC METABOLIC PANEL
BUN: 14 mg/dL (ref 6–23)
CHLORIDE: 89 meq/L — AB (ref 96–112)
CO2: 39 meq/L — AB (ref 19–32)
Calcium: 9.5 mg/dL (ref 8.4–10.5)
Creatinine, Ser: 0.92 mg/dL (ref 0.40–1.50)
GFR: 84.02 mL/min (ref >60.00)
GLUCOSE: 110 mg/dL — AB (ref 70–99)
POTASSIUM: 3.4 meq/L — AB (ref 3.5–5.1)
SODIUM: 132 meq/L — AB (ref 135–145)

## 2016-07-20 MED ORDER — FLUTICASONE FUROATE-VILANTEROL 100-25 MCG/INH IN AEPB: 1.0000 | INHALATION_SPRAY | Freq: Every day | RESPIRATORY_TRACT | 5 refills | Status: DC

## 2016-07-20 NOTE — Progress Notes (Signed)
Subjective:    Patient ID: Luis [name], male    DOB: Jun 04, 1934, 82 y.o.   MRN: [id number]  HPI 82 yo former smoker followed for severe COPD /emphysema on O2   07/20/2016 Acute OV  Patient presents for an acute office visit. Patient complains over the last 3 weeks that he's had increased cough, congestion with thick yellow to green mucus, shortness of breath and intermittent wheezing. Patient was seen by his primary care physician on 07/13/2016. Chest x-ray showed no acute process. Labs did show a elevated white count at 15,000. His potassium was low. He was started on potassium replacement. Patient was given Levaquin and a prednisone taper. Patient says that he has had some improvement but this morning he woke up and felt more congested . Once he took a breathing treatment and not moving. He is feeling better. He has 2-3 days left of his antibiotic and steroid taper. Patient was previously on [place] but did not feel that these helped very much. And therefore has not been taken for the last couple months.. He does use DuoNeb, which seems to help the best. He denies any hemoptysis, chest pain, orthopnea, PND, or increased edema  Past Medical History:  Diagnosis Date  . Arthritis   . COPD (chronic obstructive pulmonary disease) ([place])   . Diabetes mellitus without complication ([place])   . Dysphagia   . Emphysema   . Emphysema of lung ([place])   . Exertional shortness of breath   . GERD (gastroesophageal reflux disease)   . Heart murmur    as a child  . High cholesterol   . Hypertension    Current Outpatient Prescriptions on File Prior to Visit  Medication Sig Dispense Refill  . albuterol (PROVENTIL HFA;VENTOLIN HFA) 108 (90 BASE) MCG/ACT inhaler Inhale 2 puffs into the lungs every 6 (six) hours as needed for wheezing or shortness of breath. 1 Inhaler 11  . aspirin 325 MG tablet Take 325 mg by mouth daily.    . Cholecalciferol (D3 MAXIMUM STRENGTH) 5000 UNITS capsule Take  5,000 Units by mouth daily.    [name] donepezil (ARICEPT) 10 MG tablet Take 1 tablet daily 90 tablet 3  . HYDROcodone-homatropine (HYCODAN) 5-1.5 MG/5ML syrup Take 5 mLs by mouth every 6 (six) hours as needed for cough. 180 mL 0  . ipratropium-albuterol (DUONEB) 0.5-2.5 (3) MG/3ML SOLN INHALE CONTENTS OF 1 VIAL IN NEBULIZER EVERY 6 HOURS AS NEEDED. 90 mL 11  . metFORMIN (GLUCOPHAGE) 500 MG tablet Take 500 mg by mouth daily.  2  . nitroGLYCERIN (NITROSTAT) 0.4 MG SL tablet Place 0.4 mg under the tongue every 5 (five) minutes as needed for chest pain. Reported on 03/19/2016    . OXYGEN Inhale into the lungs. On 2 liters during the day and at night    . polyethylene glycol (MIRALAX / GLYCOLAX) packet Take 17 g by mouth daily.    . potassium chloride (K-DUR) 10 MEQ tablet Take 2 tablets (20 mEq total) by mouth daily. 10 tablet 0  . Potassium Gluconate 595 MG CAPS Take 1 capsule by mouth daily.    . predniSONE (DELTASONE) 10 MG tablet 3 tabs by mouth per day for 3 days,2tabs per day for 3 days,1tab per day for 3 days 18 tablet 0  . Respiratory Therapy Supplies (FLUTTER) DEVI Use as directed 1 each 0  . simvastatin (ZOCOR) 20 MG tablet TAKE ONE TABLET BY MOUTH DAILY AT 6 PM 90 tablet 0  . theophylline (UNIPHYL) 400  MG 24 hr tablet TAKE 1/2 TABLET BY MOUTH THREE TIMES DAILY 45 tablet 5  . ANORO ELLIPTA 62.5-25 MCG/INH AEPB INHALE ONE PUFF BY MOUTH DAILY (Patient not taking: Reported on 07/20/2016) 60 each 0  . Glycopyrrolate-Formoterol (BEVESPI AEROSPHERE) 9-4.8 MCG/ACT AERO Inhale 2 puffs into the lungs 2 (two) times daily. (Patient not taking: Reported on 07/20/2016) 1 Inhaler 0  . indapamide (LOZOL) 2.5 MG tablet Take 2.5 mg by mouth daily.     No current facility-administered medications on file prior to visit.      Review of Systems Constitutional:   No  weight loss, night sweats,  Fevers, chills,  +fatigue, or  lassitude.  HEENT:   No headaches,  Difficulty swallowing,  Tooth/dental problems, or   Sore throat,                No sneezing, itching, ear ache, nasal congestion, post nasal drip,   CV:  No chest pain,  Orthopnea, PND, swelling in lower extremities, anasarca, dizziness, palpitations, syncope.   GI  No heartburn, indigestion, abdominal pain, nausea, vomiting, diarrhea, change in bowel habits, loss of appetite, bloody stools.   Resp:   No chest wall deformity  Skin: no rash or lesions.  GU: no dysuria, change in color of urine, no urgency or frequency.  No flank pain, no hematuria   MS:  No joint pain or swelling.  No decreased range of motion.  No back pain.  Psych:  No change in mood or affect. No depression or anxiety.  No memory loss.         Objective:   Physical Exam  Vitals:   07/20/16 1205  BP: 100/62  Pulse: 67  Temp: 98.2 F (36.8 C)  TempSrc: Oral  SpO2: 97%  Weight: 177 lb (80.3 kg)  Height: 5\' 10"  (1.778 m)   GEN: A/Ox3; pleasant , NAD, elderly    HEENT:  [place]/AT,  EACs-clear, TMs-wnl, NOSE-clear, THROAT-clear, no lesions, no postnasal drip or exudate noted.   NECK:  Supple w/ fair ROM; no JVD; normal carotid impulses w/o bruits; no thyromegaly or nodules palpated; no lymphadenopathy.    RESP  Few trace rhonchi  no accessory muscle use, no dullness to percussion  CARD:  RRR, no m/r/g  , no peripheral edema, pulses intact, no cyanosis or clubbing.  GI:   Soft & nt; nml bowel sounds; no organomegaly or masses detected.   Musco: Warm bil, no deformities or joint swelling noted.   Neuro: alert, no focal deficits noted.    Skin: Warm, no lesions or rashes  Daja Shuping NP-C  [facility]  07/20/2016        Assessment & Plan:

## 2016-07-20 NOTE — Patient Instructions (Addendum)
Finish Levaquin and Prednisone .  Mucinex DM Twice daily  As needed  Cough/congestion  Use Duoneb As needed  For wheezing /shortness of breath .  Begin BREO 1 puff daily , rinse after use.  follow up Dr. Annamaria Boots  In 6 weeks and As needed   Please contact office for sooner follow up if symptoms do not improve or worsen or seek emergency care

## 2016-07-20 NOTE — Assessment & Plan Note (Signed)
Slow to resolve exacerbation   Plan  Patient Instructions  Finish Levaquin and Prednisone .  Mucinex DM Twice daily  As needed  Cough/congestion  Use Duoneb As needed  For wheezing /shortness of breath .  Begin BREO 1 puff daily , rinse after use.  follow up Dr. Annamaria Boots  In 6 weeks and As needed   Please contact office for sooner follow up if symptoms do not improve or worsen or seek emergency care

## 2016-07-24 ENCOUNTER — Other Ambulatory Visit: Payer: Self-pay | Admitting: Internal Medicine

## 2016-07-24 MED ORDER — POTASSIUM CHLORIDE ER 10 MEQ PO TBCR: 10.0000 meq | EXTENDED_RELEASE_TABLET | Freq: Every day | ORAL | 0 refills | Status: DC

## 2016-08-01 ENCOUNTER — Ambulatory Visit (INDEPENDENT_AMBULATORY_CARE_PROVIDER_SITE_OTHER): Payer: Medicare Other | Admitting: Family

## 2016-08-01 ENCOUNTER — Other Ambulatory Visit (INDEPENDENT_AMBULATORY_CARE_PROVIDER_SITE_OTHER): Payer: Medicare Other

## 2016-08-01 VITALS — BP 132/72 | HR 62 | Temp 98.5°F | Resp 16 | Ht 70.0 in | Wt 177.0 lb

## 2016-08-01 DIAGNOSIS — E876 Hypokalemia: Secondary | ICD-10-CM

## 2016-08-01 DIAGNOSIS — R350 Frequency of micturition: Secondary | ICD-10-CM | POA: Insufficient documentation

## 2016-08-01 LAB — BASIC METABOLIC PANEL
BUN: 13 mg/dL (ref 6–23)
CALCIUM: 10 mg/dL (ref 8.4–10.5)
CHLORIDE: 89 meq/L — AB (ref 96–112)
CO2: 36 meq/L — AB (ref 19–32)
CREATININE: 0.94 mg/dL (ref 0.40–1.50)
GFR: 81.96 mL/min (ref >60.00)
GLUCOSE: 105 mg/dL — AB (ref 70–99)
Potassium: 4.2 mEq/L (ref 3.5–5.1)
Sodium: 131 mEq/L — ABNORMAL LOW (ref 135–145)

## 2016-08-01 LAB — PSA: PSA: 3.65 ng/mL (ref 0.10–4.00)

## 2016-08-01 MED ORDER — TAMSULOSIN HCL 0.4 MG PO CAPS: 0.4000 mg | ORAL_CAPSULE | Freq: Every day | ORAL | 0 refills | Status: DC

## 2016-08-01 NOTE — Progress Notes (Signed)
Subjective:    Patient ID: Luis [name], male    DOB: Apr 21, 1934, 82 y.o.   MRN: [id number]  Chief Complaint  Patient presents with  . Follow-up    recent labs    HPI:  Luis [name] is a 82 y.o. male who  has a past medical history of Arthritis; COPD (chronic obstructive pulmonary disease) ([place]); Diabetes mellitus without complication ([place]); Dysphagia; Emphysema; Emphysema of lung ([place]); Exertional shortness of breath; GERD (gastroesophageal reflux disease); Heart murmur; High cholesterol; and Hypertension. and presents today for a follow up office visit.   1.) Hypokalemia - Previous noted to hypokalemia during recent lab work and was supplemented on potassium. He was on indapamide at the time which has since been held. A follow up lab draw showed a potassium level of 3.4. Denies any chest pain, shortness of breath or muscle spasms.  Lab Results  Component Value Date   K 3.4 (L) 07/20/2016   2.) Urination at night - This is a new problem. Associated symptom of urination at night going about 3-4 times per night has been going on for several months. Does not consume caffeine in the afternoons and denies fluid intake close to bed time. Denies dysuria, dribbling,or changes to his urinary stretam.   No Known Allergies    Outpatient Medications Prior to Visit  Medication Sig Dispense Refill  . albuterol (PROVENTIL HFA;VENTOLIN HFA) 108 (90 BASE) MCG/ACT inhaler Inhale 2 puffs into the lungs every 6 (six) hours as needed for wheezing or shortness of breath. 1 Inhaler 11  . aspirin 325 MG tablet Take 325 mg by mouth daily.    . Cholecalciferol (D3 MAXIMUM STRENGTH) 5000 UNITS capsule Take 5,000 Units by mouth daily.    [name] donepezil (ARICEPT) 10 MG tablet Take 1 tablet daily 90 tablet 3  . fluticasone furoate-vilanterol (BREO ELLIPTA) 100-25 MCG/INH AEPB Inhale 1 puff into the lungs daily. 1 each 5  . indapamide (LOZOL) 2.5 MG tablet Take 2.5 mg by mouth daily.    [name]  ipratropium-albuterol (DUONEB) 0.5-2.5 (3) MG/3ML SOLN INHALE CONTENTS OF 1 VIAL IN NEBULIZER EVERY 6 HOURS AS NEEDED. 90 mL 11  . metFORMIN (GLUCOPHAGE) 500 MG tablet Take 500 mg by mouth daily.  2  . nitroGLYCERIN (NITROSTAT) 0.4 MG SL tablet Place 0.4 mg under the tongue every 5 (five) minutes as needed for chest pain. Reported on 03/19/2016    . OXYGEN Inhale into the lungs. On 2 liters during the day and at night    . polyethylene glycol (MIRALAX / GLYCOLAX) packet Take 17 g by mouth daily.    . Potassium Gluconate 595 MG CAPS Take 1 capsule by mouth daily.    . predniSONE (DELTASONE) 10 MG tablet 3 tabs by mouth per day for 3 days,2tabs per day for 3 days,1tab per day for 3 days 18 tablet 0  . Respiratory Therapy Supplies (FLUTTER) DEVI Use as directed 1 each 0  . simvastatin (ZOCOR) 20 MG tablet TAKE ONE TABLET BY MOUTH DAILY AT 6 PM 90 tablet 0  . theophylline (UNIPHYL) 400 MG 24 hr tablet TAKE 1/2 TABLET BY MOUTH THREE TIMES DAILY 45 tablet 5  . potassium chloride (K-DUR) 10 MEQ tablet Take 1 tablet (10 mEq total) by mouth daily. 5 tablet 0   No facility-administered medications prior to visit.       Past Surgical History:  Procedure Laterality Date  . CATARACT EXTRACTION W/ INTRAOCULAR LENS IMPLANT Left 2011  . COLONOSCOPY N/A 01/03/2016  Procedure: COLONOSCOPY;  Surgeon: Aviva Signs, MD;  Location: [facility];  Service: Gastroenterology;  Laterality: N/A;  . ESOPHAGEAL DILATION  2000's   x2  . EYE SURGERY    . INGUINAL HERNIA REPAIR Right 1970's  . INGUINAL HERNIA REPAIR Bilateral 03/12/2013   w/mesh bilaterally/notes 03/12/2013  . INGUINAL HERNIA REPAIR Bilateral 03/12/2013   Procedure: HERNIA REPAIR INGUINAL ADULT BILATERAL;  Surgeon: Merrie Roof, MD;  Location: [facility];  Service: General;  Laterality: Bilateral;  . INSERTION OF MESH Bilateral 03/12/2013   Procedure: INSERTION OF MESH;  Surgeon: Merrie Roof, MD;  Location: [facility];  Service: General;  Laterality: Bilateral;   . RETINAL DETACHMENT SURGERY Right 08/29/2010  . TONSILLECTOMY  1940's  . TRANSURETHRAL RESECTION OF PROSTATE  2002; 2013      Past Medical History:  Diagnosis Date  . Arthritis   . COPD (chronic obstructive pulmonary disease) ([place])   . Diabetes mellitus without complication ([place])   . Dysphagia   . Emphysema   . Emphysema of lung ([place])   . Exertional shortness of breath   . GERD (gastroesophageal reflux disease)   . Heart murmur    as a child  . High cholesterol   . Hypertension       Review of Systems  Constitutional: Negative for chills and fever.  Respiratory: Negative for chest tightness and shortness of breath.   Cardiovascular: Negative for chest pain, palpitations and leg swelling.  Genitourinary: Positive for frequency. Negative for discharge, dysuria, flank pain, hematuria, testicular pain and urgency.  Neurological: Negative for weakness and numbness.      Objective:    BP 132/72 (BP Location: Left Arm, Patient Position: Sitting, Cuff Size: Normal)   Pulse 62   Temp 98.5 F (36.9 C) (Oral)   Resp 16   Ht 5\' 10"  (1.778 m)   Wt 177 lb (80.3 kg)   SpO2 93%   BMI 25.40 kg/m  Nursing note and vital signs reviewed.  Physical Exam  Constitutional: He is oriented to person, place, and time. He appears well-developed and well-nourished. No distress.  Cardiovascular: Normal rate, regular rhythm, normal heart sounds and intact distal pulses.   Pulmonary/Chest: Effort normal and breath sounds normal.  Neurological: He is alert and oriented to person, place, and time.  Skin: Skin is warm and dry.  Psychiatric: He has a normal mood and affect. His behavior is normal. Judgment and thought content normal.       Assessment & Plan:   Problem List Items Addressed This Visit      Other   Urinary frequency - Primary    Urinary frequency with concern for BPH versus overactive bladder. Seems to have more symptoms at night with improvement during the day. Obtain  PSA. Start Flomax. Advised to decrease caffeine and fluid intake closer to bed. Follow-up pending trial of Flomax and consider additional agents if symptoms do not improve with new medication regimen.      Relevant Medications   tamsulosin (FLOMAX) 0.4 MG CAPS capsule   Other Relevant Orders   PSA   Hypokalemia    Previously noted to have hypokalemia on recent blood work with mild improvement on follow-up. This may possibly be related to indapamide which has since been held. Obtain basic metabolic profile to check current potassium and electrolyte levels. Replete potassium if needed. Follow-up pending blood work results.      Relevant Orders   Basic Metabolic Panel (BMET)    Other Visit  Diagnoses   None.      I am having Mr. Caban start on tamsulosin. I am also having him maintain his polyethylene glycol, nitroGLYCERIN, Cholecalciferol, metFORMIN, albuterol, Potassium Gluconate, OXYGEN, aspirin, FLUTTER, theophylline, ipratropium-albuterol, indapamide, donepezil, predniSONE, fluticasone furoate-vilanterol, simvastatin, and potassium chloride.   Meds ordered this encounter  Medications  . tamsulosin (FLOMAX) 0.4 MG CAPS capsule    Sig: Take 1 capsule (0.4 mg total) by mouth daily.    Dispense:  30 capsule    Refill:  0    Order Specific Question:   Supervising Provider    Answer:   Pricilla Holm A [id number]     Follow-up: Return in about 1 month (around 09/01/2016), or if symptoms worsen or fail to improve.  Mauricio Po, FNP

## 2016-08-01 NOTE — Patient Instructions (Signed)
Thank you for choosing [facility].  SUMMARY AND INSTRUCTIONS:  Medication:  Your prescription(s) have been submitted to your pharmacy or been printed and provided for you. Please take as directed and contact our office if you believe you are having problem(s) with the medication(s) or have any questions.  Labs:  Please stop by the lab on the lower level of the building for your blood work. Your results will be released to [place] (or called to you) after review, usually within 72 hours after test completion. If any changes need to be made, you will be notified at that same time.  1.) The lab is open from 7:30am to 5:30 pm Monday-Friday 2.) No appointment is necessary 3.) Fasting (if needed) is 6-8 hours after food and drink; black coffee and water are okay   Follow up:  If your symptoms worsen or fail to improve, please contact our office for further instruction, or in case of emergency go directly to the emergency room at the closest medical facility.     Overactive Bladder, Adult Overactive bladder is a group of urinary symptoms. With overactive bladder, you may suddenly feel the need to pass urine (urinate) right away. After feeling this sudden urge, you might also leak urine if you cannot get to the bathroom fast enough (urinary incontinence). These symptoms might interfere with your daily work or social activities. Overactive bladder symptoms may also wake you up at night. Overactive bladder affects the nerve signals between your bladder and your brain. Your bladder may get the signal to empty before it is full. Very sensitive muscles can also make your bladder squeeze too soon. CAUSES Many things can cause an overactive bladder. Possible causes include:  Urinary tract infection.  Infection of nearby tissues, such as the prostate.  Prostate enlargement.  Being pregnant with twins or more (multiples).  Surgery on the uterus or urethra.  Bladder stones, inflammation,  or tumors.  Drinking too much caffeine or alcohol.  Certain medicines, especially those that you take to help your body get rid of extra fluid (diuretics) by increasing urine production.  Muscle or nerve weakness, especially from:  A spinal cord injury.  Stroke.  Multiple sclerosis.  Parkinson disease.  Diabetes. This can cause a high urine volume that fills the bladder so quickly that the normal urge to urinate is triggered very strongly.  Constipation. A buildup of too much stool can put pressure on your bladder. RISK FACTORS You may be at greater risk for overactive bladder if you:  Are an older adult.  Smoke.  Are going through menopause.  Have prostate problems.  Have a neurological disease, such as stroke, dementia, Parkinson disease, or multiple sclerosis (MS).  Eat or drink things that irritate the bladder. These include alcohol, spicy food, and caffeine.  Are overweight or obese. SIGNS AND SYMPTOMS  The signs and symptoms of an overactive bladder include:  Sudden, strong urges to urinate.  Leaking urine.  Urinating eight or more times per day.  Waking up to urinate two or more times per night. DIAGNOSIS Your health care provider may suspect overactive bladder based on your symptoms. The health care provider will do a physical exam and take your medical history. Blood or urine tests may also be done. For example, you might need to have a bladder function test to check how well you can hold your urine. You might also need to see a health care provider who specializes in the urinary tract (urologist). TREATMENT Treatment for overactive  bladder depends on the cause of your condition and whether it is mild or severe. Certain treatments can be done in your health care provider's office or clinic. You can also make lifestyle changes at home. Options include: Behavioral Treatments  Biofeedback. A specialist uses sensors to help you become aware of your body's  signals.  Keeping a daily log of when you need to urinate and what happens after the urge. This may help you manage your condition.  Bladder training. This helps you learn to control the urge to urinate by following a schedule that directs you to urinate at regular intervals (timed voiding). At first, you might have to wait a few minutes after feeling the urge. In time, you should be able to schedule bathroom visits an hour or more apart.  Kegel exercises. These are exercises to strengthen the pelvic floor muscles, which support the bladder. Toning these muscles can help you control urination, even if your bladder muscles are overactive. A specialist will teach you how to do these exercises correctly. They require daily practice.  Weight loss. If you are obese or overweight, losing weight might relieve your symptoms of overactive bladder. Talk to your health care provider about losing weight and whether there is a specific program or method that would work best for you.  Diet change. This might help if constipation is making your overactive bladder worse. Your health care provider or a dietitian can explain ways to change what you eat to ease constipation. You might also need to consume less alcohol and caffeine or drink other fluids at different times of the day.  Stopping smoking.  Wearing pads to absorb leakage while you wait for other treatments to take effect. Physical Treatments  Electrical stimulation. Electrodes send gentle pulses of electricity to strengthen the nerves or muscles that help to control the bladder. Sometimes, the electrodes are placed outside of the body. In other cases, they might be placed inside the body (implanted). This treatment can take several months to have an effect.  Supportive devices. Women may need a plastic device that fits into the vagina and supports the bladder (pessary). Medicines Several medicines can help treat overactive bladder and are usually used  along with other treatments. Some are injected into the muscles involved in urination. Others come in pill form. Your health care provider may prescribe:  Antispasmodics. These medicines block the signals that the nerves send to the bladder. This keeps the bladder from releasing urine at the wrong time.  Tricyclic antidepressants. These types of antidepressants also relax bladder muscles. Surgery  You may have a device implanted to help manage the nerve signals that indicate when you need to urinate.  You may have surgery to implant electrodes for electrical stimulation.  Sometimes, very severe cases of overactive bladder require surgery to change the shape of the bladder. HOME CARE INSTRUCTIONS   Take medicines only as directed by your health care provider.  Use any implants or a pessary as directed by your health care provider.  Make any diet or lifestyle changes that are recommended by your health care provider. These might include:  Drinking less fluid or drinking at different times of the day. If you need to urinate often during the night, you may need to stop drinking fluids early in the evening.  Cutting down on caffeine or alcohol. Both can make an overactive bladder worse. Caffeine is found in coffee, tea, and sodas.  Doing Kegel exercises to strengthen muscles.  Losing weight if  you need to.  Eating a healthy and balanced diet to prevent constipation.  Keep a journal or log to track how much and when you drink and also when you feel the need to urinate. This will help your health care provider to monitor your condition. SEEK MEDICAL CARE IF:  Your symptoms do not get better after treatment.  Your pain and discomfort are getting worse.  You have more frequent urges to urinate.  You have a fever. SEEK IMMEDIATE MEDICAL CARE IF: You are not able to control your bladder at all.   This information is not intended to replace advice given to you by your health care  provider. Make sure you discuss any questions you have with your health care provider.   Document Released: 07/21/2009 Document Revised: 10/15/2014 Document Reviewed: 02/17/2014 Elsevier Interactive Patient Education 2016 [employer]  Benign Prostatic Hyperplasia An enlarged prostate (benign prostatic hyperplasia) is common in older men. You may experience the following:  Weak urine stream.  Dribbling.  Feeling like the bladder has not emptied completely.  Difficulty starting urination.  Getting up frequently at night to urinate.  Urinating more frequently during the day. HOME CARE INSTRUCTIONS  Monitor your prostatic hyperplasia for any changes. The following actions may help to alleviate any discomfort you are experiencing:  Give yourself time when you urinate.  Stay away from alcohol.  Avoid beverages containing caffeine, such as coffee, tea, and colas, because they can make the problem worse.  Avoid decongestants, antihistamines, and some prescription medicines that can make the problem worse.  Follow up with your health care provider for further treatment as recommended. SEEK MEDICAL CARE IF:  You are experiencing progressive difficulty voiding.  Your urine stream is progressively getting narrower.  You are awaking from sleep with the urge to void more frequently.  You are constantly feeling the need to void.  You experience loss of urine, especially in small amounts. SEEK IMMEDIATE MEDICAL CARE IF:   You develop increased pain with urination or are unable to urinate.  You develop severe abdominal pain, vomiting, a high fever, or fainting.  You develop back pain or blood in your urine. MAKE SURE YOU:   Understand these instructions.  Will watch your condition.  Will get help right away if you are not doing well or get worse.   This information is not intended to replace advice given to you by your health care provider. Make sure you discuss any  questions you have with your health care provider.   Document Released: 09/24/2005 Document Revised: 10/15/2014 Document Reviewed: 02/24/2013 Elsevier Interactive Patient Education 2016 [employer].  Hypokalemia Hypokalemia means that the amount of potassium in the blood is lower than normal.Potassium is a chemical, called an electrolyte, that helps regulate the amount of fluid in the body. It also stimulates muscle contraction and helps nerves function properly.Most of the body's potassium is inside of cells, and only a very small amount is in the blood. Because the amount in the blood is so small, minor changes can be life-threatening. CAUSES  Antibiotics.  Diarrhea or vomiting.  Using laxatives too much, which can cause diarrhea.  Chronic kidney disease.  Water pills (diuretics).  Eating disorders (bulimia).  Low magnesium level.  Sweating a lot. SIGNS AND SYMPTOMS  Weakness.  Constipation.  Fatigue.  Muscle cramps.  Mental confusion.  Skipped heartbeats or irregular heartbeat (palpitations).  Tingling or numbness. DIAGNOSIS  Your health care provider can diagnose hypokalemia with blood tests. In addition to  checking your potassium level, your health care provider may also check other lab tests. TREATMENT Hypokalemia can be treated with potassium supplements taken by mouth or adjustments in your current medicines. If your potassium level is very low, you may need to get potassium through a vein (IV) and be monitored in the hospital. A diet high in potassium is also helpful. Foods high in potassium are:  Nuts, such as peanuts and pistachios.  Seeds, such as sunflower seeds and pumpkin seeds.  Peas, lentils, and lima beans.  Whole grain and bran cereals and breads.  Fresh fruit and vegetables, such as apricots, avocado, bananas, cantaloupe, kiwi, oranges, tomatoes, asparagus, and potatoes.  Orange and tomato juices.  Red meats.  Fruit yogurt. HOME CARE  INSTRUCTIONS  Take all medicines as prescribed by your health care provider.  Maintain a healthy diet by including nutritious food, such as fruits, vegetables, nuts, whole grains, and lean meats.  If you are taking a laxative, be sure to follow the directions on the label. SEEK MEDICAL CARE IF:  Your weakness gets worse.  You feel your heart pounding or racing.  You are vomiting or having diarrhea.  You are diabetic and having trouble keeping your blood glucose in the normal range. SEEK IMMEDIATE MEDICAL CARE IF:  You have chest pain, shortness of breath, or dizziness.  You are vomiting or having diarrhea for more than 2 days.  You faint. MAKE SURE YOU:   Understand these instructions.  Will watch your condition.  Will get help right away if you are not doing well or get worse.   This information is not intended to replace advice given to you by your health care provider. Make sure you discuss any questions you have with your health care provider.   Document Released: 09/24/2005 Document Revised: 10/15/2014 Document Reviewed: 03/27/2013 Elsevier Interactive Patient Education [employer].

## 2016-08-01 NOTE — Assessment & Plan Note (Signed)
Urinary frequency with concern for BPH versus overactive bladder. Seems to have more symptoms at night with improvement during the day. Obtain PSA. Start Flomax. Advised to decrease caffeine and fluid intake closer to bed. Follow-up pending trial of Flomax and consider additional agents if symptoms do not improve with new medication regimen.

## 2016-08-01 NOTE — Assessment & Plan Note (Signed)
Previously noted to have hypokalemia on recent blood work with mild improvement on follow-up. This may possibly be related to indapamide which has since been held. Obtain basic metabolic profile to check current potassium and electrolyte levels. Replete potassium if needed. Follow-up pending blood work results.

## 2016-08-02 ENCOUNTER — Encounter: Payer: Self-pay | Admitting: Family

## 2016-08-14 ENCOUNTER — Other Ambulatory Visit: Payer: Self-pay | Admitting: Family

## 2016-08-15 ENCOUNTER — Telehealth: Payer: Self-pay | Admitting: Family

## 2016-08-15 NOTE — Telephone Encounter (Signed)
States patient has been having nose bleeds.  He had one two or three days ago and then he had twice today.  States patient usually has one per week.  Would like to know if Luis [name] thought this could be from his oxygen.  Should patient make appt?

## 2016-08-15 NOTE — Telephone Encounter (Signed)
Yes this certainly could be from the oxygen as it may be drying his nose. I would recommend using a saline nasal spray like Simple Saline. There is also a saline gel which can be used. If this does not help than I would recommend an office visit.

## 2016-08-16 NOTE — Telephone Encounter (Signed)
Notified spouse  °

## 2016-08-21 ENCOUNTER — Encounter (HOSPITAL_COMMUNITY): Payer: Self-pay | Admitting: Emergency Medicine

## 2016-08-21 ENCOUNTER — Emergency Department (HOSPITAL_COMMUNITY)
Admission: EM | Admit: 2016-08-21 | Discharge: 2016-08-21 | Disposition: A | Discharge status: Home / Self Care | Payer: Medicare Other | Attending: Emergency Medicine | Admitting: Emergency Medicine

## 2016-08-21 DIAGNOSIS — Z79899 Other long term (current) drug therapy: Secondary | ICD-10-CM | POA: Insufficient documentation

## 2016-08-21 DIAGNOSIS — Z7984 Long term (current) use of oral hypoglycemic drugs: Secondary | ICD-10-CM | POA: Insufficient documentation

## 2016-08-21 DIAGNOSIS — Z87891 Personal history of nicotine dependence: Secondary | ICD-10-CM | POA: Insufficient documentation

## 2016-08-21 DIAGNOSIS — I10 Essential (primary) hypertension: Secondary | ICD-10-CM | POA: Diagnosis not present

## 2016-08-21 DIAGNOSIS — Z7982 Long term (current) use of aspirin: Secondary | ICD-10-CM | POA: Diagnosis not present

## 2016-08-21 DIAGNOSIS — R040 Epistaxis: Secondary | ICD-10-CM | POA: Diagnosis present

## 2016-08-21 DIAGNOSIS — E119 Type 2 diabetes mellitus without complications: Secondary | ICD-10-CM | POA: Diagnosis not present

## 2016-08-21 DIAGNOSIS — J449 Chronic obstructive pulmonary disease, unspecified: Secondary | ICD-10-CM | POA: Diagnosis not present

## 2016-08-21 MED ORDER — CEPHALEXIN 500 MG PO CAPS: 500.0000 mg | ORAL_CAPSULE | Freq: Two times a day (BID) | ORAL | 0 refills | Status: AC

## 2016-08-21 MED ORDER — SILVER NITRATE-POT NITRATE 75-25 % EX MISC
CUTANEOUS | Status: AC
  Filled 2016-08-21: qty 1

## 2016-08-21 MED ORDER — LIDOCAINE HCL 4 % EX SOLN: 0.0000 mL | Freq: Once | CUTANEOUS | Status: DC | PRN

## 2016-08-21 MED ORDER — LIDOCAINE HCL 2 % EX GEL
CUTANEOUS | Status: DC
Start: 2016-08-21 — End: 2016-08-21
  Filled 2016-08-21: qty 10

## 2016-08-21 MED ORDER — LIDOCAINE-EPINEPHRINE (PF) 1 %-1:200000 IJ SOLN
0.0000 mL | Freq: Once | INTRAMUSCULAR | Status: AC | PRN
  Administered 2016-08-21: 30 mL via INTRADERMAL

## 2016-08-21 MED ORDER — SILVER NITRATE-POT NITRATE 75-25 % EX MISC
1.0000 | Freq: Once | CUTANEOUS | Status: AC | PRN
  Administered 2016-08-21: 1 via TOPICAL

## 2016-08-21 MED ORDER — OXYMETAZOLINE HCL 0.05 % NA SOLN: 1.0000 | Freq: Once | NASAL | Status: DC

## 2016-08-21 MED ORDER — BACITRACIN-NEOMYCIN-POLYMYXIN 400-5-5000 EX OINT
TOPICAL_OINTMENT | CUTANEOUS | Status: AC
  Filled 2016-08-21: qty 1

## 2016-08-21 MED ORDER — OXYMETAZOLINE HCL 0.05 % NA SOLN
NASAL | Status: AC
  Filled 2016-08-21: qty 15

## 2016-08-21 MED ORDER — LIDOCAINE-EPINEPHRINE (PF) 1 %-1:200000 IJ SOLN
INTRAMUSCULAR | Status: AC
  Filled 2016-08-21: qty 30

## 2016-08-21 MED ORDER — TRIPLE ANTIBIOTIC 3.5-400-5000 EX OINT: 1.0000 "application " | TOPICAL_OINTMENT | Freq: Once | CUTANEOUS | Status: DC | PRN

## 2016-08-21 MED ORDER — OXYMETAZOLINE HCL 0.05 % NA SOLN: 1.0000 | Freq: Once | NASAL | Status: DC | PRN

## 2016-08-21 MED ORDER — LIDOCAINE HCL 2 % EX GEL: 1.0000 "application " | Freq: Once | CUTANEOUS | Status: DC | PRN

## 2016-08-21 NOTE — ED Provider Notes (Signed)
[name] DEPT Provider Note   CSN: [id number] Arrival date & time: 08/21/16  1124   History   Chief Complaint Chief Complaint  Patient presents with  . Epistaxis    HPI   Luis [name] is a 82 y.o. male with PMH of COPD, DM, GERD, HLD, HTN who presents with a nose bleed since last night. The bleeding is constant. He has gone through many napkins and tissues but the bleeding persists. He takes a full dose of aspirin daily but not other forms of anticoagulation. His wife notes that he bruises easily and he has had nose bleeds in the past. Of note he does use oxygen via nasal cannula every night for the last year. Denies any fevers, chills, or cough.   Past Medical History:  Diagnosis Date  . Arthritis   . COPD (chronic obstructive pulmonary disease) ([place])   . Diabetes mellitus without complication ([place])   . Dysphagia   . Emphysema   . Emphysema of lung ([place])   . Exertional shortness of breath   . GERD (gastroesophageal reflux disease)   . Heart murmur    as a child  . High cholesterol   . Hypertension     Patient Active Problem List   Diagnosis Date Noted  . Urinary frequency 08/01/2016  . Hypokalemia 08/01/2016  . Cough 07/13/2016  . General weakness 07/13/2016  . Loss of weight 04/18/2016  . History of stroke 12/29/2015  . Lower GI bleed 12/19/2015  . GI bleed 12/19/2015  . Mild cognitive impairment 07/26/2015  . Cerebrovascular accident (CVA) due to thrombosis of left carotid artery ([place]) 07/26/2015  . Hyperlipidemia 07/26/2015  . Cerebrovascular accident (CVA) ([place])   . Acute encephalopathy   . Altered mental status 07/15/2015  . COPD exacerbation ([place]) 07/15/2015  . Hyponatremia 07/15/2015  . Essential hypertension 07/15/2015  . CVA (cerebral vascular accident) ([place]) 07/15/2015  . Chronic respiratory failure with hypoxia ([place]) 07/15/2015  . Diabetes mellitus ([place]) 07/15/2015  . Bilateral inguinal hernia 03/04/2013  . Vasomotor rhinitis 11/29/2012    . COPD mixed type ([place]) 11/08/2012  . Anosmia 11/08/2012    Past Surgical History:  Procedure Laterality Date  . CATARACT EXTRACTION W/ INTRAOCULAR LENS IMPLANT Left 2011  . COLONOSCOPY N/A 01/03/2016   Procedure: COLONOSCOPY;  Surgeon: Aviva Signs, MD;  [facility];  Service: Gastroenterology;  Laterality: N/A;  . ESOPHAGEAL DILATION  2000's   x2  . EYE SURGERY    . INGUINAL HERNIA REPAIR Right 1970's  . INGUINAL HERNIA REPAIR Bilateral 03/12/2013   w/mesh bilaterally/notes 03/12/2013  . INGUINAL HERNIA REPAIR Bilateral 03/12/2013   Procedure: HERNIA REPAIR INGUINAL ADULT BILATERAL;  Surgeon: Merrie Roof, MD;  Location: [facility];  Service: General;  Laterality: Bilateral;  . INSERTION OF MESH Bilateral 03/12/2013   Procedure: INSERTION OF MESH;  Surgeon: Merrie Roof, MD;  Location: [facility];  Service: General;  Laterality: Bilateral;  . RETINAL DETACHMENT SURGERY Right 08/29/2010  . TONSILLECTOMY  1940's  . TRANSURETHRAL RESECTION OF PROSTATE  2002; 2013       Home Medications    Prior to Admission medications   Medication Sig Start Date End Date Taking? Authorizing Provider  albuterol (PROVENTIL HFA;VENTOLIN HFA) 108 (90 BASE) MCG/ACT inhaler Inhale 2 puffs into the lungs every 6 (six) hours as needed for wheezing or shortness of breath. 09/27/15   Deneise Lever, MD  aspirin 325 MG tablet Take 325 mg by mouth daily.  Historical Provider, MD  cephALEXin (KEFLEX) 500 MG capsule Take 1 capsule (500 mg total) by mouth 2 (two) times daily. 08/21/16 08/28/16  Carlyle Dolly, MD  Cholecalciferol (D3 MAXIMUM STRENGTH) 5000 UNITS capsule Take 5,000 Units by mouth daily.    Historical Provider, MD  donepezil (ARICEPT) 10 MG tablet Take 1 tablet daily 07/11/16   Cameron Sprang, MD  fluticasone furoate-vilanterol (BREO ELLIPTA) 100-25 MCG/INH AEPB Inhale 1 puff into the lungs daily. 07/20/16   Tammy S Parrett, NP  indapamide (LOZOL) 2.5 MG tablet Take 2.5 mg by mouth daily.     Historical Provider, MD  ipratropium-albuterol (DUONEB) 0.5-2.5 (3) MG/3ML SOLN INHALE CONTENTS OF 1 VIAL IN NEBULIZER EVERY 6 HOURS AS NEEDED. 07/10/16   Deneise Lever, MD  metFORMIN (GLUCOPHAGE) 500 MG tablet Take 500 mg by mouth daily. 05/23/15   Historical Provider, MD  nitroGLYCERIN (NITROSTAT) 0.4 MG SL tablet Place 0.4 mg under the tongue every 5 (five) minutes as needed for chest pain. Reported on 03/19/2016    Historical Provider, MD  OXYGEN Inhale into the lungs. On 2 liters during the day and at night    Historical Provider, MD  polyethylene glycol (MIRALAX / GLYCOLAX) packet Take 17 g by mouth daily.    Historical Provider, MD  polyethylene glycol powder (GLYCOLAX/MIRALAX) powder TAKE 17 GRAMS (1 CAPFUL) IN 8 OZ OF FLUID ONCE DAILY 08/15/16   Golden Circle, FNP  potassium chloride (K-DUR) 10 MEQ tablet Take 1 tablet (10 mEq total) by mouth daily. 07/24/16 07/29/16  Biagio Borg, MD  Potassium Gluconate 595 MG CAPS Take 1 capsule by mouth daily.    Historical Provider, MD  predniSONE (DELTASONE) 10 MG tablet 3 tabs by mouth per day for 3 days,2tabs per day for 3 days,1tab per day for 3 days 07/13/16   Biagio Borg, MD  Respiratory Therapy Supplies (FLUTTER) DEVI Use as directed 04/18/16   Deneise Lever, MD  simvastatin (ZOCOR) 20 MG tablet TAKE ONE TABLET BY MOUTH DAILY AT 6 PM 07/23/16   Golden Circle, FNP  tamsulosin (FLOMAX) 0.4 MG CAPS capsule Take 1 capsule (0.4 mg total) by mouth daily. 08/01/16   Golden Circle, FNP  theophylline (UNIPHYL) 400 MG 24 hr tablet TAKE 1/2 TABLET BY MOUTH THREE TIMES DAILY 04/25/16   Deneise Lever, MD    Family History Family History  Problem Relation Age of Onset  . Stroke Mother   . Healthy Father     Social History Social History  Substance Use Topics  . Smoking status: Former Smoker    Packs/day: 2.00    Years: 35.00    Types: Cigarettes    Quit date: 10/08/1985  . Smokeless tobacco: Former Systems developer    Types: Chew    Quit date:  10/08/1997  . Alcohol use No     Allergies   Patient has no known allergies.   Review of Systems Review of Systems  10 Systems reviewed and are negative for acute change except as noted in the HPI.   Physical Exam Updated Vital Signs BP 180/97 (BP Location: Left Arm)   Pulse 61   Temp 99.1 F (37.3 C) (Oral)   Resp 20   Ht 5\' 10"  (1.778 m)   Wt 80.7 kg   SpO2 97%   BMI 25.54 kg/m   Physical Exam  Constitutional: He is oriented to person, place, and time. He appears well-developed and well-nourished.  HENT:  Head: Normocephalic and atraumatic.  Right Ear: External ear normal.  Left Ear: External ear normal.  Nose: No mucosal edema, sinus tenderness, nasal deformity or nasal septal hematoma. Epistaxis (left nares) is observed.  No foreign bodies. Right sinus exhibits no maxillary sinus tenderness and no frontal sinus tenderness. Left sinus exhibits no maxillary sinus tenderness and no frontal sinus tenderness.  Mouth/Throat: Oropharynx is clear and moist.  Small bleeding lesion on left septum  Eyes: Conjunctivae and EOM are normal. Pupils are equal, round, and reactive to light.  Neck: Normal range of motion.  Cardiovascular: Normal rate, regular rhythm, normal heart sounds and intact distal pulses.   Pulmonary/Chest: Effort normal and breath sounds normal. No respiratory distress.  Abdominal: Soft. Bowel sounds are normal. He exhibits no distension.  Musculoskeletal: Normal range of motion. He exhibits no edema.  Neurological: He is alert and oriented to person, place, and time. No sensory deficit. He exhibits normal muscle tone.  Skin: Skin is warm and dry. Capillary refill takes less than 2 seconds.  Psychiatric: He has a normal mood and affect. His behavior is normal.    ED Treatments / Results  Labs (all labs ordered are listed, but only abnormal results are displayed) Labs Reviewed - No data to display  EKG  EKG Interpretation None       Radiology No  results found.  Procedures .Epistaxis Management Date/Time: 08/21/2016 12:52 PM Performed by: Carlyle Dolly Authorized by: Virgel Manifold   Consent:    Consent obtained:  Verbal   Consent given by:  Patient   Risks discussed:  Bleeding and pain Anesthesia (see MAR for exact dosages):    Anesthesia method:  Topical application   Topical anesthetic:  Lidocaine gel Procedure details:    Treatment site:  L septum   Treatment method:  Silver nitrate   Treatment complexity:  Limited   Treatment episode: initial   Post-procedure details:    Assessment:  Bleeding stopped   Patient tolerance of procedure:  Tolerated well, no immediate complications   (including critical care time)  Medications Ordered in ED Medications  lidocaine (XYLOCAINE) 2 % jelly 1 application (not administered)  oxymetazoline (AFRIN) 0.05 % nasal spray 1 spray (not administered)  TRIPLE ANTIBIOTIC XX123456 OINT 1 application (not administered)  oxymetazoline (AFRIN) 0.05 % nasal spray (not administered)  neomycin-bacitracin-polymyxin (NEOSPORIN) 400-02-4999 ointment (not administered)  oxymetazoline (AFRIN) 0.05 % nasal spray 1 spray (not administered)  lidocaine-EPINEPHrine (XYLOCAINE-EPINEPHrine) 1 %-1:200000 (PF) injection 0-30 mL (30 mLs Intradermal Given by Other 08/21/16 1336)  silver nitrate applicators applicator 1 Stick (1 Stick Topical Given 08/21/16 1336)     Initial Impression / Assessment and Plan / ED Course  I have reviewed the triage vital signs and the nursing notes.  Pertinent labs & imaging results that were available during my care of the patient were reviewed by me and considered in my medical decision making (see chart for details).  Clinical Course    RYKKER JASA is a 82 y.o. male with PMH of COPD, DM, GERD, HLD, HTN who presents with a nose bleed since last night. Vital signs stable and patient afebrile. Physical exam notable for epistaxis in left nare stemming from  lesion on left septum. Bleeding likely secondary to dry air from nasal cannula at home. Epistaxis was cleaned and treated as per procedure note above. Nasal packing was placed on the left side. Patient instructed to follow up with ENT and given prescription for Keflex. Stable for discharge.   Final Clinical Impressions(s) / ED  Diagnoses   Final diagnoses:  Epistaxis    New Prescriptions Discharge Medication List as of 08/21/2016  2:23 PM    START taking these medications   Details  cephALEXin (KEFLEX) 500 MG capsule Take 1 capsule (500 mg total) by mouth 2 (two) times daily., Starting Tue 08/21/2016, Until Tue 08/28/2016, Print         Carlyle Dolly, MD 08/21/16 1436    Virgel Manifold, MD 08/25/16 1039

## 2016-08-21 NOTE — ED Triage Notes (Signed)
Intermittent nosebleeds x 2 weeks. Pt has nosebleed that started this am and has not been able to stop.

## 2016-08-21 NOTE — ED Notes (Signed)
Pt reports he has had a nose bleed since this morning and has been unable to stop it. Pt has not been holding constant pressure, bld is oozing from L nare.

## 2016-08-21 NOTE — Discharge Instructions (Signed)
You were seen today for a nose bleed. Your bleed was stopped with some medications and a nose packing. We have given you a prescription for antibiotics to take to avoid infection. Please call the ear, nose, and throat doctor listed above (Dr. Constance Holster) for an appointment. Please come back if your nose bleed does not resolve or you develop shortness of breath or fever. Take care!

## 2016-09-03 ENCOUNTER — Ambulatory Visit (INDEPENDENT_AMBULATORY_CARE_PROVIDER_SITE_OTHER): Payer: Medicare Other | Admitting: Adult Health

## 2016-09-03 ENCOUNTER — Encounter: Payer: Self-pay | Admitting: Adult Health

## 2016-09-03 DIAGNOSIS — J449 Chronic obstructive pulmonary disease, unspecified: Secondary | ICD-10-CM | POA: Diagnosis not present

## 2016-09-03 DIAGNOSIS — J9611 Chronic respiratory failure with hypoxia: Secondary | ICD-10-CM | POA: Diagnosis not present

## 2016-09-03 MED ORDER — FLUTICASONE FUROATE-VILANTEROL 100-25 MCG/INH IN AEPB: 1.0000 | INHALATION_SPRAY | Freq: Every day | RESPIRATORY_TRACT | 5 refills | Status: DC

## 2016-09-03 NOTE — Assessment & Plan Note (Signed)
Recent flare now resolved Doing well on BREO .   Plan  Patient Instructions  Mucinex DM Twice daily  And flutter valve As needed  Cough/congestion  Use Duoneb As needed  For wheezing /shortness of breath .  Continue on BREO 1 puff daily , rinse after use.  Follow up Dr. Annamaria Boots  In 3 -4 months and As needed   Please contact office for sooner follow up if symptoms do not improve or worsen or seek emergency care

## 2016-09-03 NOTE — Patient Instructions (Addendum)
Mucinex DM Twice daily  And flutter valve As needed  Cough/congestion  Use Duoneb As needed  For wheezing /shortness of breath .  Continue on BREO 1 puff daily , rinse after use.  Follow up Dr. Annamaria Boots  In 3 -4 months and As needed   Please contact office for sooner follow up if symptoms do not improve or worsen or seek emergency care

## 2016-09-03 NOTE — Progress Notes (Signed)
Subjective:    Patient ID: Luis [name], male    DOB: 05-21-1934, 82 y.o.   MRN: [id number]  HPI 82 yo former smoker followed for severe COPD /emphysema on O2   09/03/2016 Follow up : COPD  Pt returns for a 6 week follow up . Seen last ov with slow to resolve COPD exacerbation . He is feeling much better with decreased cough and congestion. He was treated with abx and steroids . Started on BREO daily.  He denies any hemoptysis, chest pain, orthopnea, PND, or increased edema  Past Medical History:  Diagnosis Date  . Arthritis   . COPD (chronic obstructive pulmonary disease) ([place])   . Diabetes mellitus without complication ([place])   . Dysphagia   . Emphysema   . Emphysema of lung ([place])   . Exertional shortness of breath   . GERD (gastroesophageal reflux disease)   . Heart murmur    as a child  . High cholesterol   . Hypertension    Current Outpatient Prescriptions on File Prior to Visit  Medication Sig Dispense Refill  . albuterol (PROVENTIL HFA;VENTOLIN HFA) 108 (90 BASE) MCG/ACT inhaler Inhale 2 puffs into the lungs every 6 (six) hours as needed for wheezing or shortness of breath. 1 Inhaler 11  . aspirin 325 MG tablet Take 325 mg by mouth daily.    . Cholecalciferol (D3 MAXIMUM STRENGTH) 5000 UNITS capsule Take 5,000 Units by mouth daily.    [name] donepezil (ARICEPT) 10 MG tablet Take 1 tablet daily 90 tablet 3  . fluticasone furoate-vilanterol (BREO ELLIPTA) 100-25 MCG/INH AEPB Inhale 1 puff into the lungs daily. 1 each 5  . ipratropium-albuterol (DUONEB) 0.5-2.5 (3) MG/3ML SOLN INHALE CONTENTS OF 1 VIAL IN NEBULIZER EVERY 6 HOURS AS NEEDED. 90 mL 11  . metFORMIN (GLUCOPHAGE) 500 MG tablet Take 500 mg by mouth daily.  2  . nitroGLYCERIN (NITROSTAT) 0.4 MG SL tablet Place 0.4 mg under the tongue every 5 (five) minutes as needed for chest pain. Reported on 03/19/2016    . OXYGEN Inhale into the lungs. On 2 liters during the day and at night    . polyethylene glycol (MIRALAX /  GLYCOLAX) packet Take 17 g by mouth daily.    [name] Respiratory Therapy Supplies (FLUTTER) DEVI Use as directed 1 each 0  . simvastatin (ZOCOR) 20 MG tablet TAKE ONE TABLET BY MOUTH DAILY AT 6 PM 90 tablet 0  . tamsulosin (FLOMAX) 0.4 MG CAPS capsule Take 1 capsule (0.4 mg total) by mouth daily. 30 capsule 0  . theophylline (UNIPHYL) 400 MG 24 hr tablet TAKE 1/2 TABLET BY MOUTH THREE TIMES DAILY 45 tablet 5  . indapamide (LOZOL) 2.5 MG tablet Take 2.5 mg by mouth daily.    . potassium chloride (K-DUR) 10 MEQ tablet Take 1 tablet (10 mEq total) by mouth daily. 5 tablet 0  . Potassium Gluconate 595 MG CAPS Take 1 capsule by mouth daily.     No current facility-administered medications on file prior to visit.      Review of Systems Constitutional:   No  weight loss, night sweats,  Fevers, chills,  +fatigue, or  lassitude.  HEENT:   No headaches,  Difficulty swallowing,  Tooth/dental problems, or  Sore throat,                No sneezing, itching, ear ache, nasal congestion, post nasal drip,   CV:  No chest pain,  Orthopnea, PND, swelling in lower extremities, anasarca, dizziness, palpitations,  syncope.   GI  No heartburn, indigestion, abdominal pain, nausea, vomiting, diarrhea, change in bowel habits, loss of appetite, bloody stools.   Resp:   No chest wall deformity  Skin: no rash or lesions.  GU: no dysuria, change in color of urine, no urgency or frequency.  No flank pain, no hematuria   MS:  No joint pain or swelling.  No decreased range of motion.  No back pain.  Psych:  No change in mood or affect. No depression or anxiety.  No memory loss.         Objective:   Physical Exam  Vitals:   09/03/16 1206  BP: 140/62  Pulse: (!) 52  Temp: 97.7 F (36.5 C)  TempSrc: Oral  SpO2: 96%  Weight: 178 lb (80.7 kg)  Height: 5\' 11"  (1.803 m)   GEN: A/Ox3; pleasant , NAD, elderly    HEENT:  [place]/AT,  EACs-clear, TMs-wnl, NOSE-clear, THROAT-clear, no lesions, no postnasal drip or  exudate noted.   NECK:  Supple w/ fair ROM; no JVD; normal carotid impulses w/o bruits; no thyromegaly or nodules palpated; no lymphadenopathy.    RESP  Decreased BS in bases , w/ no wheezing   no accessory muscle use, no dullness to percussion  CARD:  RRR, no m/r/g  , no peripheral edema, pulses intact, no cyanosis or clubbing.  GI:   Soft & nt; nml bowel sounds; no organomegaly or masses detected.   Musco: Warm bil, no deformities or joint swelling noted.   Neuro: alert, no focal deficits noted.    Skin: Warm, no lesions or rashes  Voncile Schwarz NP-C  [facility]  09/03/2016        Assessment & Plan:

## 2016-09-03 NOTE — Assessment & Plan Note (Signed)
Cont on O2 At bedtime   

## 2016-09-09 ENCOUNTER — Encounter (HOSPITAL_COMMUNITY): Payer: Self-pay | Admitting: Emergency Medicine

## 2016-09-09 ENCOUNTER — Emergency Department (HOSPITAL_COMMUNITY)
Admission: EM | Admit: 2016-09-09 | Discharge: 2016-09-09 | Disposition: A | Discharge status: Home / Self Care | Payer: Medicare Other | Attending: Emergency Medicine | Admitting: Emergency Medicine

## 2016-09-09 DIAGNOSIS — I10 Essential (primary) hypertension: Secondary | ICD-10-CM | POA: Insufficient documentation

## 2016-09-09 DIAGNOSIS — Z7984 Long term (current) use of oral hypoglycemic drugs: Secondary | ICD-10-CM | POA: Insufficient documentation

## 2016-09-09 DIAGNOSIS — Z87891 Personal history of nicotine dependence: Secondary | ICD-10-CM | POA: Diagnosis not present

## 2016-09-09 DIAGNOSIS — E119 Type 2 diabetes mellitus without complications: Secondary | ICD-10-CM | POA: Insufficient documentation

## 2016-09-09 DIAGNOSIS — Z7982 Long term (current) use of aspirin: Secondary | ICD-10-CM | POA: Diagnosis not present

## 2016-09-09 DIAGNOSIS — J449 Chronic obstructive pulmonary disease, unspecified: Secondary | ICD-10-CM | POA: Insufficient documentation

## 2016-09-09 DIAGNOSIS — R040 Epistaxis: Secondary | ICD-10-CM | POA: Diagnosis not present

## 2016-09-09 DIAGNOSIS — Z79899 Other long term (current) drug therapy: Secondary | ICD-10-CM | POA: Diagnosis not present

## 2016-09-09 MED ORDER — SILVER NITRATE-POT NITRATE 75-25 % EX MISC
CUTANEOUS | Status: AC
  Filled 2016-09-09: qty 1

## 2016-09-09 MED ORDER — OXYMETAZOLINE HCL 0.05 % NA SOLN
1.0000 | Freq: Once | NASAL | Status: AC
  Administered 2016-09-09: 1 via NASAL
  Filled 2016-09-09: qty 15

## 2016-09-09 MED ORDER — CEPHALEXIN 500 MG PO CAPS: 500.0000 mg | ORAL_CAPSULE | Freq: Four times a day (QID) | ORAL | 0 refills | Status: DC

## 2016-09-09 MED ORDER — TRANEXAMIC ACID 1000 MG/10ML IV SOLN
500.0000 mg | Freq: Once | INTRAVENOUS | Status: AC
  Administered 2016-09-09: 500 mg via TOPICAL
  Filled 2016-09-09: qty 10

## 2016-09-09 MED ORDER — CEPHALEXIN 500 MG PO CAPS
500.0000 mg | ORAL_CAPSULE | Freq: Once | ORAL | Status: AC
  Administered 2016-09-09: 500 mg via ORAL
  Filled 2016-09-09: qty 1

## 2016-09-09 MED ORDER — IPRATROPIUM-ALBUTEROL 0.5-2.5 (3) MG/3ML IN SOLN
3.0000 mL | Freq: Once | RESPIRATORY_TRACT | Status: AC
  Administered 2016-09-09: 3 mL via RESPIRATORY_TRACT
  Filled 2016-09-09: qty 3

## 2016-09-09 MED ORDER — HYDROCODONE-ACETAMINOPHEN 5-325 MG PO TABS
1.0000 | ORAL_TABLET | Freq: Once | ORAL | Status: AC
  Administered 2016-09-09: 1 via ORAL
  Filled 2016-09-09: qty 1

## 2016-09-09 NOTE — ED Notes (Addendum)
Upon getting to the lobby to leave the ED the pt began to have some bleeding from the left nostril again. Pt brought back to room 1. Pt with the gauze he was given upon discharge. Minimal blood noted on the gauze. Pt states he did apply direct pressure x 5 min with no relief. Upon returning to the room, very minimal bleeding noted. MD notified of pt return

## 2016-09-09 NOTE — ED Triage Notes (Signed)
Left side nosebleed x 3 hours. Pt reports third episode today.

## 2016-09-09 NOTE — ED Provider Notes (Addendum)
[facility] DEPT Provider Note   CSN: [id number] Arrival date & time: 09/09/16  1531     History   Chief Complaint Chief Complaint  Patient presents with  . Epistaxis    HPI Luis [name] is a 82 y.o. male.  HPI Pt comes in with cc of epistaxis. Pt is on asprin. He had epistaxis few days ago and required packing at that time. Pt started having spontaneous nose bleeds today, and the 3rd episode has persisted. Pt is passing clots. No dizziness.  Past Medical History:  Diagnosis Date  . Arthritis   . COPD (chronic obstructive pulmonary disease) ([place])   . Diabetes mellitus without complication ([place])   . Dysphagia   . Emphysema   . Emphysema of lung ([place])   . Exertional shortness of breath   . GERD (gastroesophageal reflux disease)   . Heart murmur    as a child  . High cholesterol   . Hypertension     Patient Active Problem List   Diagnosis Date Noted  . Urinary frequency 08/01/2016  . Hypokalemia 08/01/2016  . Cough 07/13/2016  . [name] weakness 07/13/2016  . Loss of weight 04/18/2016  . History of stroke 12/29/2015  . Lower GI bleed 12/19/2015  . GI bleed 12/19/2015  . Mild cognitive impairment 07/26/2015  . Cerebrovascular accident (CVA) due to thrombosis of left carotid artery ([place]) 07/26/2015  . Hyperlipidemia 07/26/2015  . Cerebrovascular accident (CVA) ([place])   . Acute encephalopathy   . Altered mental status 07/15/2015  . COPD exacerbation ([place]) 07/15/2015  . Hyponatremia 07/15/2015  . Essential hypertension 07/15/2015  . CVA (cerebral vascular accident) ([place]) 07/15/2015  . Chronic respiratory failure with hypoxia ([place]) 07/15/2015  . Diabetes mellitus ([place]) 07/15/2015  . Bilateral inguinal hernia 03/04/2013  . Vasomotor rhinitis 11/29/2012  . COPD mixed type ([place]) 11/08/2012  . Anosmia 11/08/2012    Past Surgical History:  Procedure Laterality Date  . CATARACT EXTRACTION W/ INTRAOCULAR LENS IMPLANT Left 2011  . COLONOSCOPY N/A 01/03/2016   Procedure: COLONOSCOPY;  Surgeon: Aviva Signs, MD;  Location[facility];  Service: Gastroenterology;  Laterality: N/A;  . ESOPHAGEAL DILATION  2000's   x2  . EYE SURGERY    . INGUINAL HERNIA REPAIR Right 1970's  . INGUINAL HERNIA REPAIR Bilateral 03/12/2013   w/mesh bilaterally/notes 03/12/2013  . INGUINAL HERNIA REPAIR Bilateral 03/12/2013   Procedure: HERNIA REPAIR INGUINAL ADULT BILATERAL;  Surgeon: Merrie Roof, MD;  Location: [facility];  Service: [name];  Laterality: Bilateral;  . INSERTION OF MESH Bilateral 03/12/2013   Procedure: INSERTION OF MESH;  Surgeon: Merrie Roof, MD;  Location: [facility];  Service: [name];  Laterality: Bilateral;  . RETINAL DETACHMENT SURGERY Right 08/29/2010  . TONSILLECTOMY  1940's  . TRANSURETHRAL RESECTION OF PROSTATE  2002; 2013       Home Medications    Prior to Admission medications   Medication Sig Start Date End Date Taking? Authorizing Provider  albuterol (PROVENTIL HFA;VENTOLIN HFA) 108 (90 BASE) MCG/ACT inhaler Inhale 2 puffs into the lungs every 6 (six) hours as needed for wheezing or shortness of breath. 09/27/15  Yes Deneise Lever, MD  aspirin 325 MG tablet Take 325 mg by mouth daily.   Yes Historical Provider, MD  Cholecalciferol (D3 MAXIMUM STRENGTH) 5000 UNITS capsule Take 5,000 Units by mouth daily.   Yes Historical Provider, MD  donepezil (ARICEPT) 10 MG tablet Take 1 tablet daily 07/11/16  Yes Cameron Sprang, MD  fluticasone furoate-vilanterol (  BREO ELLIPTA) 100-25 MCG/INH AEPB Inhale 1 puff into the lungs daily. 09/03/16  Yes Tammy S Parrett, NP  ipratropium-albuterol (DUONEB) 0.5-2.5 (3) MG/3ML SOLN INHALE CONTENTS OF 1 VIAL IN NEBULIZER EVERY 6 HOURS AS NEEDED. 07/10/16  Yes Deneise Lever, MD  metFORMIN (GLUCOPHAGE-XR) 500 MG 24 hr tablet Take 1 tablet by mouth daily. 08/28/16  Yes Historical Provider, MD  nitroGLYCERIN (NITROSTAT) 0.4 MG SL tablet Place 0.4 mg under the tongue every 5 (five) minutes as needed for chest pain.  Reported on 03/19/2016   Yes Historical Provider, MD  OXYGEN Inhale into the lungs. On 2 liters during the day and at night   Yes Historical Provider, MD  polyethylene glycol (MIRALAX / GLYCOLAX) packet Take 17 g by mouth daily.   Yes Historical Provider, MD  Respiratory Therapy Supplies (FLUTTER) DEVI Use as directed 04/18/16  Yes Deneise Lever, MD  simvastatin (ZOCOR) 20 MG tablet TAKE ONE TABLET BY MOUTH DAILY AT 6 PM 07/23/16  Yes Golden Circle, FNP  theophylline (UNIPHYL) 400 MG 24 hr tablet TAKE 1/2 TABLET BY MOUTH THREE TIMES DAILY 04/25/16  Yes Deneise Lever, MD  cephALEXin (KEFLEX) 500 MG capsule Take 1 capsule (500 mg total) by mouth 4 (four) times daily. 09/09/16   Varney Biles, MD    Family History Family History  Problem Relation Age of Onset  . Stroke Mother   . Healthy Father     Social History Social History  Substance Use Topics  . Smoking status: Former Smoker    Packs/day: 2.00    Years: 35.00    Types: Cigarettes    Quit date: 10/08/1985  . Smokeless tobacco: Former Systems developer    Types: Chew    Quit date: 10/08/1997  . Alcohol use No     Allergies   Patient has no known allergies.   Review of Systems Review of Systems  HENT: Positive for nosebleeds. Negative for congestion.   Respiratory: Negative for shortness of breath.   Cardiovascular: Negative for chest pain.  Allergic/Immunologic: Negative for immunocompromised state.  Hematological: Does not bruise/bleed easily.     Physical Exam Updated Vital Signs BP (!) 175/119 (BP Location: Right Arm)   Pulse 62   Temp 97.9 F (36.6 C)   Resp 18   Ht 5\' 11"  (1.803 m)   Wt 178 lb (80.7 kg)   SpO2 98%   BMI 24.83 kg/m   Physical Exam  Constitutional: He is oriented to person, place, and time. He appears well-developed.  HENT:  Head: Atraumatic.  L nare has bleeding - source not identified  Neck: Neck supple.  Cardiovascular: Normal rate.   Pulmonary/Chest: Effort normal.  Neurological: He is  alert and oriented to person, place, and time.  Skin: Skin is warm.  Nursing note and vitals reviewed.    ED Treatments / Results  Labs (all labs ordered are listed, but only abnormal results are displayed) Labs Reviewed - No data to display  EKG  EKG Interpretation None       Radiology No results found.  Procedures .Epistaxis Management Date/Time: 09/09/2016 11:05 PM Performed by: Varney Biles Authorized by: Varney Biles   Consent:    Consent obtained:  Verbal   Consent given by:  Patient   Risks discussed:  Bleeding, infection, nasal injury and pain   Alternatives discussed:  No treatment and delayed treatment Anesthesia (see MAR for exact dosages):    Anesthesia method:  None Procedure details:    Treatment site:  L anterior   Treatment method:  Anterior pack   Treatment complexity:  Limited Post-procedure details:    Assessment:  Bleeding stopped   Patient tolerance of procedure:  Tolerated well, no immediate complications   (including critical care time)  Medications Ordered in ED Medications  silver nitrate applicators A999333 % applicator (not administered)  cephALEXin (KEFLEX) capsule 500 mg (not administered)  HYDROcodone-acetaminophen (NORCO/VICODIN) 5-325 MG per tablet 1 tablet (not administered)  oxymetazoline (AFRIN) 0.05 % nasal spray 1 spray (1 spray Each Nare Given 09/09/16 1732)  tranexamic acid (CYKLOKAPRON) injection 500 mg (500 mg Topical Given 09/09/16 2115)  ipratropium-albuterol (DUONEB) 0.5-2.5 (3) MG/3ML nebulizer solution 3 mL (3 mLs Nebulization Given 09/09/16 2154)     Initial Impression / Assessment and Plan / ED Course  I have reviewed the triage vital signs and the nursing notes.  Pertinent labs & imaging results that were available during my care of the patient were reviewed by me and considered in my medical decision making (see chart for details).  Clinical Course as of Sep 09 2305  Nancy Fetter Sep 09, 2016  1851 Bleeding has  stopped. Pt observed for another 45 minutes - and no rebleeds occurred. We will d/c. Strict ER return precautions have been discussed, and patient is agreeing with the plan and is comfortable with the workup done and the recommendations from the ER.   [AN]  2303 Patient made it to the parking lot and started bleeding. We tried TXA. Pt had some dyspnea reportedly after TXA. Thus we placed a rapid rhino. Pt tolerated it well. Stable for d/c.  [AN]    Clinical Course User Index [AN] Varney Biles, MD    Pt comes in with epistaxis. We help direct pressure for 5 minutes, and he still has some bleeding. We will apply afrin, hold direct pressure and reassess.   Final Clinical Impressions(s) / ED Diagnoses   Final diagnoses:  Epistaxis    New Prescriptions New Prescriptions   CEPHALEXIN (KEFLEX) 500 MG CAPSULE    Take 1 capsule (500 mg total) by mouth 4 (four) times daily.     Varney Biles, MD 09/09/16 1851    Varney Biles, MD 09/09/16 2306

## 2016-09-09 NOTE — ED Notes (Signed)
Epistaxis tray and rhino-rockets set up at bedside for MD. Patient has bleeding from left nare that started about 1300 today.

## 2016-09-09 NOTE — Discharge Instructions (Addendum)
Well - your bleeding wouldn't stop without the packing - so we placed the nasal packing. Please see Dr. Janace Hoard or Dr. Melene Plan on Wednesday. Dr. Janace Hoard in the on call doctor - so if Dr. Melene Plan cannot see you, he will be the doctor to see.

## 2016-09-09 NOTE — ED Notes (Signed)
Patient with no complaints at this time. Respirations even and unlabored. Skin warm/dry. Discharge instructions reviewed with patient at this time. Patient given opportunity to voice concerns/ask questions. Patient discharged at this time and left Emergency Department with steady gait.   

## 2016-09-10 ENCOUNTER — Telehealth: Payer: Self-pay | Admitting: Neurology

## 2016-09-10 NOTE — Telephone Encounter (Signed)
Patient wife Remo Lipps called and wants to talk to someone about nosebleeds please call [phone]

## 2016-09-10 NOTE — Telephone Encounter (Signed)
Patient's wife made aware to decrease ASA. They will call with any problems.

## 2016-09-10 NOTE — Telephone Encounter (Signed)
Spoke with patient's wife and she states that patient had nose bleeds off and on before but 3 weeks ago he had one that they could not stop at home and another one yesterday they couldn't stop. He had to be seen in the ER both times and have it packed. He is on ASA 325 mg daily per Dr. Delice Lesch.   They didn't know if it was safe for him to lower or stop that because of these nose bleeds. They are looking for advise. No other blood thinners. He hasn't started any other new medication. Please advise.

## 2016-09-10 NOTE — Telephone Encounter (Signed)
OK to reduce aspirin to 81mg  daily and see if this helps.

## 2016-09-13 ENCOUNTER — Ambulatory Visit (INDEPENDENT_AMBULATORY_CARE_PROVIDER_SITE_OTHER): Payer: Medicare Other | Admitting: Otolaryngology

## 2016-09-13 DIAGNOSIS — R040 Epistaxis: Secondary | ICD-10-CM | POA: Diagnosis not present

## 2016-09-26 ENCOUNTER — Other Ambulatory Visit: Payer: Self-pay | Admitting: Family

## 2016-10-11 ENCOUNTER — Ambulatory Visit (INDEPENDENT_AMBULATORY_CARE_PROVIDER_SITE_OTHER): Payer: Medicare Other | Admitting: Otolaryngology

## 2016-10-11 DIAGNOSIS — R040 Epistaxis: Secondary | ICD-10-CM | POA: Diagnosis not present

## 2016-10-19 ENCOUNTER — Ambulatory Visit: Payer: Medicare Other | Admitting: Internal Medicine

## 2016-11-01 ENCOUNTER — Other Ambulatory Visit: Payer: Self-pay | Admitting: Family

## 2016-11-06 ENCOUNTER — Other Ambulatory Visit: Payer: Self-pay

## 2016-11-06 MED ORDER — SIMVASTATIN 20 MG PO TABS: ORAL_TABLET | ORAL | 0 refills | Status: DC

## 2016-11-06 NOTE — Telephone Encounter (Signed)
Called pt to get appointment set up for CPE. Made wife aware that he has not had his cholesterol checked in a while and needs to be seen for refills of cholesterol medication. Set up a CPE form 11/15/16. Sent in 30 day supply until then.

## 2016-11-11 ENCOUNTER — Encounter (HOSPITAL_COMMUNITY): Payer: Self-pay | Admitting: *Deleted

## 2016-11-11 ENCOUNTER — Emergency Department (HOSPITAL_COMMUNITY): Payer: Medicare Other

## 2016-11-11 ENCOUNTER — Inpatient Hospital Stay (HOSPITAL_COMMUNITY)
Admission: EM | Admit: 2016-11-11 | Discharge: 2016-11-13 | DRG: 065 | Disposition: A | Discharge status: Home Health | Payer: Medicare Other | Attending: Family Medicine | Admitting: Family Medicine

## 2016-11-11 DIAGNOSIS — J449 Chronic obstructive pulmonary disease, unspecified: Secondary | ICD-10-CM | POA: Diagnosis present

## 2016-11-11 DIAGNOSIS — Z961 Presence of intraocular lens: Secondary | ICD-10-CM | POA: Diagnosis present

## 2016-11-11 DIAGNOSIS — R519 Headache, unspecified: Secondary | ICD-10-CM

## 2016-11-11 DIAGNOSIS — Z8673 Personal history of transient ischemic attack (TIA), and cerebral infarction without residual deficits: Secondary | ICD-10-CM

## 2016-11-11 DIAGNOSIS — J441 Chronic obstructive pulmonary disease with (acute) exacerbation: Secondary | ICD-10-CM | POA: Diagnosis present

## 2016-11-11 DIAGNOSIS — R2681 Unsteadiness on feet: Secondary | ICD-10-CM

## 2016-11-11 DIAGNOSIS — F0150 Vascular dementia without behavioral disturbance: Secondary | ICD-10-CM | POA: Diagnosis present

## 2016-11-11 DIAGNOSIS — E119 Type 2 diabetes mellitus without complications: Secondary | ICD-10-CM | POA: Diagnosis present

## 2016-11-11 DIAGNOSIS — Z79899 Other long term (current) drug therapy: Secondary | ICD-10-CM

## 2016-11-11 DIAGNOSIS — Z87891 Personal history of nicotine dependence: Secondary | ICD-10-CM

## 2016-11-11 DIAGNOSIS — M1990 Unspecified osteoarthritis, unspecified site: Secondary | ICD-10-CM | POA: Diagnosis present

## 2016-11-11 DIAGNOSIS — Z7982 Long term (current) use of aspirin: Secondary | ICD-10-CM

## 2016-11-11 DIAGNOSIS — K219 Gastro-esophageal reflux disease without esophagitis: Secondary | ICD-10-CM | POA: Diagnosis present

## 2016-11-11 DIAGNOSIS — R51 Headache: Secondary | ICD-10-CM

## 2016-11-11 DIAGNOSIS — E785 Hyperlipidemia, unspecified: Secondary | ICD-10-CM | POA: Diagnosis present

## 2016-11-11 DIAGNOSIS — G459 Transient cerebral ischemic attack, unspecified: Secondary | ICD-10-CM

## 2016-11-11 DIAGNOSIS — R410 Disorientation, unspecified: Secondary | ICD-10-CM | POA: Diagnosis present

## 2016-11-11 DIAGNOSIS — R4182 Altered mental status, unspecified: Secondary | ICD-10-CM | POA: Diagnosis present

## 2016-11-11 DIAGNOSIS — Z7984 Long term (current) use of oral hypoglycemic drugs: Secondary | ICD-10-CM

## 2016-11-11 DIAGNOSIS — Z823 Family history of stroke: Secondary | ICD-10-CM

## 2016-11-11 DIAGNOSIS — I639 Cerebral infarction, unspecified: Principal | ICD-10-CM | POA: Diagnosis present

## 2016-11-11 DIAGNOSIS — I10 Essential (primary) hypertension: Secondary | ICD-10-CM | POA: Diagnosis present

## 2016-11-11 DIAGNOSIS — G3184 Mild cognitive impairment, so stated: Secondary | ICD-10-CM | POA: Diagnosis present

## 2016-11-11 DIAGNOSIS — Z9889 Other specified postprocedural states: Secondary | ICD-10-CM

## 2016-11-11 DIAGNOSIS — Z9842 Cataract extraction status, left eye: Secondary | ICD-10-CM

## 2016-11-11 LAB — APTT: aPTT: 27 seconds (ref 24–36)

## 2016-11-11 LAB — CBC
HCT: 43.8 % (ref 39.0–52.0)
Hemoglobin: 15 g/dL (ref 13.0–17.0)
MCH: 31.4 pg (ref 26.0–34.0)
MCHC: 34.2 g/dL (ref 30.0–36.0)
MCV: 91.8 fL (ref 78.0–100.0)
PLATELETS: 242 10*3/uL (ref 150–400)
RBC: 4.77 MIL/uL (ref 4.22–5.81)
RDW: 12.9 % (ref 11.5–15.5)
WBC: 7.4 10*3/uL (ref 4.0–10.5)

## 2016-11-11 LAB — COMPREHENSIVE METABOLIC PANEL
ALBUMIN: 3.9 g/dL (ref 3.5–5.0)
ALT: 12 U/L — ABNORMAL LOW (ref 17–63)
ANION GAP: 6 (ref 5–15)
AST: 18 U/L (ref 15–41)
Alkaline Phosphatase: 82 U/L (ref 38–126)
BUN: 15 mg/dL (ref 6–20)
CO2: 33 mmol/L — ABNORMAL HIGH (ref 22–32)
Calcium: 9.7 mg/dL (ref 8.9–10.3)
Chloride: 96 mmol/L — ABNORMAL LOW (ref 101–111)
Creatinine, Ser: 1.06 mg/dL (ref 0.61–1.24)
GFR calc Af Amer: >60 mL/min (ref >60)
GFR calc non Af Amer: >60 mL/min (ref >60)
GLUCOSE: 144 mg/dL — AB (ref 65–99)
POTASSIUM: 3.6 mmol/L (ref 3.5–5.1)
SODIUM: 135 mmol/L (ref 135–145)
TOTAL PROTEIN: 6.9 g/dL (ref 6.5–8.1)
Total Bilirubin: 0.6 mg/dL (ref 0.3–1.2)

## 2016-11-11 LAB — DIFFERENTIAL
Basophils Absolute: 0 10*3/uL (ref 0.0–0.1)
Basophils Relative: 0 %
EOS ABS: 0.3 10*3/uL (ref 0.0–0.7)
EOS PCT: 4 %
Lymphocytes Relative: 22 %
Lymphs Abs: 1.6 10*3/uL (ref 0.7–4.0)
Monocytes Absolute: 0.8 10*3/uL (ref 0.1–1.0)
Monocytes Relative: 11 %
NEUTROS PCT: 63 %
Neutro Abs: 4.6 10*3/uL (ref 1.7–7.7)

## 2016-11-11 LAB — I-STAT TROPONIN, ED: Troponin i, poc: 0 ng/mL (ref 0.00–0.08)

## 2016-11-11 LAB — PROTIME-INR
INR: 1.04
PROTHROMBIN TIME: 13.6 s (ref 11.4–15.2)

## 2016-11-11 NOTE — ED Triage Notes (Signed)
Pt brought in by [facility] for c/o sudden headache and confusion; spouse told ems that pt c/o sudden headache 15 mins pta and pt was unable to find the bathroom; pt is alert and able to state his birthdate; pt unable to state todays date

## 2016-11-11 NOTE — ED Provider Notes (Signed)
[facility] DEPT Provider Note   CSN: [id number] Arrival date & time: 11/11/16  2239  By signing my name below, I, Luis [name], attest that this documentation has been prepared under the direction and in the presence of Rolland Porter, MD . Electronically Signed: Higinio [name], Scribe. 11/11/2016. 11:12 PM.  Time seen 23:00 PM  History   Chief Complaint Chief Complaint  Patient presents with  . Headache   The history is provided by the spouse. No language interpreter was used.   HPI Comments: Luis [name] is a 83 y.o. right hand dominant male with PMHx of COPD, HTN, emphysema, and dysphagia, brought in by [facility] to the Emergency Department for an evaluation of sudden onset, "throbbing," frontal headache and confusion that began ~3 hours PTA. Per wife, pt's headache began ~1 hour ago but his confusion began ~3 hours PTA. She states pt has been "wobbly" throughout the day today and "could not remember where the bathroom was this evening." She notes pt had a stroke on 07/15/15 in which pt also complained of a headache and confusion but did not experience any slurred speech or weakness on one side. Pt's wife reports pt lost his short-term memory after this stroke. She notes pt uses 2L O2 at home as needed for his COPD. Pt denies any blurry vision, chest pain, shortness of breath, numbness. They deny hx of smoking or EtOH use. He does endorse frontal headache that is throbbing, but indicates he is aware it is there, but it isn't bad.    PCP: Dr. Leighton Ruff at [facility]  Neurology Dr Delice Lesch  Past Medical History:  Diagnosis Date  . Arthritis   . COPD (chronic obstructive pulmonary disease) ([place])   . Diabetes mellitus without complication ([place])   . Dysphagia   . Emphysema   . Emphysema of lung ([place])   . Exertional shortness of breath   . GERD (gastroesophageal reflux disease)   . Heart murmur    as a child  . High cholesterol   . Hypertension     Patient Active Problem List   Diagnosis Date Noted  . Urinary frequency 08/01/2016  . Hypokalemia 08/01/2016  . Cough 07/13/2016  . General weakness 07/13/2016  . Loss of weight 04/18/2016  . History of stroke 12/29/2015  . Lower GI bleed 12/19/2015  . GI bleed 12/19/2015  . Mild cognitive impairment 07/26/2015  . Cerebrovascular accident (CVA) due to thrombosis of left carotid artery ([place]) 07/26/2015  . Hyperlipidemia 07/26/2015  . Cerebrovascular accident (CVA) ([place])   . Acute encephalopathy   . Altered mental status 07/15/2015  . COPD exacerbation ([place]) 07/15/2015  . Hyponatremia 07/15/2015  . Essential hypertension 07/15/2015  . CVA (cerebral vascular accident) ([place]) 07/15/2015  . Chronic respiratory failure with hypoxia ([place]) 07/15/2015  . Diabetes mellitus ([place]) 07/15/2015  . Bilateral inguinal hernia 03/04/2013  . Vasomotor rhinitis 11/29/2012  . COPD mixed type ([place]) 11/08/2012  . Anosmia 11/08/2012    Past Surgical History:  Procedure Laterality Date  . CATARACT EXTRACTION W/ INTRAOCULAR LENS IMPLANT Left 2011  . COLONOSCOPY N/A 01/03/2016   Procedure: COLONOSCOPY;  Surgeon: Aviva Signs, MD;  Location[facility];  Service: Gastroenterology;  Laterality: N/A;  . ESOPHAGEAL DILATION  2000's   x2  . EYE SURGERY    . INGUINAL HERNIA REPAIR Right 1970's  . INGUINAL HERNIA REPAIR Bilateral 03/12/2013   w/mesh bilaterally/notes 03/12/2013  . INGUINAL HERNIA REPAIR Bilateral 03/12/2013   Procedure: HERNIA REPAIR INGUINAL ADULT BILATERAL;  Surgeon: Merrie Roof, MD;  Location: [facility];  Service: General;  Laterality: Bilateral;  . INSERTION OF MESH Bilateral 03/12/2013   Procedure: INSERTION OF MESH;  Surgeon: Merrie Roof, MD;  Location: [facility];  Service: General;  Laterality: Bilateral;  . RETINAL DETACHMENT SURGERY Right 08/29/2010  . TONSILLECTOMY  1940's  . TRANSURETHRAL RESECTION OF PROSTATE  2002; 2013    Home Medications    Prior to Admission medications   Medication Sig Start Date End  Date Taking? Authorizing Provider  albuterol (PROVENTIL HFA;VENTOLIN HFA) 108 (90 BASE) MCG/ACT inhaler Inhale 2 puffs into the lungs every 6 (six) hours as needed for wheezing or shortness of breath. 09/27/15   Deneise Lever, MD  aspirin 325 MG tablet Take 325 mg by mouth daily.    Historical Provider, MD  cephALEXin (KEFLEX) 500 MG capsule Take 1 capsule (500 mg total) by mouth 4 (four) times daily. 09/09/16   Varney Biles, MD  Cholecalciferol (D3 MAXIMUM STRENGTH) 5000 UNITS capsule Take 5,000 Units by mouth daily.    Historical Provider, MD  donepezil (ARICEPT) 10 MG tablet Take 1 tablet daily 07/11/16   Cameron Sprang, MD  fluticasone furoate-vilanterol (BREO ELLIPTA) 100-25 MCG/INH AEPB Inhale 1 puff into the lungs daily. 09/03/16   Tammy S Parrett, NP  ipratropium-albuterol (DUONEB) 0.5-2.5 (3) MG/3ML SOLN INHALE CONTENTS OF 1 VIAL IN NEBULIZER EVERY 6 HOURS AS NEEDED. 07/10/16   Deneise Lever, MD  metFORMIN (GLUCOPHAGE-XR) 500 MG 24 hr tablet Take 1 tablet by mouth daily. 08/28/16   Historical Provider, MD  nitroGLYCERIN (NITROSTAT) 0.4 MG SL tablet Place 0.4 mg under the tongue every 5 (five) minutes as needed for chest pain. Reported on 03/19/2016    Historical Provider, MD  OXYGEN Inhale into the lungs. On 2 liters during the day and at night    Historical Provider, MD  polyethylene glycol (MIRALAX / GLYCOLAX) packet Take 17 g by mouth daily.    Historical Provider, MD  polyethylene glycol powder (GLYCOLAX/MIRALAX) powder TAKE 17 GRAMS (1 CAPFUL) IN 8 OZ OF FLUID ONCE DAILY 09/26/16   Golden Circle, FNP  Respiratory Therapy Supplies (FLUTTER) DEVI Use as directed 04/18/16   Deneise Lever, MD  simvastatin (ZOCOR) 20 MG tablet TAKE ONE TABLET BY MOUTH DAILY AT 6 PM 11/06/16   Golden Circle, FNP  theophylline (UNIPHYL) 400 MG 24 hr tablet TAKE 1/2 TABLET BY MOUTH THREE TIMES DAILY 04/25/16   Deneise Lever, MD    Family History Family History  Problem Relation Age of Onset  .  Stroke Mother   . Healthy Father     Social History Social History  Substance Use Topics  . Smoking status: Former Smoker    Packs/day: 2.00    Years: 35.00    Types: Cigarettes    Quit date: 10/08/1985  . Smokeless tobacco: Former Systems developer    Types: Chew    Quit date: 10/08/1997  . Alcohol use No  lives at home Lives with spouse   Allergies   Patient has no known allergies.   Review of Systems Review of Systems  Eyes: Negative for visual disturbance.  Respiratory: Negative for shortness of breath.   Cardiovascular: Negative for chest pain.  Neurological: Positive for headaches.  Psychiatric/Behavioral: Positive for confusion.  All other systems reviewed and are negative.  Physical Exam Updated Vital Signs ED Triage Vitals  Enc Vitals Group     BP 11/11/16 2300 159/81  Pulse Rate 11/11/16 2300 66     Resp 11/11/16 2300 19     Temp 11/11/16 2348 98.7 F (37.1 C)     Temp src --      SpO2 11/11/16 2300 98 %     Weight 11/11/16 2243 190 lb (86.2 kg)     Height 11/11/16 2243 5\' 11"  (1.803 m)     Head Circumference --      Peak Flow --      Pain Score --      Pain Loc --      Pain Edu? --      Excl. in [place]? --    Vital signs normal except for hypertension   Physical Exam  Constitutional: He is oriented to person, place, and time. He appears well-developed and well-nourished.  Non-toxic appearance. He does not appear ill. No distress.  HENT:  Head: Normocephalic and atraumatic.  Right Ear: External ear normal.  Left Ear: External ear normal.  Nose: Nose normal. No mucosal edema or rhinorrhea.  Mouth/Throat: Oropharynx is clear and moist and mucous membranes are normal. No dental abscesses or uvula swelling.  Eyes: Conjunctivae and EOM are normal. Pupils are equal, round, and reactive to light.  Neck: Normal range of motion and full passive range of motion without pain. Neck supple.  Cardiovascular: Normal rate, regular rhythm and normal heart sounds.  Exam  reveals no gallop and no friction rub.   No murmur heard. Pulmonary/Chest: Effort normal and breath sounds normal. No respiratory distress. He has no wheezes. He has no rhonchi. He has no rales. He exhibits no tenderness and no crepitus.  Abdominal: Soft. Normal appearance and bowel sounds are normal. He exhibits no distension. There is no tenderness. There is no rebound and no guarding.  Musculoskeletal: Normal range of motion. He exhibits no edema or tenderness.  Moves all extremities well.   Neurological: He is alert and oriented to person, place, and time. He has normal strength. No cranial nerve deficit.  Left grip is mildly weaker, but he is right handed. No pronator drift. No lower extremity weakness. Mild confusion.   Skin: Skin is warm, dry and intact. No rash noted. No erythema. No pallor.  Psychiatric: He has a normal mood and affect. His speech is normal and behavior is normal. His mood appears not anxious.  Nursing note and vitals reviewed.  ED Treatments / Results  Labs (all labs ordered are listed, but only abnormal results are displayed) Results for orders placed or performed during the hospital encounter of 11/11/16  Protime-INR  Result Value Ref Range   Prothrombin Time 13.6 11.4 - 15.2 seconds   INR 1.04   APTT  Result Value Ref Range   aPTT 27 24 - 36 seconds  CBC  Result Value Ref Range   WBC 7.4 4.0 - 10.5 K/uL   RBC 4.77 4.22 - 5.81 MIL/uL   Hemoglobin 15.0 13.0 - 17.0 g/dL   HCT 43.8 39.0 - 52.0 %   MCV 91.8 78.0 - 100.0 fL   MCH 31.4 26.0 - 34.0 pg   MCHC 34.2 30.0 - 36.0 g/dL   RDW 12.9 11.5 - 15.5 %   Platelets 242 150 - 400 K/uL  Differential  Result Value Ref Range   Neutrophils Relative % 63 %   Neutro Abs 4.6 1.7 - 7.7 K/uL   Lymphocytes Relative 22 %   Lymphs Abs 1.6 0.7 - 4.0 K/uL   Monocytes Relative 11 %   Monocytes Absolute  0.8 0.1 - 1.0 K/uL   Eosinophils Relative 4 %   Eosinophils Absolute 0.3 0.0 - 0.7 K/uL   Basophils Relative 0 %    Basophils Absolute 0.0 0.0 - 0.1 K/uL  Comprehensive metabolic panel  Result Value Ref Range   Sodium 135 135 - 145 mmol/L   Potassium 3.6 3.5 - 5.1 mmol/L   Chloride 96 (L) 101 - 111 mmol/L   CO2 33 (H) 22 - 32 mmol/L   Glucose, Bld 144 (H) 65 - 99 mg/dL   BUN 15 6 - 20 mg/dL   Creatinine, Ser 1.06 0.61 - 1.24 mg/dL   Calcium 9.7 8.9 - 10.3 mg/dL   Total Protein 6.9 6.5 - 8.1 g/dL   Albumin 3.9 3.5 - 5.0 g/dL   AST 18 15 - 41 U/L   ALT 12 (L) 17 - 63 U/L   Alkaline Phosphatase 82 38 - 126 U/L   Total Bilirubin 0.6 0.3 - 1.2 mg/dL   GFR calc non Af Amer >60 >60 mL/min   GFR calc Af Amer >60 >60 mL/min   Anion gap 6 5 - 15  Urine rapid drug screen (hosp performed)not at [facility]  Result Value Ref Range   Opiates NONE DETECTED NONE DETECTED   Cocaine NONE DETECTED NONE DETECTED   Benzodiazepines NONE DETECTED NONE DETECTED   Amphetamines NONE DETECTED NONE DETECTED   Tetrahydrocannabinol NONE DETECTED NONE DETECTED   Barbiturates NONE DETECTED NONE DETECTED  Urinalysis, Routine w reflex microscopic  Result Value Ref Range   Color, Urine YELLOW YELLOW   APPearance CLEAR CLEAR   Specific Gravity, Urine 1.017 1.005 - 1.030   pH 6.0 5.0 - 8.0   Glucose, UA NEGATIVE NEGATIVE mg/dL   Hgb urine dipstick NEGATIVE NEGATIVE   Bilirubin Urine NEGATIVE NEGATIVE   Ketones, ur NEGATIVE NEGATIVE mg/dL   Protein, ur NEGATIVE NEGATIVE mg/dL   Nitrite NEGATIVE NEGATIVE   Leukocytes, UA NEGATIVE NEGATIVE  I-stat troponin, ED (not at [facility], [facility])  Result Value Ref Range   Troponin i, poc 0.00 0.00 - 0.08 ng/mL   Comment 3           Laboratory interpretation all normal except hyperglycemia    EKG  EKG Interpretation  Date/Time:  Sunday November 11 2016 22:49:26 EST Ventricular Rate:  66 PR Interval:    QRS Duration: 89 QT Interval:  419 QTC Calculation: 439 R Axis:   57 Text Interpretation:  Sinus rhythm Atrial premature complexes Consider left atrial enlargement Abnormal R-wave  progression, early transition Electrode noise No significant change since last tracing 15 Jul 2015 Confirmed by [facility]  MD-I, Elizabethann Lackey ([id number]) on 11/11/2016 10:58:21 PM      Radiology Ct Head Wo Contrast  Result Date: 11/11/2016 CLINICAL DATA:  Sudden onset headache and confusion. EXAM: CT HEAD WITHOUT CONTRAST TECHNIQUE: Contiguous axial images were obtained from the base of the skull through the vertex without intravenous contrast. COMPARISON:  MRI brain 07/15/2015.  CT head 07/15/2015. FINDINGS: Brain: Diffuse cerebral atrophy. Ventricular dilatation consistent with central atrophy. Low-attenuation changes throughout the deep white matter consistent with small vessel ischemia. No abnormal extra-axial fluid collections. Gray-white matter junctions are distinct. No mass effect or midline shift. Basal cisterns are not effaced. No acute intracranial hemorrhage. Vascular: Vascular calcifications are present. Skull: Normal. Negative for fracture or focal lesion. Sinuses/Orbits: No acute finding. Paranasal sinuses are clear. Postoperative changes in the right globe. Other: No significant changes since prior study. Motion artifact limits the examination. IMPRESSION: No acute intracranial  abnormalities. Chronic atrophy and small vessel ischemic changes. Electronically Signed   By: Lucienne Capers M.D.   On: 11/11/2016 23:29    Procedures Procedures (including critical care time)  Medications Ordered in ED Medications - No data to display  Initial Impression / Assessment and [name] / ED Course  DIAGNOSTIC STUDIES:  Oxygen Saturation is 96% on RA, normal by my interpretation.    COORDINATION OF CARE:  11:11 PM Discussed treatment [name] with pt at bedside and pt agreed to [name]. Code stroke orders written without calling code stroke. Pt has no focal deficit.   I have reviewed the triage vital signs and the nursing notes.  Pertinent labs & imaging results that were available during my care of the patient were  reviewed by me and considered in my medical decision making (see chart for details).  CT and labs reviewed, UA still pending. Neurology consult ordered to discuss if he needs to go to [facility] so he can get a MRI or if he can stay here and get MRI in the morning.    12:07 AM Dr Gennaro Africa, neurology, [facility], states patient not a tPA candidate with just confusion, can stay at [facility] and get MRI in the morning.   01:36 Dr Marin Comment, hospitalist, will admit.  Final Clinical Impressions(s) / ED Diagnoses   Final diagnoses:  Confusion  Acute nonintractable headache, unspecified headache type    [name] admission  Rolland Porter, MD, Barbette Or, MD 11/12/16 [phone]

## 2016-11-11 NOTE — ED Notes (Signed)
Pt returned from ct,  

## 2016-11-12 ENCOUNTER — Observation Stay (HOSPITAL_COMMUNITY): Payer: Medicare Other

## 2016-11-12 ENCOUNTER — Observation Stay (HOSPITAL_BASED_OUTPATIENT_CLINIC_OR_DEPARTMENT_OTHER): Payer: Medicare Other

## 2016-11-12 ENCOUNTER — Ambulatory Visit (INDEPENDENT_AMBULATORY_CARE_PROVIDER_SITE_OTHER): Payer: Medicare Other | Admitting: Ophthalmology

## 2016-11-12 DIAGNOSIS — E785 Hyperlipidemia, unspecified: Secondary | ICD-10-CM | POA: Diagnosis present

## 2016-11-12 DIAGNOSIS — I10 Essential (primary) hypertension: Secondary | ICD-10-CM | POA: Diagnosis present

## 2016-11-12 DIAGNOSIS — Z8673 Personal history of transient ischemic attack (TIA), and cerebral infarction without residual deficits: Secondary | ICD-10-CM

## 2016-11-12 DIAGNOSIS — Z87891 Personal history of nicotine dependence: Secondary | ICD-10-CM | POA: Diagnosis not present

## 2016-11-12 DIAGNOSIS — F0150 Vascular dementia without behavioral disturbance: Secondary | ICD-10-CM | POA: Diagnosis present

## 2016-11-12 DIAGNOSIS — Z9889 Other specified postprocedural states: Secondary | ICD-10-CM | POA: Diagnosis not present

## 2016-11-12 DIAGNOSIS — J449 Chronic obstructive pulmonary disease, unspecified: Secondary | ICD-10-CM

## 2016-11-12 DIAGNOSIS — G459 Transient cerebral ischemic attack, unspecified: Secondary | ICD-10-CM | POA: Diagnosis not present

## 2016-11-12 DIAGNOSIS — J441 Chronic obstructive pulmonary disease with (acute) exacerbation: Secondary | ICD-10-CM | POA: Diagnosis present

## 2016-11-12 DIAGNOSIS — Z79899 Other long term (current) drug therapy: Secondary | ICD-10-CM | POA: Diagnosis not present

## 2016-11-12 DIAGNOSIS — I639 Cerebral infarction, unspecified: Secondary | ICD-10-CM | POA: Diagnosis present

## 2016-11-12 DIAGNOSIS — M1990 Unspecified osteoarthritis, unspecified site: Secondary | ICD-10-CM | POA: Diagnosis present

## 2016-11-12 DIAGNOSIS — K219 Gastro-esophageal reflux disease without esophagitis: Secondary | ICD-10-CM | POA: Diagnosis present

## 2016-11-12 DIAGNOSIS — Z961 Presence of intraocular lens: Secondary | ICD-10-CM | POA: Diagnosis present

## 2016-11-12 DIAGNOSIS — Z7984 Long term (current) use of oral hypoglycemic drugs: Secondary | ICD-10-CM | POA: Diagnosis not present

## 2016-11-12 DIAGNOSIS — Z9842 Cataract extraction status, left eye: Secondary | ICD-10-CM | POA: Diagnosis not present

## 2016-11-12 DIAGNOSIS — R410 Disorientation, unspecified: Secondary | ICD-10-CM | POA: Diagnosis present

## 2016-11-12 DIAGNOSIS — Z7982 Long term (current) use of aspirin: Secondary | ICD-10-CM | POA: Diagnosis not present

## 2016-11-12 DIAGNOSIS — E119 Type 2 diabetes mellitus without complications: Secondary | ICD-10-CM | POA: Diagnosis present

## 2016-11-12 DIAGNOSIS — Z823 Family history of stroke: Secondary | ICD-10-CM | POA: Diagnosis not present

## 2016-11-12 DIAGNOSIS — G9340 Encephalopathy, unspecified: Secondary | ICD-10-CM | POA: Diagnosis present

## 2016-11-12 LAB — ECHOCARDIOGRAM COMPLETE
AVLVOTPG: 4 mmHg
E decel time: 521 msec
E/e' ratio: 6.39
FS: 46 % — AB (ref 28–44)
Height: 71 in
IVS/LV PW RATIO, ED: 0.84
LA ID, A-P, ES: 34 mm
LA diam end sys: 34 mm
LA diam index: 1.75 cm/m2
LAVOL: 46.6 mL
LAVOLA4C: 48.8 mL
LAVOLIN: 24 mL/m2
LDCA: 2.84 cm2
LV E/e' medial: 6.39
LV E/e'average: 6.39
LV PW d: 10.7 mm — AB (ref 0.6–1.1)
LV dias vol: 63 mL (ref 62–150)
LVDIAVOLIN: 32 mL/m2
LVELAT: 7.62 cm/s
LVOT VTI: 22.2 cm
LVOT diameter: 19 mm
LVOT peak vel: 101 cm/s
LVOTSV: 63 mL
LVSYSVOL: 22 mL (ref 21–61)
LVSYSVOLIN: 12 mL/m2
MV Dec: 521
MV pk A vel: 84.4 m/s
MV pk E vel: 48.7 m/s
RV LATERAL S' VELOCITY: 11.2 cm/s
RV TAPSE: 25.4 mm
Simpson's disk: 64
Stroke v: 41 ml
TDI e' lateral: 7.62
TDI e' medial: 6.31
Weight: 2659.63 oz

## 2016-11-12 LAB — RAPID URINE DRUG SCREEN, HOSP PERFORMED
Amphetamines: NOT DETECTED
BENZODIAZEPINES: NOT DETECTED
Barbiturates: NOT DETECTED
Cocaine: NOT DETECTED
OPIATES: NOT DETECTED
Tetrahydrocannabinol: NOT DETECTED

## 2016-11-12 LAB — CBC
HCT: 43 % (ref 39.0–52.0)
Hemoglobin: 14.5 g/dL (ref 13.0–17.0)
MCH: 30.9 pg (ref 26.0–34.0)
MCHC: 33.7 g/dL (ref 30.0–36.0)
MCV: 91.7 fL (ref 78.0–100.0)
PLATELETS: 242 10*3/uL (ref 150–400)
RBC: 4.69 MIL/uL (ref 4.22–5.81)
RDW: 13 % (ref 11.5–15.5)
WBC: 8.3 10*3/uL (ref 4.0–10.5)

## 2016-11-12 LAB — URINALYSIS, ROUTINE W REFLEX MICROSCOPIC
Bilirubin Urine: NEGATIVE
Glucose, UA: NEGATIVE mg/dL
Hgb urine dipstick: NEGATIVE
Ketones, ur: NEGATIVE mg/dL
Leukocytes, UA: NEGATIVE
NITRITE: NEGATIVE
PH: 6 (ref 5.0–8.0)
Protein, ur: NEGATIVE mg/dL
SPECIFIC GRAVITY, URINE: 1.017 (ref 1.005–1.030)

## 2016-11-12 LAB — CREATININE, SERUM
Creatinine, Ser: 0.94 mg/dL (ref 0.61–1.24)
GFR calc non Af Amer: >60 mL/min (ref >60)

## 2016-11-12 LAB — TSH: TSH: 1.809 u[IU]/mL (ref 0.350–4.500)

## 2016-11-12 LAB — MRSA PCR SCREENING: MRSA by PCR: NEGATIVE

## 2016-11-12 MED ORDER — ACETAMINOPHEN 500 MG PO TABS
500.0000 mg | ORAL_TABLET | Freq: Four times a day (QID) | ORAL | Status: DC | PRN
  Administered 2016-11-12 – 2016-11-13 (×2): 500 mg via ORAL
  Filled 2016-11-12 (×2): qty 1

## 2016-11-12 MED ORDER — ALBUTEROL SULFATE (2.5 MG/3ML) 0.083% IN NEBU: 3.0000 mL | INHALATION_SOLUTION | Freq: Four times a day (QID) | RESPIRATORY_TRACT | Status: DC | PRN

## 2016-11-12 MED ORDER — CLOPIDOGREL BISULFATE 75 MG PO TABS
75.0000 mg | ORAL_TABLET | Freq: Every day | ORAL | Status: DC
  Administered 2016-11-12 – 2016-11-13 (×2): 75 mg via ORAL
  Filled 2016-11-12 (×2): qty 1

## 2016-11-12 MED ORDER — DONEPEZIL HCL 5 MG PO TABS
10.0000 mg | ORAL_TABLET | Freq: Every day | ORAL | Status: DC
  Administered 2016-11-12: 10 mg via ORAL
  Filled 2016-11-12: qty 2

## 2016-11-12 MED ORDER — HEPARIN SODIUM (PORCINE) 5000 UNIT/ML IJ SOLN
5000.0000 [IU] | Freq: Three times a day (TID) | INTRAMUSCULAR | Status: DC
  Administered 2016-11-12 – 2016-11-13 (×5): 5000 [IU] via SUBCUTANEOUS
  Filled 2016-11-12 (×5): qty 1

## 2016-11-12 MED ORDER — FLUTICASONE FUROATE-VILANTEROL 100-25 MCG/INH IN AEPB
1.0000 | INHALATION_SPRAY | Freq: Every day | RESPIRATORY_TRACT | Status: DC
  Administered 2016-11-12 – 2016-11-13 (×2): 1 via RESPIRATORY_TRACT
  Filled 2016-11-12: qty 28

## 2016-11-12 MED ORDER — SIMVASTATIN 20 MG PO TABS
20.0000 mg | ORAL_TABLET | Freq: Every day | ORAL | Status: DC
  Administered 2016-11-12: 20 mg via ORAL
  Filled 2016-11-12: qty 1

## 2016-11-12 MED ORDER — STROKE: EARLY STAGES OF RECOVERY BOOK
Freq: Once | Status: AC
  Administered 2016-11-12: 15:00:00
  Filled 2016-11-12: qty 1

## 2016-11-12 MED ORDER — SODIUM CHLORIDE 0.9% FLUSH
3.0000 mL | Freq: Two times a day (BID) | INTRAVENOUS | Status: DC
  Administered 2016-11-12 – 2016-11-13 (×3): 3 mL via INTRAVENOUS

## 2016-11-12 MED ORDER — POLYETHYLENE GLYCOL 3350 17 G PO PACK
17.0000 g | PACK | Freq: Every day | ORAL | Status: DC
  Administered 2016-11-12 – 2016-11-13 (×2): 17 g via ORAL
  Filled 2016-11-12 (×2): qty 1

## 2016-11-12 MED ORDER — ASPIRIN EC 81 MG PO TBEC
81.0000 mg | DELAYED_RELEASE_TABLET | Freq: Every day | ORAL | Status: DC
  Administered 2016-11-12 – 2016-11-13 (×2): 81 mg via ORAL
  Filled 2016-11-12 (×2): qty 1

## 2016-11-12 NOTE — Progress Notes (Signed)
PT Cancellation Note  Patient Details Name: Luis [name] MRN: [id number] DOB: 1933/12/10   Cancelled Treatment:    Reason Eval/Treat Not Completed: Patient not medically ready. Chart reviewed, RN consulted. Family reports that AMS is worsening, pt now very confused and fidgety. FAmily also reporting that the patient is c/o worsening frontal HA. In room diastolic BP noted 0000000, and then reassessed as 190/32mm Hg. Holding PT evaluation at this time until patient's condition is more stable. Will attempt PT evaluation again at later date/time.   2:58 PM, 11/12/16 Etta Grandchild, PT, DPT Physical Therapist - [place] [phone] (mobile)

## 2016-11-12 NOTE — ED Notes (Signed)
Dr Le at bedside,  

## 2016-11-12 NOTE — Progress Notes (Signed)
Report given to [place], Rn. Pt transferred to room 316 via wheelchair accompanied by Rn, no complications during transport. All pt information given in report.

## 2016-11-12 NOTE — ED Notes (Signed)
Pt and family updated on plan of care, wife states that pt still seems "confused" as compared to his normal,

## 2016-11-12 NOTE — H&P (Signed)
History and Physical    Luis [name] [id number] DOB: October 07, 1934 DOA: 11/11/2016  PCP: Mauricio Po, FNP  Patient coming from: Home.    Chief Complaint:   HA, confusion, similar to his prior CVA>   HPI: Luis [name] is an 83 y.o. male with hx of COPD, prior tobacco abuse, prior CVA, HTN, HLD, DM without complication brought to the ER as he was having intermittent confusion tonight.  He did not have any motor loss, Vx changes, or any facial droop.  His family stated that in the past, when he had his CVA, he was having very similar symptoms of confusion and frontal HA.  It is therefore, when he started to have frontal HA, he was brought to the ER.  Work up included a head CT with no acute changes, and serology was unremarkable with normal renal Fx tests, no leukocytosis, and normal Hb.  His EKG showed NSR.  Neurology at [facility] was consulted by EDP and recommended an MRI of the brain, and it was felt he can be admitted here at [facility].  Hospitalist was asked to admit him for same.    ED Course:  See above.  Rewiew of Systems:  Constitutional: Negative for malaise, fever and chills. No significant weight loss or weight gain Eyes: Negative for eye pain, redness and discharge, diplopia, visual changes, or flashes of light. ENMT: Negative for ear pain, hoarseness, nasal congestion, sinus pressure and sore throat. No headaches; tinnitus, drooling, or problem swallowing. Cardiovascular: Negative for chest pain, palpitations, diaphoresis, dyspnea and peripheral edema. ; No orthopnea, PND Respiratory: Negative for cough, hemoptysis, wheezing and stridor. No pleuritic chestpain. Gastrointestinal: Negative for diarrhea, constipation,  melena, blood in stool, hematemesis, jaundice and rectal bleeding.    Genitourinary: Negative for frequency, dysuria, incontinence,flank pain and hematuria; Musculoskeletal: Negative for back pain and neck pain. Negative for swelling and trauma.;  Skin: . Negative for  pruritus, rash, abrasions, bruising and skin lesion.; ulcerations Neuro: Negative for lightheadedness and neck stiffness. Negative for weakness, altered level of consciousness , altered mental status, extremity weakness, burning feet, involuntary movement, seizure and syncope.  Psych: negative for anxiety, depression, insomnia, tearfulness, panic attacks, hallucinations, paranoia, suicidal or homicidal ideation    Past Medical History:  Diagnosis Date  . Arthritis   . COPD (chronic obstructive pulmonary disease) ([place])   . Diabetes mellitus without complication ([place])   . Dysphagia   . Emphysema   . Emphysema of lung ([place])   . Exertional shortness of breath   . GERD (gastroesophageal reflux disease)   . Heart murmur    as a child  . High cholesterol   . Hypertension     Past Surgical History:  Procedure Laterality Date  . CATARACT EXTRACTION W/ INTRAOCULAR LENS IMPLANT Left 2011  . COLONOSCOPY N/A 01/03/2016   Procedure: COLONOSCOPY;  Surgeon: Aviva Signs, MD;  Location: [facility];  Service: Gastroenterology;  Laterality: N/A;  . ESOPHAGEAL DILATION  2000's   x2  . EYE SURGERY    . INGUINAL HERNIA REPAIR Right 1970's  . INGUINAL HERNIA REPAIR Bilateral 03/12/2013   w/mesh bilaterally/notes 03/12/2013  . INGUINAL HERNIA REPAIR Bilateral 03/12/2013   Procedure: HERNIA REPAIR INGUINAL ADULT BILATERAL;  Surgeon: Merrie Roof, MD;  Location: [place];  Service: General;  Laterality: Bilateral;  . INSERTION OF MESH Bilateral 03/12/2013   Procedure: INSERTION OF MESH;  Surgeon: Merrie Roof, MD;  Location: [place];  Service: General;  Laterality: Bilateral;  .  RETINAL DETACHMENT SURGERY Right 08/29/2010  . TONSILLECTOMY  1940's  . TRANSURETHRAL RESECTION OF PROSTATE  2002; 2013     reports that he quit smoking about 31 years ago. His smoking use included Cigarettes. He has a 70.00 pack-year smoking history. He quit smokeless tobacco use about 19 years ago. His smokeless tobacco use  included Chew. He reports that he does not drink alcohol or use drugs.  No Known Allergies  Family History  Problem Relation Age of Onset  . Stroke Mother   . Healthy Father      Prior to Admission medications   Medication Sig Start Date End Date Taking? Authorizing Provider  albuterol (PROVENTIL HFA;VENTOLIN HFA) 108 (90 BASE) MCG/ACT inhaler Inhale 2 puffs into the lungs every 6 (six) hours as needed for wheezing or shortness of breath. 09/27/15   Deneise Lever, MD  aspirin 325 MG tablet Take 325 mg by mouth daily.    Historical Provider, MD  cephALEXin (KEFLEX) 500 MG capsule Take 1 capsule (500 mg total) by mouth 4 (four) times daily. 09/09/16   Varney Biles, MD  Cholecalciferol (D3 MAXIMUM STRENGTH) 5000 UNITS capsule Take 5,000 Units by mouth daily.    Historical Provider, MD  donepezil (ARICEPT) 10 MG tablet Take 1 tablet daily 07/11/16   Cameron Sprang, MD  fluticasone furoate-vilanterol (BREO ELLIPTA) 100-25 MCG/INH AEPB Inhale 1 puff into the lungs daily. 09/03/16   Tammy S Parrett, NP  ipratropium-albuterol (DUONEB) 0.5-2.5 (3) MG/3ML SOLN INHALE CONTENTS OF 1 VIAL IN NEBULIZER EVERY 6 HOURS AS NEEDED. 07/10/16   Deneise Lever, MD  metFORMIN (GLUCOPHAGE-XR) 500 MG 24 hr tablet Take 1 tablet by mouth daily. 08/28/16   Historical Provider, MD  nitroGLYCERIN (NITROSTAT) 0.4 MG SL tablet Place 0.4 mg under the tongue every 5 (five) minutes as needed for chest pain. Reported on 03/19/2016    Historical Provider, MD  OXYGEN Inhale into the lungs. On 2 liters during the day and at night    Historical Provider, MD  polyethylene glycol (MIRALAX / GLYCOLAX) packet Take 17 g by mouth daily.    Historical Provider, MD  polyethylene glycol powder (GLYCOLAX/MIRALAX) powder TAKE 17 GRAMS (1 CAPFUL) IN 8 OZ OF FLUID ONCE DAILY 09/26/16   Golden Circle, FNP  Respiratory Therapy Supplies (FLUTTER) DEVI Use as directed 04/18/16   Deneise Lever, MD  simvastatin (ZOCOR) 20 MG tablet TAKE ONE  TABLET BY MOUTH DAILY AT 6 PM 11/06/16   Golden Circle, FNP  theophylline (UNIPHYL) 400 MG 24 hr tablet TAKE 1/2 TABLET BY MOUTH THREE TIMES DAILY 04/25/16   Deneise Lever, MD    Physical Exam: Vitals:   11/12/16 0000 11/12/16 0031 11/12/16 0100 11/12/16 0130  BP: (!) 161/134 161/90 162/82 152/78  Pulse: 69 74 69 (!) 57  Resp: (!) 27 16 24 20   Temp:      SpO2: 100% 100% 99% 98%  Weight:      Height:          Constitutional: NAD, calm, comfortable Vitals:   11/12/16 0000 11/12/16 0031 11/12/16 0100 11/12/16 0130  BP: (!) 161/134 161/90 162/82 152/78  Pulse: 69 74 69 (!) 57  Resp: (!) 27 16 24 20   Temp:      SpO2: 100% 100% 99% 98%  Weight:      Height:       Eyes: PERRL, lids and conjunctivae normal ENMT: Mucous membranes are moist. Posterior pharynx clear of any exudate or lesions.Normal dentition.  Neck: normal, supple, no masses, no thyromegaly Respiratory: clear to auscultation bilaterally, no wheezing, no crackles. Normal respiratory effort. No accessory muscle use.  Cardiovascular: Regular rate and rhythm, no murmurs / rubs / gallops. No extremity edema. 2+ pedal pulses. No carotid bruits.  Abdomen: no tenderness, no masses palpated. No hepatosplenomegaly. Bowel sounds positive.  Musculoskeletal: no clubbing / cyanosis. No joint deformity upper and lower extremities. Good ROM, no contractures. Normal muscle tone.  Skin: no rashes, lesions, ulcers. No induration Neurologic: CN 2-12 grossly intact. Sensation intact, DTR normal. Strength 5/5 in all 4.  Psychiatric: Normal judgment and insight. Alert, but is confused.   Labs on Admission: I have personally reviewed following labs and imaging studies  CBC:  Recent Labs Lab 11/11/16 2254  WBC 7.4  NEUTROABS 4.6  HGB 15.0  HCT 43.8  MCV 91.8  PLT [id number]   Basic Metabolic Panel:  Recent Labs Lab 11/11/16 2254  NA 135  K 3.6  CL 96*  CO2 33*  GLUCOSE 144*  BUN 15  CREATININE 1.06  CALCIUM 9.7    GFR: Estimated Creatinine Clearance: 59.2 mL/min (by C-G formula based on SCr of 1.06 mg/dL). Liver Function Tests:  Recent Labs Lab 11/11/16 2254  AST 18  ALT 12*  ALKPHOS 82  BILITOT 0.6  PROT 6.9  ALBUMIN 3.9   Coagulation Profile:  Recent Labs Lab 11/11/16 2254  INR 1.04   Cardiac Enzymes: Urine analysis:    Component Value Date/Time   COLORURINE YELLOW 11/12/2016 0030   APPEARANCEUR CLEAR 11/12/2016 0030   LABSPEC 1.017 11/12/2016 0030   PHURINE 6.0 11/12/2016 0030   GLUCOSEU NEGATIVE 11/12/2016 0030   HGBUR NEGATIVE 11/12/2016 0030   BILIRUBINUR NEGATIVE 11/12/2016 0030   KETONESUR NEGATIVE 11/12/2016 0030   PROTEINUR NEGATIVE 11/12/2016 0030   UROBILINOGEN 0.2 07/15/2015 1920   NITRITE NEGATIVE 11/12/2016 0030   LEUKOCYTESUR NEGATIVE 11/12/2016 0030   Radiological Exams on Admission: Ct Head Wo Contrast  Result Date: 11/11/2016 CLINICAL DATA:  Sudden onset headache and confusion. EXAM: CT HEAD WITHOUT CONTRAST TECHNIQUE: Contiguous axial images were obtained from the base of the skull through the vertex without intravenous contrast. COMPARISON:  MRI brain 07/15/2015.  CT head 07/15/2015. FINDINGS: Brain: Diffuse cerebral atrophy. Ventricular dilatation consistent with central atrophy. Low-attenuation changes throughout the deep white matter consistent with small vessel ischemia. No abnormal extra-axial fluid collections. Gray-white matter junctions are distinct. No mass effect or midline shift. Basal cisterns are not effaced. No acute intracranial hemorrhage. Vascular: Vascular calcifications are present. Skull: Normal. Negative for fracture or focal lesion. Sinuses/Orbits: No acute finding. Paranasal sinuses are clear. Postoperative changes in the right globe. Other: No significant changes since prior study. Motion artifact limits the examination. IMPRESSION: No acute intracranial abnormalities. Chronic atrophy and small vessel ischemic changes. Electronically  Signed   By: Lucienne Capers M.D.   On: 11/11/2016 23:29    EKG: Independently reviewed.   Assessment/Plan Active Problems:   COPD mixed type (HCC)   Altered mental status   Essential hypertension   CVA (cerebral vascular accident) ([place])   Mild cognitive impairment   Hyperlipidemia   History of stroke   TIA (transient ischemic attack)    PLAN:   Confusion:  I suspect he has a TIA/CVA.  Will add Plavix to his ASA as he has been compliant with his ASA.  He had hx of epistaxis, but his wife stated it was cauterized, and is no longer a problem.  Will obtain MRI of the brain,  along with ECHO and carotid.  Neurology consultation order has been placed.    HTN: Permissive HTN is allowed.   COPD   Stable.  Will continue with his meds.  I will stop his theophyllin as it has a low therapeutic index, and he is not having significant wheezing. Will check a level to see if his symptoms were related to theophyllin toxicity.   HLD:  Continue with his statin.    DVT prophylaxis: SUB Q Heparin.  Code Status: FULL CODE.  Family Communication: wife at bedside.  Disposition Plan: Home when appropriate.  Consults called: [facility] neurology.   Admission status: OBS>    Darold Miley MD FACP. [facility]  If 7PM-7AM, please contact night-coverage [email or web address] Password [email or web address]  11/12/2016, 2:19 AM

## 2016-11-12 NOTE — Progress Notes (Signed)
*  PRELIMINARY RESULTS* Echocardiogram 2D Echocardiogram has been performed.  Samuel Germany 11/12/2016, 10:22 AM

## 2016-11-12 NOTE — Progress Notes (Signed)
Called to get report, ED nurse will return my call momentarily.   Ericka Pontiff, RN 3:04 AM 11/12/16

## 2016-11-12 NOTE — ED Notes (Signed)
Family at bedside denies any problems with swallowing or special diet,

## 2016-11-12 NOTE — Progress Notes (Signed)
Agree with HPI and Plan as per Dr. Marin Comment   83 y/o ? forer smoker + severe COPD on noct O2 History small subcortical left CVA 07/2015 [place] MMSE 07/2016 24/30 Hypertension Hyperlipidemia TY II DM History of sessile polyp + diverticulosis based on colonoscopy 12/2015--admitted in past for GI bleed Prior cardiac cath 11/2006-mod diseased 2 v--medical therapy  Admitted with sudden onset confiusion and inablity to do what was wnl for him Labs wnl CT head not concerning for new infarct  MRI performed however shows acute right frontal infarct He has no deficits on exam strength is normal within both upper and lower extremities no dysmetria and no past pointing no cerebellar signs and he is back to his functional baseline We will complete stroke workup with repeat echocardiogram, carotids We have placed him on aspirin and he will continue this dose in addition have added Plavix 75 He will need therapy evaluation for disposition and can probably transfer to [place] I had an extensive check with his wife and feel he may be stable for discharge as early as 11/13/2016   Verneita Griffes, MD [facility] (P) [phone]

## 2016-11-13 ENCOUNTER — Encounter (HOSPITAL_COMMUNITY): Payer: Self-pay | Admitting: Radiology

## 2016-11-13 ENCOUNTER — Telehealth: Payer: Self-pay | Admitting: Neurology

## 2016-11-13 ENCOUNTER — Inpatient Hospital Stay (HOSPITAL_COMMUNITY)
Admit: 2016-11-13 | Discharge: 2016-11-13 | Disposition: A | Discharge status: Home / Self Care | Payer: Medicare Other | Attending: Neurology | Admitting: Neurology

## 2016-11-13 ENCOUNTER — Inpatient Hospital Stay (HOSPITAL_COMMUNITY): Payer: Medicare Other

## 2016-11-13 DIAGNOSIS — R410 Disorientation, unspecified: Secondary | ICD-10-CM

## 2016-11-13 LAB — VITAMIN B12: Vitamin B-12: 358 pg/mL (ref 180–914)

## 2016-11-13 LAB — SEDIMENTATION RATE: SED RATE: 2 mm/h (ref 0–16)

## 2016-11-13 LAB — C-REACTIVE PROTEIN

## 2016-11-13 MED ORDER — CLOPIDOGREL BISULFATE 75 MG PO TABS: 75.0000 mg | ORAL_TABLET | Freq: Every day | ORAL | 0 refills | Status: DC

## 2016-11-13 MED ORDER — LABETALOL HCL 5 MG/ML IV SOLN
10.0000 mg | Freq: Once | INTRAVENOUS | Status: AC
  Administered 2016-11-13: 10 mg via INTRAVENOUS
  Filled 2016-11-13: qty 4

## 2016-11-13 MED ORDER — ASPIRIN 325 MG PO TBEC: 325.0000 mg | DELAYED_RELEASE_TABLET | Freq: Every day | ORAL | 0 refills | Status: DC

## 2016-11-13 MED ORDER — IOPAMIDOL (ISOVUE-370) INJECTION 76%
75.0000 mL | Freq: Once | INTRAVENOUS | Status: AC | PRN
  Administered 2016-11-13: 75 mL via INTRAVENOUS

## 2016-11-13 MED ORDER — ASPIRIN EC 325 MG PO TBEC: 325.0000 mg | DELAYED_RELEASE_TABLET | Freq: Every day | ORAL | Status: DC

## 2016-11-13 NOTE — Care Management Note (Addendum)
Case Management Note  Patient Details  Name: Luis [name] MRN: [id number] Date of Birth: [birth year]  Expected Discharge Date:  11/13/16               Expected Discharge Plan:  [place]  In-House Referral:  NA  Discharge planning Services  CM Consult  Post Acute Care Choice:  Durable Medical Equipment, Home Health Choice offered to:  Patient  DME Arranged:  Bedside commode, Hospital bed, Walker rolling DME Agency:    HH Arranged:  PT, OT, Nurse's Aide [place] Agency:  [place]  Status of Service:  Completed, signed off   Additional Comments: Pt discharging home today with [facility] services. Pt's wife, son and DIL at bedside to discuss DC plan. Pt will receive [place] services from [place]. Tommi Rumps of [place] aware of referral and will obtain pt info from chart. Family aware of difficulty finding [place] provider due to pt's insurance. They are agreeable to [facility] services being provided by [facility]. Family aware HH has 48 hrs to make first visit.  Pt needs RW, BSC and Hospital bed. Pt/family has chosen [employer] of DME providers. Information faxed and hospital bed will be delivered to pt's home. BSC and RW will be picked up at [place] by family.  Sherald Barge, RN 11/13/2016, 3:07 PM

## 2016-11-13 NOTE — Progress Notes (Deleted)
EEG Completed; Results Pending  

## 2016-11-13 NOTE — Evaluation (Signed)
Physical Therapy Evaluation Patient Details Name: [name] MRN: [id number] DOB: [birth year] Today's Date: 11/13/2016   History of Present Illness  Luis [name] is an 83 y.o. male with hx of COPD, prior tobacco abuse, prior CVA, HTN, HLD, DM without complication brought to the ER as he was having intermittent confusion tonight.  He did not have any motor loss, Vx changes, or any facial droop.  His family stated that in the past, when he had his CVA, he was having very similar symptoms of confusion and frontal HA.  It is therefore, when he started to have frontal HA, he was brought to the ER.  Work up included a head CT with no acute changes, and serology was unremarkable with normal renal Fx tests, no leukocytosis, and normal Hb.  His EKG showed NSR.  Neurology at [facility] was consulted by EDP and recommended an MRI of the brain, and it was felt he can be admitted here at [facility].  Hospitalist was asked to admit him for same.    Clinical Impression  Mr. Dufour is cooperative with therapy.  He is confused but is able to follow commands.  He normally ambulates with no assistive device.  He was able to come sit I.  He ambulated without an assistive device for 125 ft but was slightly unsteady on his feet.  He did not lose his balance at anytime but I feel he would benefit from a rolling walker at home Until his balance improves.  Due to his cognitive state I do not believe that he would be able to use a cane.       Follow Up Recommendations Home health PT    Equipment Recommendations  Rolling walker with 5" wheels    Recommendations for Other Services       Precautions / Restrictions Precautions Precautions: None Restrictions Weight Bearing Restrictions: No      Mobility  Bed Mobility               General bed mobility comments: Pt in chair and requested to go back to chair  Transfers Overall transfer level: Modified independent Equipment used: None                 Ambulation/Gait Ambulation/Gait assistance: Modified independent (Device/Increase time) Ambulation Distance (Feet): 125 Feet Assistive device: None Gait Pattern/deviations: Drifts right/left   Gait velocity interpretation: at or above normal speed for age/gender              Pertinent Vitals/Pain Pain Assessment: No/denies pain    Home Living Family/patient expects to be discharged to:: Private residence Living Arrangements: Spouse/significant other Available Help at Discharge: Family Type of Home: House Home Access: Stairs to enter   Technical brewer of Steps: 4   Home Equipment: None      Prior Function Level of Independence: Independent                  Extremity/Trunk Assessment   Upper Extremity Assessment Upper Extremity Assessment: Defer to OT evaluation    Lower Extremity Assessment Lower Extremity Assessment: Overall WFL for tasks assessed       Communication   Communication: No difficulties  Cognition Arousal/Alertness: Awake/alert Behavior During Therapy: WFL for tasks assessed/performed Overall Cognitive Status: Impaired/Different from baseline Area of Impairment: Orientation;Attention;Safety/judgement;Awareness Orientation Level: Place;Time;Situation       Safety/Judgement: Decreased awareness of safety                 Assessment/Plan  PT Assessment Patient needs continued PT services  PT Problem List Decreased activity tolerance;Decreased balance          PT Treatment Interventions Balance training;Gait training;Therapeutic exercise    PT Goals (Current goals can be found in the Care Plan section)  Acute Rehab PT Goals PT Goal Formulation: With patient/family Time For Goal Achievement: 11/16/16 Potential to Achieve Goals: Good Ambulate with rolling walker next treatment to assess improvement of stability  Frequency Min 3X/week    Recommend 24 hr care due to confused state but functionally pt can return  home  With home health therapy.        End of Session Equipment Utilized During Treatment: Gait belt Activity Tolerance: Patient tolerated treatment well Patient left: in chair;with call bell/phone within reach;with family/visitor present Nurse Communication: Mobility status         Time: 1300-1329 PT Time Calculation (min) (ACUTE ONLY): 29 min   Charges:   PT Evaluation $PT Eval Low Complexity: 1 Procedure     PT G CodesRayetta Humphrey, PT CLT [phone] 11/13/2016, 1:29 PM

## 2016-11-13 NOTE — Care Management Note (Signed)
Case Management Note  Patient Details  Name: Luis [name] MRN: [id number] Date of Birth: [birth year]  Subjective/Objective:                  Patient adm with CVA, from home with wife, ind with ADL's. Stroke workup in progress. Patient with increased confusion per family at bedside. PT eval not yet completed. No HH/DME PTA.   Action/Plan: CM following for needs.   Expected Discharge Date:     11/13/2016            Expected Discharge Plan:  [place]  In-House Referral:  NA  Discharge planning Services  CM Consult  Post Acute Care Choice:    Choice offered to:     DME Arranged:    DME Agency:     HH Arranged:    HH Agency:     Status of Service:  In process, will continue to follow  If discussed at Long Length of Stay Meetings, dates discussed:    Additional Comments:  Tationa Stech, Chauncey [place], RN 11/13/2016, 7:46 AM

## 2016-11-13 NOTE — Progress Notes (Signed)
EEG Completed; Results Pending  

## 2016-11-13 NOTE — Progress Notes (Signed)
Patient discharged home.  IV removed - WNL.  DC instructions and medication changes reviewed with wife.  Follow up appointments in place.  Educated on s/s of stroke and when to seek medical asst.  Handouts provided.  Verbalizes understanding.  No other questions at this time.  Encouraged to take medications as prescribed and to adhere to a heart healthy diet to prevent further stroke.  Patient assisted off unit in NAD.

## 2016-11-13 NOTE — Discharge Summary (Signed)
Physician Discharge Summary  Luis [name] [id number] DOB: [birth year] DOA: 11/11/2016  PCP: Mauricio Po, FNP  Admit date: 11/11/2016 Discharge date: 11/13/2016  Time spent: 35 minutes  Recommendations for Outpatient Follow-up:  1. Increased asa to 325, added plavix 75 this admit 2. Stop theophylline this admit-low therapeutic index drug 3. [place] pt and hospital bed ordered    Discharge Diagnoses:  Active Problems:   COPD mixed type ([place])   Altered mental status   Essential hypertension   CVA (cerebral vascular accident) ([place])   Mild cognitive impairment   Hyperlipidemia   History of stroke   TIA (transient ischemic attack)   Discharge Condition: improved  Diet recommendation: hh low salt  Filed Weights   11/11/16 2243 11/12/16 0353  Weight: 86.2 kg (190 lb) 75.4 kg (166 lb 3.6 oz)    History of present illness:   83 y/o ? forer smoker + severe COPD on noct O2 History small subcortical left CVA 07/2015 [place] MMSE 07/2016 24/30 Hypertension Hyperlipidemia TY II DM History of sessile polyp + diverticulosis based on colonoscopy 12/2015--admitted in past for GI bleed Prior cardiac cath 11/2006-mod diseased 2 v--medical therapy   MRI performed - shows acute right frontal infarct  repeat echocardiogram EF55-60%, Grd ! DD  repeat carotids showed no clots and <50%stenosis statin was increased to Lipitor 80- mg daily from zocor 20 Aspirin 81-->325  and he will continue this dose in addition have added Plavix 75 Therapy rec Home health and hospital bed was also ordered with trapeze bar  His dementia will need follow up with Dr. Delice Lesch in the future  Discharge Exam: Vitals:   11/13/16 0625 11/13/16 0842  BP: (!) 201/98 135/68  Pulse: 73 65  Resp: 18 20  Temp: 98.1 F (36.7 C)     General: eomi ncat Cardiovascular: s1 s2 no m/r/g Respiratory: clear no added sound Neurologically intact grossly, moves all 4 limbs, some confusion  Discharge  Instructions    Current Discharge Medication List    START taking these medications   Details  clopidogrel (PLAVIX) 75 MG tablet Take 1 tablet (75 mg total) by mouth daily. Qty: 30 tablet, Refills: 0      CONTINUE these medications which have CHANGED   Details  aspirin EC 325 MG EC tablet Take 1 tablet (325 mg total) by mouth daily. Qty: 30 tablet, Refills: 0      CONTINUE these medications which have NOT CHANGED   Details  albuterol (PROVENTIL HFA;VENTOLIN HFA) 108 (90 BASE) MCG/ACT inhaler Inhale 2 puffs into the lungs every 6 (six) hours as needed for wheezing or shortness of breath. Qty: 1 Inhaler, Refills: 11    Cholecalciferol (D3 MAXIMUM STRENGTH) 5000 UNITS capsule Take 5,000 Units by mouth daily.    donepezil (ARICEPT) 10 MG tablet Take 1 tablet daily Qty: 90 tablet, Refills: 3   Associated Diagnoses: Mild cognitive impairment    fluticasone furoate-vilanterol (BREO ELLIPTA) 100-25 MCG/INH AEPB Inhale 1 puff into the lungs daily. Qty: 1 each, Refills: 5    ipratropium-albuterol (DUONEB) 0.5-2.5 (3) MG/3ML SOLN INHALE CONTENTS OF 1 VIAL IN NEBULIZER EVERY 6 HOURS AS NEEDED. Qty: 90 mL, Refills: 11    metFORMIN (GLUCOPHAGE-XR) 500 MG 24 hr tablet Take 1 tablet by mouth daily.    nitroGLYCERIN (NITROSTAT) 0.4 MG SL tablet Place 0.4 mg under the tongue every 5 (five) minutes as needed for chest pain. Reported on 03/19/2016    OXYGEN Inhale into the lungs. On 2 liters during the day  and at night    polyethylene glycol (MIRALAX / GLYCOLAX) packet Take 17 g by mouth daily.    polyethylene glycol powder (GLYCOLAX/MIRALAX) powder TAKE 17 GRAMS (1 CAPFUL) IN 8 OZ OF FLUID ONCE DAILY Qty: 527 g, Refills: 2      STOP taking these medications     Respiratory Therapy Supplies (FLUTTER) DEVI      simvastatin (ZOCOR) 20 MG tablet      theophylline (UNIPHYL) 400 MG 24 hr tablet        Allergies  Allergen Reactions  . Tranexamic Acid Anaphylaxis    Had in 09/09/2016  in [facility] ED and had anaphylaxis reaction      The results of significant diagnostics from this hospitalization (including imaging, microbiology, ancillary and laboratory) are listed below for reference.    Significant Diagnostic Studies: Ct Angio Head W Or Wo Contrast  Result Date: 11/13/2016 CLINICAL DATA:  One week history of confusion. EXAM: CT ANGIOGRAPHY HEAD AND NECK TECHNIQUE: Multidetector CT imaging of the head and neck was performed using the standard protocol during bolus administration of intravenous contrast. Multiplanar CT image reconstructions and MIPs were obtained to evaluate the vascular anatomy. Carotid stenosis measurements (when applicable) are obtained utilizing NASCET criteria, using the distal internal carotid diameter as the denominator. CONTRAST:  75 cc Isovue 370 intravenous COMPARISON:  Brain MRI from yesterday FINDINGS: CT HEAD FINDINGS Brain: No visible acute infarction (finding on previous brain MRI is below CT threshold), hemorrhage, hydrocephalus, extra-axial collection or mass lesion/mass effect. Extensive white matter low-density in the bilateral cerebral hemispheres consistent with chronic microvascular disease. Vascular: See below Skull: No acute or aggressive finding Sinuses: No acute finding Orbits: Right cataract resection and scleral band. Review of the MIP images confirms the above findings CTA NECK FINDINGS Aortic arch: The arch is incompletely covered. The brachiocephalic left common carotid origins are not seen. Right carotid system: Mild scattered atherosclerotic plaque, greatest at the ICA bulb. The ICA is tortuous. No flow limiting stenosis, ulceration, or dissection. Left carotid system: Origin not visualized. Moderate calcified plaque at the common carotid bifurcation and ICA bulb without flow limiting stenosis, ulceration, or dissection. Vertebral arteries: No visualized proximal subclavian flow limiting stenosis. The right vertebral artery is dominant. There  is prominent narrowing of the left vertebral artery origin. At the level of the dura, there is visible less intense opacification of the vertebral arteries lumen. Best visualized on reformats is intermittent irregularity of the lumen, especially prominent at the level of C3. This finding is asymmetric. Abnormal flow has been seen in this vessel since at least 2016 brain MRI and there is no acute infarcts on recent brain MRI. Skeleton: No acute or aggressive finding Other neck: 33 mm left thyroid mass, size similar to chest CT from 2013. Upper chest: Saber trachea from COPD. Subpleural opacity at the right apex is likely scarring. Review of the MIP images confirms the above findings CTA HEAD FINDINGS Anterior circulation: Atherosclerotic calcification on the carotid siphons. No major branch occlusion or flow limiting stenosis. Negative for aneurysm. Notably negative ACA circulation in light of previous infarct. Posterior circulation: Less intense opacification of the left vertebral artery as described above. Mild atheromatous type narrowing of the left P2 segment. High-grade right P3 segment stenosis, at a bifurcation. No acute infarct in this region by previous brain MRI. Venous sinuses: No acute finding Anatomic variants: Incomplete circle-of-Willis. Small anterior communicating artery present. No posterior communicating arteries are seen. Delayed phase: No abnormal intracranial enhancement. Review of  the MIP images confirms the above findings IMPRESSION: 1. No branch occlusion or flow limiting stenosis to explain the recent small frontal infarct. 2. Subtle but convincing delayed opacification of the left vertebral artery, correlating with previous Doppler findings. This could be related to stenosis at the origin, but the lumen is intermittently and asymmetrically irregular, remote dissection is also considered. Left vertebral findings have an overall chronic appearance as noted above. The dominant right vertebral  artery is widely patent to the basilar. 3. Advanced chronic microvascular disease in the cerebral white matter. 4. Bilateral PCA atheromatous narrowings, mild on the left and advanced on the right. 5. Brachiocephalic and left common carotid origins were not covered. Electronically Signed   By: Monte Fantasia M.D.   On: 11/13/2016 12:47   Ct Head Wo Contrast  Result Date: 11/11/2016 CLINICAL DATA:  Sudden onset headache and confusion. EXAM: CT HEAD WITHOUT CONTRAST TECHNIQUE: Contiguous axial images were obtained from the base of the skull through the vertex without intravenous contrast. COMPARISON:  MRI brain 07/15/2015.  CT head 07/15/2015. FINDINGS: Brain: Diffuse cerebral atrophy. Ventricular dilatation consistent with central atrophy. Low-attenuation changes throughout the deep white matter consistent with small vessel ischemia. No abnormal extra-axial fluid collections. Gray-white matter junctions are distinct. No mass effect or midline shift. Basal cisterns are not effaced. No acute intracranial hemorrhage. Vascular: Vascular calcifications are present. Skull: Normal. Negative for fracture or focal lesion. Sinuses/Orbits: No acute finding. Paranasal sinuses are clear. Postoperative changes in the right globe. Other: No significant changes since prior study. Motion artifact limits the examination. IMPRESSION: No acute intracranial abnormalities. Chronic atrophy and small vessel ischemic changes. Electronically Signed   By: Lucienne Capers M.D.   On: 11/11/2016 23:29   Ct Angio Neck W Or Wo Contrast  Result Date: 11/13/2016 CLINICAL DATA:  One week history of confusion. EXAM: CT ANGIOGRAPHY HEAD AND NECK TECHNIQUE: Multidetector CT imaging of the head and neck was performed using the standard protocol during bolus administration of intravenous contrast. Multiplanar CT image reconstructions and MIPs were obtained to evaluate the vascular anatomy. Carotid stenosis measurements (when applicable) are  obtained utilizing NASCET criteria, using the distal internal carotid diameter as the denominator. CONTRAST:  75 cc Isovue 370 intravenous COMPARISON:  Brain MRI from yesterday FINDINGS: CT HEAD FINDINGS Brain: No visible acute infarction (finding on previous brain MRI is below CT threshold), hemorrhage, hydrocephalus, extra-axial collection or mass lesion/mass effect. Extensive white matter low-density in the bilateral cerebral hemispheres consistent with chronic microvascular disease. Vascular: See below Skull: No acute or aggressive finding Sinuses: No acute finding Orbits: Right cataract resection and scleral band. Review of the MIP images confirms the above findings CTA NECK FINDINGS Aortic arch: The arch is incompletely covered. The brachiocephalic left common carotid origins are not seen. Right carotid system: Mild scattered atherosclerotic plaque, greatest at the ICA bulb. The ICA is tortuous. No flow limiting stenosis, ulceration, or dissection. Left carotid system: Origin not visualized. Moderate calcified plaque at the common carotid bifurcation and ICA bulb without flow limiting stenosis, ulceration, or dissection. Vertebral arteries: No visualized proximal subclavian flow limiting stenosis. The right vertebral artery is dominant. There is prominent narrowing of the left vertebral artery origin. At the level of the dura, there is visible less intense opacification of the vertebral arteries lumen. Best visualized on reformats is intermittent irregularity of the lumen, especially prominent at the level of C3. This finding is asymmetric. Abnormal flow has been seen in this vessel since at least 2016 brain  MRI and there is no acute infarcts on recent brain MRI. Skeleton: No acute or aggressive finding Other neck: 33 mm left thyroid mass, size similar to chest CT from 2013. Upper chest: Saber trachea from COPD. Subpleural opacity at the right apex is likely scarring. Review of the MIP images confirms the  above findings CTA HEAD FINDINGS Anterior circulation: Atherosclerotic calcification on the carotid siphons. No major branch occlusion or flow limiting stenosis. Negative for aneurysm. Notably negative ACA circulation in light of previous infarct. Posterior circulation: Less intense opacification of the left vertebral artery as described above. Mild atheromatous type narrowing of the left P2 segment. High-grade right P3 segment stenosis, at a bifurcation. No acute infarct in this region by previous brain MRI. Venous sinuses: No acute finding Anatomic variants: Incomplete circle-of-Willis. Small anterior communicating artery present. No posterior communicating arteries are seen. Delayed phase: No abnormal intracranial enhancement. Review of the MIP images confirms the above findings IMPRESSION: 1. No branch occlusion or flow limiting stenosis to explain the recent small frontal infarct. 2. Subtle but convincing delayed opacification of the left vertebral artery, correlating with previous Doppler findings. This could be related to stenosis at the origin, but the lumen is intermittently and asymmetrically irregular, remote dissection is also considered. Left vertebral findings have an overall chronic appearance as noted above. The dominant right vertebral artery is widely patent to the basilar. 3. Advanced chronic microvascular disease in the cerebral white matter. 4. Bilateral PCA atheromatous narrowings, mild on the left and advanced on the right. 5. Brachiocephalic and left common carotid origins were not covered. Electronically Signed   By: Monte Fantasia M.D.   On: 11/13/2016 12:47   Mr Brain Wo Contrast  Result Date: 11/12/2016 CLINICAL DATA:  Sudden onset headache and confusion.  TIA. EXAM: MRI HEAD WITHOUT CONTRAST TECHNIQUE: Multiplanar, multiecho pulse sequences of the brain and surrounding structures were obtained without intravenous contrast. COMPARISON:  CT head without contrast 11/11/2016 FINDINGS:  Brain: A punctate focus of restricted diffusion is present within the medial anterior right frontal lobe on image 92 of series 3. Subtle T2 changes are associated. Extensive diffuse cerebral atrophy and confluent periventricular and subcortical white matter disease is present bilaterally. No other acute infarct is present. There is no acute hemorrhage. No mass lesion is present. Ventricles are proportionate to the degree of atrophy. Internal auditory canals are within normal limits. The brainstem and cerebellum are otherwise unremarkable. Vascular: The left vertebral artery is occluded. Flow is present within the anterior circulation. Flow is present in the right vertebral artery and basilar artery. Skull and upper cervical spine: The skullbase is within normal limits. Midline sagittal structures are unremarkable. Degenerative changes are present in the upper cervical spine. Marrow signal is normal. Sinuses/Orbits: The paranasal sinuses and mastoid air cells are clear. A right lens replacement is present. Scleral banding is present on the right. The globes and orbits are otherwise within normal limits. IMPRESSION: 1. Punctate area of acute restricted diffusion involving the anteromedial right frontal lobe. 2. Diffuse atrophy and confluent white matter disease likely reflects the sequela of severe age advanced chronic microvascular ischemia. Electronically Signed   By: San Morelle M.D.   On: 11/12/2016 07:56   US Carotid Bilateral  Result Date: 11/12/2016 CLINICAL DATA:  TIA. EXAM: BILATERAL CAROTID DUPLEX ULTRASOUND TECHNIQUE: Pearline Cables scale imaging, color Doppler and duplex ultrasound were performed of bilateral carotid and vertebral arteries in the neck. COMPARISON:  07/16/2015 FINDINGS: Criteria: Quantification of carotid stenosis is based on velocity  parameters that correlate the residual internal carotid diameter with NASCET-based stenosis levels, using the diameter of the distal internal carotid lumen  as the denominator for stenosis measurement. The following velocity measurements were obtained: RIGHT ICA:  54 cm/sec CCA:  79 cm/sec SYSTOLIC ICA/CCA RATIO:  0.7 DIASTOLIC ICA/CCA RATIO:  1.4 ECA:  77 cm/sec LEFT ICA:  54 cm/sec CCA:  71 cm/sec SYSTOLIC ICA/CCA RATIO:  0.8 DIASTOLIC ICA/CCA RATIO:  1.1 ECA:  72 cm/sec RIGHT CAROTID ARTERY: Mild atherosclerotic disease in the right common carotid artery. Atherosclerotic plaque at the right carotid bulb. External carotid artery is patent with normal waveform. Echogenic plaque in the proximal internal carotid artery. Normal waveforms and velocities in the internal carotid artery. RIGHT VERTEBRAL ARTERY: Antegrade flow and normal waveform in the right vertebral artery. LEFT CAROTID ARTERY: Mild atherosclerotic disease at left carotid bulb and proximal internal carotid artery. External carotid artery is patent with normal waveform. Normal waveforms and velocities in the internal carotid artery. LEFT VERTEBRAL ARTERY: Left vertebral artery appears to be patent but abnormal waveform. The abnormal waveform may be related to venous contamination in the waveform. Reportedly, this was a technically difficult examination due to patient confusion and motion. IMPRESSION: Mild atherosclerotic disease in the bilateral carotid arteries. Estimated degree of stenosis in the internal carotid arteries is less than 50% bilaterally. Limited evaluation of the left vertebral artery. Electronically Signed   By: Markus Daft M.D.   On: 11/12/2016 12:23    Microbiology: Recent Results (from the past 240 hour(s))  MRSA PCR Screening     Status: None   Collection Time: 11/12/16  3:45 AM  Result Value Ref Range Status   MRSA by PCR NEGATIVE NEGATIVE Final    Comment:        The GeneXpert MRSA Assay (FDA approved for NASAL specimens only), is one component of a comprehensive MRSA colonization surveillance program. It is not intended to diagnose MRSA infection nor to guide  or monitor treatment for MRSA infections.      Labs: Basic Metabolic Panel:  Recent Labs Lab 11/11/16 2254 11/12/16 0412  NA 135  --   K 3.6  --   CL 96*  --   CO2 33*  --   GLUCOSE 144*  --   BUN 15  --   CREATININE 1.06 0.94  CALCIUM 9.7  --    Liver Function Tests:  Recent Labs Lab 11/11/16 2254  AST 18  ALT 12*  ALKPHOS 82  BILITOT 0.6  PROT 6.9  ALBUMIN 3.9   No results for input(s): LIPASE, AMYLASE in the last 168 hours. No results for input(s): AMMONIA in the last 168 hours. CBC:  Recent Labs Lab 11/11/16 2254 11/12/16 0412  WBC 7.4 8.3  NEUTROABS 4.6  --   HGB 15.0 14.5  HCT 43.8 43.0  MCV 91.8 91.7  PLT 242 242   Cardiac Enzymes: No results for input(s): CKTOTAL, CKMB, CKMBINDEX, TROPONINI in the last 168 hours. BNP: BNP (last 3 results) No results for input(s): BNP in the last 8760 hours.  ProBNP (last 3 results) No results for input(s): PROBNP in the last 8760 hours.  CBG: No results for input(s): GLUCAP in the last 168 hours.     SignedNita Sells MD   [facility] 11/13/2016, 2:04 PM

## 2016-11-13 NOTE — Progress Notes (Signed)
Pts Bp 201/98. Dr. Truman Hayward paged.

## 2016-11-13 NOTE — Plan of Care (Signed)
Problem: Tissue Perfusion: Goal: Cerebral tissue perfusion will improve (applicable to all stroke diagnoses) Outcome: Progressing Pt alert to self and place at times. Wife in room. Pt follows commands.

## 2016-11-13 NOTE — Care Management Important Message (Signed)
Important Message  Patient Details  Name: Luis [name] MRN: [id number] Date of Birth: [birth year]   Medicare Important Message Given:  Yes    Sherald Barge, RN 11/13/2016, 3:45 PM

## 2016-11-13 NOTE — Telephone Encounter (Signed)
Spoke with patients wife. She states he had a stroke Sunday and he is in the hospital. She scheduled him a follow-up with you on 11/21/16. They wanted you aware.

## 2016-11-13 NOTE — Consult Note (Signed)
Luis [name] A. [name], MD     [email or web address]          Luis [name] is an 83 y.o. male.   ASSESSMENT/PLAN: Acute encephalopathy/confusion that is of unclear etiology. The patient does have a tiny left frontal infarct but I am doubtful this is the etiology of the patient's marked encephalopathy. The patient is seems congested and does have COPD at baseline which is meant gotten worse. The rest area issue mass to be the underlying etiology of the patient's encephalopathy. No evidence of UTI. No other metabolic disturbance uncovered.  Baseline cognitive impairment likely due to vascular dementia.  RECOMMENDATION: EEG. Dementia labs including labs for vasculitis given his headaches. CTA of the head and neck. Increase aspirin to 325.  The patient is a 83 year old presents with about a one-week history of confusion which has gotten worse. He previously was evaluated here for stroke. This was a lacunar infarct. The workup shows a tiny infarct doubtful as the etiology of the patient's symptoms. He has been on 81 mg aspirin apparently has been compliant. He also presents with headaches. No dysarthria, numbness, focal numbness or weakness. He has had some shortness of breath especially while being admitted to the hospital. He has a baseline history of COPD. No chest pain. Review of systems otherwise negative although somewhat given his encephalopathy.  GENERAL: The patient's it is mildly dyspneic. He describes congested.  HEENT: Supple. Atraumatic normocephalic.   ABDOMEN: soft  EXTREMITIES: No edema   BACK: Normal.  SKIN: Normal by inspection.    MENTAL STATUS: The patient is awake and alert. He is disoriented however. He is only oriented to name. Speech is nonsensical although there is no dysarthria. He does follow commands.  CRANIAL NERVES: Pupils are it is irregularly shaped and measures about 7 mm and nonreactive, the right is 3 mm and reactive; extra ocular  movements are full, there is no significant nystagmus; visual fields are full; upper and lower facial muscles are normal in strength and symmetric, there is no flattening of the nasolabial folds; tongue is midline; uvula is midline; shoulder elevation is normal.  MOTOR: Normal tone, bulk and strength; no pronator drift.  COORDINATION: Left finger to nose is normal, right finger to nose is normal, No rest tremor; no intention tremor; no postural tremor; no bradykinesia.  REFLEXES: Deep tendon reflexes are symmetrical and normal. Babinski reflexes are flexor bilaterally.   SENSATION: Normal to pain.                [[[[[[[[[[[[[[[[facility] HISTORY OF PRESENT ILLNESS: I had the pleasure of seeing Luis [name] in follow-up in the neurology clinic on 07/11/2016.  The patient was last seen 7 months ago after a small stroke and memory changes. He is again accompanied by his wife and son who help supplement the history today. MMSE in March 2017 was 27/30. Aricept was increased to 17m daily. Since his last visit, he feels his memory is okay. His family denies any worsening, he continues to have short-term memory changes, occasionally repeating himself. He drives locally and denies getting lost. He had to call his wife 2-3 days ago going to a doctor's visit that he had not been to in a long time. He takes his medications without need for reminders. He denies any missed bill payments. No personality changes or hallucinations, no difficulties with ADLs. He has been having poor appetite the past 3 weeks and not a lot of strength. He is noted to have a  productive cough today, which patient and family reports has been ongoing. He denies any headaches, dizziness, diplopia, dysarthria, dysphagia, neck/back pain, focal numbness/tingling/weakness, bowel/bladder dysfunction.  HPI 07/26/15: This is a very pleasant 83 yo RH man with a history of hypertension, hyperlipidemia, diabetes, COPD, who presented  for evaluation of worsening memory and hospital follow-up after recent stroke in October 2016. He feels his memory "could be better," he sometimes has to think when asked a question. His family reports memory changes started over the past year, he would forget names, conversations, ask the same questions. He would misplace things. His wife has recently started to pay attention to make sure he takes his medications, but he denies missing any doses. He continues to balance their checkbook and denies any missed bill payments. Prior to the stroke, he was driving and denied getting lost, but family has noticed his wife was doing more of the driving. He has occasional word-finding difficulties. He denies any difficulties with ADLs. On 07/15/15, they were at a picnic when family noticed that something was "not right," he had rambling speech and was not making sense, starting off a sentence then drifting off. There was no slurred speech or focal weakness noted. He was brought to [facility] where MRI brain showed a small acute infarct in the left external capsule. I personally reviewed MRI brain, in addition there was moderate chronic microvascular disease. Carotid dopplers showed less than 50% stenosis bilaterally, mild plaque at the level of the carotid bulbs and ICA as well as the right common carotid artery. Echocardiogram showed EF 65-70%, normal left atrium. He had been taking low dose aspirin, and was switched to full dose 363m aspirin. Lipid panel showed total cholesterol 164, LDL 88. Simvastatin dose increased to 243mdaily. HbA1c 6.8. He was also diagnosed with hyponatremia with sodium of 128 and hypoxia from COPD exacerbation. On hospital discharge, sodium level was 132. He was back to cognitive baseline. He denies any significant head injuries. No family history of dementia. He has always had a poor sense of smell.   MMSE 24/30 07-2016  Prior 27/30.]]]]]]]]]]]]]]]]        Blood pressure (!) 201/98,  pulse 73, temperature 98.1 F (36.7 C), temperature source Oral, resp. rate 18, height 5' 11"  (1.803 m), weight 166 lb 3.6 oz (75.4 kg), SpO2 94 %.  Past Medical History:  Diagnosis Date  . Arthritis   . COPD (chronic obstructive pulmonary disease) ([place]  . Diabetes mellitus without complication ([facility]  . Dysphagia   . Emphysema   . Em[place]ma of lung ([place]  . Exertional shortness of breath   . GERD (gastroesophageal reflux disease)   . Heart murmur    as a child  . High cholesterol   . Hypertension     Past Surgical History:  Procedure Laterality Date  . CATARACT EXTRACTION W/ INTRAOCULAR LENS IMPLANT Left 2011  . COLONOSCOPY N/A 01/03/2016   Procedure: COLONOSCOPY;  Surgeon: MaAviva SignsMD;  [facility];  Service: Gastroenterology;  Laterality: N/A;  . ESOPHAGEAL DILATION  2000's   x2  . EYE SURGERY    . INGUINAL HERNIA REPAIR Right 1970's  . INGUINAL HERNIA REPAIR Bilateral 03/12/2013   w/mesh bilaterally/notes 03/12/2013  . INGUINAL HERNIA REPAIR Bilateral 03/12/2013   Procedure: HERNIA REPAIR INGUINAL ADULT BILATERAL;  Surgeon: PaMerrie RoofMD;  [place] [facility] Service: General;  Laterality: Bilateral;  . INSERTION OF MESH Bilateral 03/12/2013   Procedure: INSERTION OF  MESH;  Surgeon: Merrie Roof, MD;  Location: [place];  Service: General;  Laterality: Bilateral;  . RETINAL DETACHMENT SURGERY Right 08/29/2010  . TONSILLECTOMY  1940's  . TRANSURETHRAL RESECTION OF PROSTATE  2002; 2013    Family History  Problem Relation Age of Onset  . Stroke Mother   . Healthy Father     Social History:  reports that he quit smoking about 31 years ago. His smoking use included Cigarettes. He has a 70.00 pack-year smoking history. He quit smokeless tobacco use about 19 years ago. His smokeless tobacco use included Chew. He reports that he does not drink alcohol or use drugs.  Allergies:  Allergies  Allergen Reactions  . Tranexamic Acid Anaphylaxis    Had in  09/09/2016 in [facility] ED and had anaphylaxis reaction    Medications: Prior to Admission medications   Medication Sig Start Date End Date Taking? Authorizing Provider  aspirin EC 81 MG tablet Take 81 mg by mouth daily.   Yes Historical Provider, MD  albuterol (PROVENTIL HFA;VENTOLIN HFA) 108 (90 BASE) MCG/ACT inhaler Inhale 2 puffs into the lungs every 6 (six) hours as needed for wheezing or shortness of breath. 09/27/15   Deneise Lever, MD  Cholecalciferol (D3 MAXIMUM STRENGTH) 5000 UNITS capsule Take 5,000 Units by mouth daily.    Historical Provider, MD  donepezil (ARICEPT) 10 MG tablet Take 1 tablet daily 07/11/16   Cameron Sprang, MD  fluticasone furoate-vilanterol (BREO ELLIPTA) 100-25 MCG/INH AEPB Inhale 1 puff into the lungs daily. 09/03/16   Tammy S Parrett, NP  ipratropium-albuterol (DUONEB) 0.5-2.5 (3) MG/3ML SOLN INHALE CONTENTS OF 1 VIAL IN NEBULIZER EVERY 6 HOURS AS NEEDED. 07/10/16   Deneise Lever, MD  metFORMIN (GLUCOPHAGE-XR) 500 MG 24 hr tablet Take 1 tablet by mouth daily. 08/28/16   Historical Provider, MD  nitroGLYCERIN (NITROSTAT) 0.4 MG SL tablet Place 0.4 mg under the tongue every 5 (five) minutes as needed for chest pain. Reported on 03/19/2016    Historical Provider, MD  OXYGEN Inhale into the lungs. On 2 liters during the day and at night    Historical Provider, MD  polyethylene glycol (MIRALAX / GLYCOLAX) packet Take 17 g by mouth daily.    Historical Provider, MD  polyethylene glycol powder (GLYCOLAX/MIRALAX) powder TAKE 17 GRAMS (1 CAPFUL) IN 8 OZ OF FLUID ONCE DAILY 09/26/16   Golden Circle, FNP  Respiratory Therapy Supplies (FLUTTER) DEVI Use as directed 04/18/16   Deneise Lever, MD  simvastatin (ZOCOR) 20 MG tablet TAKE ONE TABLET BY MOUTH DAILY AT 6 PM 11/06/16   Golden Circle, FNP  theophylline (UNIPHYL) 400 MG 24 hr tablet TAKE 1/2 TABLET BY MOUTH THREE TIMES DAILY 04/25/16   Deneise Lever, MD    Scheduled Meds: . aspirin EC  81 mg Oral Daily  .  clopidogrel  75 mg Oral Daily  . donepezil  10 mg Oral QHS  . fluticasone furoate-vilanterol  1 puff Inhalation Daily  . heparin  5,000 Units Subcutaneous Q8H  . polyethylene glycol  17 g Oral Daily  . simvastatin  20 mg Oral q1800  . sodium chloride flush  3 mL Intravenous Q12H   Continuous Infusions: PRN Meds:.acetaminophen, albuterol     Results for orders placed or performed during the hospital encounter of 11/11/16 (from the past 48 hour(s))  Protime-INR     Status: None   Collection Time: 11/11/16 10:54 PM  Result Value Ref Range   Prothrombin Time 13.6  11.4 - 15.2 seconds   INR 1.04   APTT     Status: None   Collection Time: 11/11/16 10:54 PM  Result Value Ref Range   aPTT 27 24 - 36 seconds  CBC     Status: None   Collection Time: 11/11/16 10:54 PM  Result Value Ref Range   WBC 7.4 4.0 - 10.5 K/uL   RBC 4.77 4.22 - 5.81 MIL/uL   Hemoglobin 15.0 13.0 - 17.0 g/dL   HCT 43.8 39.0 - 52.0 %   MCV 91.8 78.0 - 100.0 fL   MCH 31.4 26.0 - 34.0 pg   MCHC 34.2 30.0 - 36.0 g/dL   RDW 12.9 11.5 - 15.5 %   Platelets 242 150 - 400 K/uL  Differential     Status: None   Collection Time: 11/11/16 10:54 PM  Result Value Ref Range   Neutrophils Relative % 63 %   Neutro Abs 4.6 1.7 - 7.7 K/uL   Lymphocytes Relative 22 %   Lymphs Abs 1.6 0.7 - 4.0 K/uL   Monocytes Relative 11 %   Monocytes Absolute 0.8 0.1 - 1.0 K/uL   Eosinophils Relative 4 %   Eosinophils Absolute 0.3 0.0 - 0.7 K/uL   Basophils Relative 0 %   Basophils Absolute 0.0 0.0 - 0.1 K/uL  Comprehensive metabolic panel     Status: Abnormal   Collection Time: 11/11/16 10:54 PM  Result Value Ref Range   Sodium 135 135 - 145 mmol/L   Potassium 3.6 3.5 - 5.1 mmol/L   Chloride 96 (L) 101 - 111 mmol/L   CO2 33 (H) 22 - 32 mmol/L   Glucose, Bld 144 (H) 65 - 99 mg/dL   BUN 15 6 - 20 mg/dL   Creatinine, Ser 1.06 0.61 - 1.24 mg/dL   Calcium 9.7 8.9 - 10.3 mg/dL   Total Protein 6.9 6.5 - 8.1 g/dL   Albumin 3.9 3.5 - 5.0  g/dL   AST 18 15 - 41 U/L   ALT 12 (L) 17 - 63 U/L   Alkaline Phosphatase 82 38 - 126 U/L   Total Bilirubin 0.6 0.3 - 1.2 mg/dL   GFR calc non Af Amer >60 >60 mL/min   GFR calc Af Amer >60 >60 mL/min    Comment: (NOTE) The eGFR has been calculated using the CKD EPI equation. This calculation has not been validated in all clinical situations. eGFR's persistently <60 mL/min signify possible Chronic Kidney Disease.    Anion gap 6 5 - 15  I-stat troponin, ED (not at [facility], [facility])     Status: None   Collection Time: 11/11/16 11:23 PM  Result Value Ref Range   Troponin i, poc 0.00 0.00 - 0.08 ng/mL   Comment 3            Comment: Due to the release kinetics of cTnI, a negative result within the first hours of the onset of symptoms does not rule out myocardial infarction with certainty. If myocardial infarction is still suspected, repeat the test at appropriate intervals.   Urine rapid drug screen (hosp performed)not at [facility]     Status: None   Collection Time: 11/12/16 12:30 AM  Result Value Ref Range   Opiates NONE DETECTED NONE DETECTED   Cocaine NONE DETECTED NONE DETECTED   Benzodiazepines NONE DETECTED NONE DETECTED   Amphetamines NONE DETECTED NONE DETECTED   Tetrahydrocannabinol NONE DETECTED NONE DETECTED   Barbiturates NONE DETECTED NONE DETECTED    Comment:  DRUG SCREEN FOR MEDICAL PURPOSES ONLY.  IF CONFIRMATION IS NEEDED FOR ANY PURPOSE, NOTIFY LAB WITHIN 5 DAYS.        LOWEST DETECTABLE LIMITS FOR URINE DRUG SCREEN Drug Class       Cutoff (ng/mL) Amphetamine      1000 Barbiturate      200 Benzodiazepine   299 Tricyclics       371 Opiates          300 Cocaine          300 THC              50   Urinalysis, Routine w reflex microscopic     Status: None   Collection Time: 11/12/16 12:30 AM  Result Value Ref Range   Color, Urine YELLOW YELLOW   APPearance CLEAR CLEAR   Specific Gravity, Urine 1.017 1.005 - 1.030   pH 6.0 5.0 - 8.0   Glucose, UA NEGATIVE  NEGATIVE mg/dL   Hgb urine dipstick NEGATIVE NEGATIVE   Bilirubin Urine NEGATIVE NEGATIVE   Ketones, ur NEGATIVE NEGATIVE mg/dL   Protein, ur NEGATIVE NEGATIVE mg/dL   Nitrite NEGATIVE NEGATIVE   Leukocytes, UA NEGATIVE NEGATIVE  MRSA PCR Screening     Status: None   Collection Time: 11/12/16  3:45 AM  Result Value Ref Range   MRSA by PCR NEGATIVE NEGATIVE    Comment:        The GeneXpert MRSA Assay (FDA approved for NASAL specimens only), is one component of a comprehensive MRSA colonization surveillance program. It is not intended to diagnose MRSA infection nor to guide or monitor treatment for MRSA infections.   CBC     Status: None   Collection Time: 11/12/16  4:12 AM  Result Value Ref Range   WBC 8.3 4.0 - 10.5 K/uL   RBC 4.69 4.22 - 5.81 MIL/uL   Hemoglobin 14.5 13.0 - 17.0 g/dL   HCT 43.0 39.0 - 52.0 %   MCV 91.7 78.0 - 100.0 fL   MCH 30.9 26.0 - 34.0 pg   MCHC 33.7 30.0 - 36.0 g/dL   RDW 13.0 11.5 - 15.5 %   Platelets 242 150 - 400 K/uL  Creatinine, serum     Status: None   Collection Time: 11/12/16  4:12 AM  Result Value Ref Range   Creatinine, Ser 0.94 0.61 - 1.24 mg/dL   GFR calc non Af Amer >60 >60 mL/min   GFR calc Af Amer >60 >60 mL/min    Comment: (NOTE) The eGFR has been calculated using the CKD EPI equation. This calculation has not been validated in all clinical situations. eGFR's persistently <60 mL/min signify possible Chronic Kidney Disease.   TSH     Status: None   Collection Time: 11/12/16  4:12 AM  Result Value Ref Range   TSH 1.809 0.350 - 4.500 uIU/mL    Comment: Performed by a 3rd Generation assay with a functional sensitivity of <=0.01 uIU/mL.    Studies/Results:  BRAIN MRI FINDINGS: Brain: A punctate focus of restricted diffusion is present within the medial anterior right frontal lobe on image 92 of series 3. Subtle T2 changes are associated.  Extensive diffuse cerebral atrophy and confluent periventricular and subcortical  white matter disease is present bilaterally. No other acute infarct is present. There is no acute hemorrhage. No mass lesion is present.  Ventricles are proportionate to the degree of atrophy. Internal auditory canals are within normal limits. The brainstem and cerebellum are otherwise unremarkable.  Vascular:  The left vertebral artery is occluded. Flow is present within the anterior circulation. Flow is present in the right vertebral artery and basilar artery.  Skull and upper cervical spine: The skullbase is within normal limits. Midline sagittal structures are unremarkable. Degenerative changes are present in the upper cervical spine. Marrow signal is normal.  Sinuses/Orbits: The paranasal sinuses and mastoid air cells are clear. A right lens replacement is present. Scleral banding is present on the right. The globes and orbits are otherwise within normal limits.  IMPRESSION: 1. Punctate area of acute restricted diffusion involving the anteromedial right frontal lobe. 2. Diffuse atrophy and confluent white matter disease likely reflects the sequela of severe age advanced chronic microvascular ischemia.         The brain MRI is reviewed in person. There is a tiny right frontal deep white matter increased signal seen on DWI. It is moderately bright suggestive of moreover a subacute lesion. 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 There is moderate global atrophy. No hemorrhages appreciated. No abnormalities on T1.     CAROTID DOPPLER: IMPRESSION: Mild atherosclerotic disease in the bilateral carotid arteries. Estimated degree of stenosis in the internal carotid arteries is less than 50% bilaterally.  Limited evaluation of the left vertebral artery.     TTE - Left ventricle: The cavity size was normal. Systolic function was   normal. The estimated ejection fraction was in the range of 55%   to 60%. Wall motion was normal; there were no regional  wall   motion abnormalities. Doppler parameters are consistent with   abnormal left ventricular relaxation (grade 1 diastolic   dysfunction). - Mitral valve: Calcified annulus. Mildly thickened leaflets .        Luis [name] Luis [name], M.D.  Diplomate, Tax adviser of Psychiatry and Neurology ( Neurology). 11/13/2016, 7:59 AM

## 2016-11-13 NOTE — Telephone Encounter (Signed)
Patient son Herbie [place] called and states that patient had a stroke and would like to speak to someone please call Remo Lipps (wife ) or robert at [phone]

## 2016-11-14 ENCOUNTER — Telehealth: Payer: Self-pay | Admitting: Family

## 2016-11-14 LAB — RPR: RPR Ser Ql: NONREACTIVE

## 2016-11-14 LAB — HOMOCYSTEINE: Homocysteine: 13 umol/L (ref 0.0–15.0)

## 2016-11-14 LAB — ANTINUCLEAR ANTIBODIES, IFA: ANTINUCLEAR ANTIBODIES, IFA: NEGATIVE

## 2016-11-14 NOTE — Procedures (Signed)
  Luis A. [name], MD     [email or web address]           HISTORY: The patient is an 83 year old man who presents with confusion and altered mental status. The studies being done to evaluate for seizures for cause of the confusion.  MEDICATIONS: Scheduled Meds: Continuous Infusions: PRN Meds:.  Prior to Admission medications   Medication Sig Start Date End Date Taking? Authorizing Provider  albuterol (PROVENTIL HFA;VENTOLIN HFA) 108 (90 BASE) MCG/ACT inhaler Inhale 2 puffs into the lungs every 6 (six) hours as needed for wheezing or shortness of breath. 09/27/15   Deneise Lever, MD  aspirin EC 325 MG EC tablet Take 1 tablet (325 mg total) by mouth daily. 11/14/16   Nita Sells, MD  Cholecalciferol (D3 MAXIMUM STRENGTH) 5000 UNITS capsule Take 5,000 Units by mouth daily.    Historical Provider, MD  clopidogrel (PLAVIX) 75 MG tablet Take 1 tablet (75 mg total) by mouth daily. 11/14/16   Nita Sells, MD  donepezil (ARICEPT) 10 MG tablet Take 1 tablet daily 07/11/16   Cameron Sprang, MD  fluticasone furoate-vilanterol (BREO ELLIPTA) 100-25 MCG/INH AEPB Inhale 1 puff into the lungs daily. 09/03/16   Tammy S Parrett, NP  ipratropium-albuterol (DUONEB) 0.5-2.5 (3) MG/3ML SOLN INHALE CONTENTS OF 1 VIAL IN NEBULIZER EVERY 6 HOURS AS NEEDED. 07/10/16   Deneise Lever, MD  metFORMIN (GLUCOPHAGE-XR) 500 MG 24 hr tablet Take 1 tablet by mouth daily. 08/28/16   Historical Provider, MD  nitroGLYCERIN (NITROSTAT) 0.4 MG SL tablet Place 0.4 mg under the tongue every 5 (five) minutes as needed for chest pain. Reported on 03/19/2016    Historical Provider, MD  OXYGEN Inhale into the lungs. On 2 liters during the day and at night    Historical Provider, MD  polyethylene glycol (MIRALAX / GLYCOLAX) packet Take 17 g by mouth daily.    Historical Provider, MD  polyethylene glycol powder (GLYCOLAX/MIRALAX) powder TAKE 17 GRAMS (1 CAPFUL) IN 8 OZ OF FLUID ONCE DAILY 09/26/16    Golden Circle, FNP      ANALYSIS: A 16 channel recording using standard 10 20 measurements is conducted for 21 minutes. The background activity gases high as 8 Hz. However, the recording shows evidence of generalized frontal intermittent rhythmic delta activity. There is beta activity observing the frontal areas. Rudimentary sleep spindles and K complexes are observed. Photic stimulation and hyperventilation are not conducted. There is no focal or lateral slowing. No epileptiform activities are observed.   IMPRESSION: This recording is mildly abnormal showing occasional frontal intermittent rhythmic delta activity typically seen in toxic metabolic encephalopathy. However, there is no evidence of epileptiform discharges.      [name], M.D.  Diplomate, Tax adviser of Psychiatry and Neurology ( Neurology).

## 2016-11-14 NOTE — Telephone Encounter (Signed)
Pt wife called in and wanted to talk to nurse about a couple of pt meds.  When he was in the hosp, they took him off a couple of meds and she wanted to make sure it was ok   Best number [phone]

## 2016-11-14 NOTE — Telephone Encounter (Signed)
Hospital took pt off of the Simvastatin and theophylline pt was wondering if this is ok with you?

## 2016-11-15 ENCOUNTER — Encounter: Payer: Medicare Other | Admitting: Family

## 2016-11-15 NOTE — Telephone Encounter (Signed)
Patients wife has called back in regard.  Would like to know best time to take aspirin.  Can reach back at [phone].

## 2016-11-19 NOTE — Telephone Encounter (Signed)
Aspirin can be taken at any time of the day. I am not sure why he was taken off the statin. We will recheck to ensure his cholesterol remains stable in 1 month.

## 2016-11-20 ENCOUNTER — Telehealth: Payer: Self-pay | Admitting: Emergency Medicine

## 2016-11-20 NOTE — Telephone Encounter (Signed)
Pt aware.

## 2016-11-20 NOTE — Telephone Encounter (Signed)
Luis [name] called and is requesting verbal orders for OT 1 time a wk for 3 wks for ADL, transfer exercise, health promotion and ok to discharge when goals met or max potential. She was evaluated on the 11/16/16. Please advise thanks.

## 2016-11-21 ENCOUNTER — Encounter: Payer: Self-pay | Admitting: Neurology

## 2016-11-21 ENCOUNTER — Encounter: Payer: Self-pay | Admitting: Family

## 2016-11-21 ENCOUNTER — Ambulatory Visit (INDEPENDENT_AMBULATORY_CARE_PROVIDER_SITE_OTHER): Payer: Medicare Other | Admitting: Neurology

## 2016-11-21 ENCOUNTER — Ambulatory Visit (INDEPENDENT_AMBULATORY_CARE_PROVIDER_SITE_OTHER): Payer: Medicare Other | Admitting: Family

## 2016-11-21 VITALS — BP 132/78 | HR 76 | Ht 71.0 in | Wt 171.1 lb

## 2016-11-21 VITALS — BP 144/80 | HR 50 | Temp 97.7°F | Resp 16 | Ht 71.0 in | Wt 171.0 lb

## 2016-11-21 DIAGNOSIS — E119 Type 2 diabetes mellitus without complications: Secondary | ICD-10-CM | POA: Diagnosis not present

## 2016-11-21 DIAGNOSIS — F0150 Vascular dementia without behavioral disturbance: Secondary | ICD-10-CM | POA: Diagnosis not present

## 2016-11-21 DIAGNOSIS — I639 Cerebral infarction, unspecified: Secondary | ICD-10-CM

## 2016-11-21 DIAGNOSIS — I10 Essential (primary) hypertension: Secondary | ICD-10-CM

## 2016-11-21 MED ORDER — ATORVASTATIN CALCIUM 80 MG PO TABS: 80.0000 mg | ORAL_TABLET | Freq: Every day | ORAL | 3 refills | Status: DC

## 2016-11-21 MED ORDER — CLOPIDOGREL BISULFATE 75 MG PO TABS: 75.0000 mg | ORAL_TABLET | Freq: Every day | ORAL | 2 refills | Status: DC

## 2016-11-21 MED ORDER — DONEPEZIL HCL 10 MG PO TABS: ORAL_TABLET | ORAL | 3 refills | Status: DC

## 2016-11-21 NOTE — Patient Instructions (Signed)
1. Continue aspirin and Plavix 2. Follow-up with PCP for cholesterol 3. Continue Aricept 10mg  daily 4. No further driving 5. Follow-up in 6 months

## 2016-11-21 NOTE — Assessment & Plan Note (Signed)
New CVA with risk factors including Type 2 diabetes, hypertension and hyperlipidemia. Recently changed simvastatin to atorvastatin for increased LDL with goal of <70. Maintained on Plavix and aspirin for risk reduction with no adverse side effects or nuisance history.Continue to work with physical and occupational therapy. Occupation therapy requesting handrails in shower for safety with prescription written. Follow up in one month or sooner if needed.

## 2016-11-21 NOTE — Progress Notes (Signed)
Subjective:    Patient ID: Luis [name], male    DOB: Sep 23, 1934, 83 y.o.   MRN: [id number]  Chief Complaint  Patient presents with  . Hospitalization Follow-up    Theophylline and simvastatin he was taken off of by hospital, plavix refill? rx for bars for shower so they can take it to [employer]     HPI:  Luis [name] is a 83 y.o. male who  has a past medical history of Arthritis; COPD (chronic obstructive pulmonary disease) ([place]); Diabetes mellitus without complication ([place]); Dysphagia; Emphysema; Emphysema of lung ([place]); Exertional shortness of breath; GERD (gastroesophageal reflux disease); Heart murmur; High cholesterol; and Hypertension. and presents today for a hospitalization follow up.  Recently evaluated in the emergency department and admitted to the hospital for evaluation of sudden onset "throbbing" frontal headache and confusion that began approximately 3 hours prior to presentation in the emergency department. He was noted to be confused and wobbly throughout the day. On physical exam he was noted to move all extremities well with his left grip mildly weaker and no pronator drift. He was mildly confused. CT scan of the head showed no acute intracranial abnormalities and chronic atrophy and small vessel ischemic changes.  MRI with concern for acute right frontal infarct. Repeat echocardiogram showed ejection fraction of 55-60% and grade 1 diastolic dysfunction. Carotid Doppler showed no clots and less than 50% stenosis. Statin was increased to Lipitor 80 from Zocor 20. He was started on Plavix. Recommended home health physical therapy. All hospital records, labs, and imaging reviewed in detail.  Since leaving the hospital he continues to have short-term memory issues. He is using a hospital bed at home for safety as he traditionally has an elevated bed with concern for falls. Continues to work with physical and occupational therapy. It is recommend shower bars at home to  help with safety. Eating and drinking well. Able to complete activities of daily living with some assistance. Reports taking his medications as prescribed and denies adverse side effects. Does have concern regarding stopping theophyliine and simvastatin.    Lab Results  Component Value Date   CHOL 164 07/16/2015   HDL 49 07/16/2015   LDLCALC 88 07/16/2015   TRIG 137 07/16/2015   CHOLHDL 3.3 07/16/2015    Allergies  Allergen Reactions  . Tranexamic Acid Anaphylaxis    Had in 09/09/2016 in [facility] ED and had anaphylaxis reaction      Outpatient Medications Prior to Visit  Medication Sig Dispense Refill  . albuterol (PROVENTIL HFA;VENTOLIN HFA) 108 (90 BASE) MCG/ACT inhaler Inhale 2 puffs into the lungs every 6 (six) hours as needed for wheezing or shortness of breath. 1 Inhaler 11  . aspirin EC 325 MG EC tablet Take 1 tablet (325 mg total) by mouth daily. 30 tablet 0  . Cholecalciferol (D3 MAXIMUM STRENGTH) 5000 UNITS capsule Take 5,000 Units by mouth daily.    [name] donepezil (ARICEPT) 10 MG tablet Take 1 tablet daily 90 tablet 3  . fluticasone furoate-vilanterol (BREO ELLIPTA) 100-25 MCG/INH AEPB Inhale 1 puff into the lungs daily. 1 each 5  . ipratropium-albuterol (DUONEB) 0.5-2.5 (3) MG/3ML SOLN INHALE CONTENTS OF 1 VIAL IN NEBULIZER EVERY 6 HOURS AS NEEDED. 90 mL 11  . metFORMIN (GLUCOPHAGE-XR) 500 MG 24 hr tablet Take 1 tablet by mouth daily.    . nitroGLYCERIN (NITROSTAT) 0.4 MG SL tablet Place 0.4 mg under the tongue every 5 (five) minutes as needed for chest pain. Reported on 03/19/2016    .  OXYGEN Inhale into the lungs. On 2 liters during the day and at night    . polyethylene glycol (MIRALAX / GLYCOLAX) packet Take 17 g by mouth daily.    . polyethylene glycol powder (GLYCOLAX/MIRALAX) powder TAKE 17 GRAMS (1 CAPFUL) IN 8 OZ OF FLUID ONCE DAILY 527 g 2  . clopidogrel (PLAVIX) 75 MG tablet Take 1 tablet (75 mg total) by mouth daily. 30 tablet 0   No facility-administered medications  prior to visit.       Past Surgical History:  Procedure Laterality Date  . CATARACT EXTRACTION W/ INTRAOCULAR LENS IMPLANT Left 2011  . COLONOSCOPY N/A 01/03/2016   Procedure: COLONOSCOPY;  Surgeon: Aviva Signs, MD;  Location[facility];  Service: Gastroenterology;  Laterality: N/A;  . ESOPHAGEAL DILATION  2000's   x2  . EYE SURGERY    . INGUINAL HERNIA REPAIR Right 1970's  . INGUINAL HERNIA REPAIR Bilateral 03/12/2013   w/mesh bilaterally/notes 03/12/2013  . INGUINAL HERNIA REPAIR Bilateral 03/12/2013   Procedure: HERNIA REPAIR INGUINAL ADULT BILATERAL;  Surgeon: Merrie Roof, MD;  Location: [place];  Service: General;  Laterality: Bilateral;  . INSERTION OF MESH Bilateral 03/12/2013   Procedure: INSERTION OF MESH;  Surgeon: Merrie Roof, MD;  Location: [place];  Service: General;  Laterality: Bilateral;  . RETINAL DETACHMENT SURGERY Right 08/29/2010  . TONSILLECTOMY  1940's  . TRANSURETHRAL RESECTION OF PROSTATE  2002; 2013      Past Medical History:  Diagnosis Date  . Arthritis   . COPD (chronic obstructive pulmonary disease) ([place])   . Diabetes mellitus without complication ([place])   . Dysphagia   . Emphysema   . Emphysema of lung ([place])   . Exertional shortness of breath   . GERD (gastroesophageal reflux disease)   . Heart murmur    as a child  . High cholesterol   . Hypertension       Review of Systems  Constitutional: Negative for chills and fever.  Eyes:       Negative for changes in vision  Respiratory: Negative for cough, chest tightness, shortness of breath and wheezing.   Cardiovascular: Negative for chest pain, palpitations and leg swelling.  Endocrine: Negative for polydipsia, polyphagia and polyuria.  Neurological: Negative for dizziness, weakness, light-headedness and headaches.      Objective:    BP (!) 144/80 (BP Location: Left Arm, Patient Position: Sitting, Cuff Size: Normal)   Pulse (!) 50   Temp 97.7 F (36.5 C) (Oral)   Resp 16   Ht 5'  11" (1.803 m)   Wt 171 lb (77.6 kg)   SpO2 93%   BMI 23.85 kg/m  Nursing note and vital signs reviewed.  Physical Exam  Constitutional: He is oriented to person, place, and time. He appears well-developed and well-nourished. No distress.  Eyes: Conjunctivae and EOM are normal. Pupils are equal, round, and reactive to light.  Cardiovascular: Normal rate, regular rhythm, normal heart sounds and intact distal pulses.   Pulmonary/Chest: Effort normal and breath sounds normal.  Neurological: He is alert and oriented to person, place, and time. He has normal reflexes. No cranial nerve deficit.  Pronator drift negative.  Skin: Skin is warm and dry.  Psychiatric: He has a normal mood and affect. His behavior is normal. Judgment and thought content normal.       Assessment & Plan:   Problem List Items Addressed This Visit      Cardiovascular and Mediastinum   Essential hypertension  Blood pressure slightly elevated today above goal of 140/90 with current regimen and no adverse side effects. Denies worst headache of life with no new symptoms of end organ damage noted on physical exam. Continue to monitor blood pressure at home and if readings remain elevated start medication.       Relevant Medications   atorvastatin (LIPITOR) 80 MG tablet   Cerebrovascular accident (CVA) ([place]) - Primary    New CVA with risk factors including Type 2 diabetes, hypertension and hyperlipidemia. Recently changed simvastatin to atorvastatin for increased LDL with goal of <70. Maintained on Plavix and aspirin for risk reduction with no adverse side effects or nuisance history.Continue to work with physical and occupational therapy. Occupation therapy requesting handrails in shower for safety with prescription written. Follow up in one month or sooner if needed.       Relevant Medications   clopidogrel (PLAVIX) 75 MG tablet   atorvastatin (LIPITOR) 80 MG tablet     Endocrine   Diabetes mellitus ([place])     Appear adequately controlled with current medication regimen and followed by endocrinology. Continue current dosage of metformin with changes and follow up per endocrinology.      Relevant Medications   atorvastatin (LIPITOR) 80 MG tablet       I am having Mr. Haslett start on atorvastatin. I am also having him maintain his polyethylene glycol, nitroGLYCERIN, Cholecalciferol, albuterol, OXYGEN, ipratropium-albuterol, fluticasone furoate-vilanterol, metFORMIN, polyethylene glycol powder, aspirin, donepezil, and clopidogrel.   Meds ordered this encounter  Medications  . clopidogrel (PLAVIX) 75 MG tablet    Sig: Take 1 tablet (75 mg total) by mouth daily.    Dispense:  30 tablet    Refill:  2    Order Specific Question:   Supervising Provider    Answer:   Pricilla Holm A [id number]  . atorvastatin (LIPITOR) 80 MG tablet    Sig: Take 1 tablet (80 mg total) by mouth daily.    Dispense:  90 tablet    Refill:  3    Order Specific Question:   Supervising Provider    Answer:   Pricilla Holm A [id number]     Follow-up: Return if symptoms worsen or fail to improve.  Mauricio Po, FNP

## 2016-11-21 NOTE — Assessment & Plan Note (Signed)
Appear adequately controlled with current medication regimen and followed by endocrinology. Continue current dosage of metformin with changes and follow up per endocrinology.

## 2016-11-21 NOTE — Patient Instructions (Addendum)
Thank you for choosing [facility].  SUMMARY AND INSTRUCTIONS:  Start the atorvastatin.  Continue with the plavix.   Follow up in 1 month fasting.  Medication:  Your prescription(s) have been submitted to your pharmacy or been printed and provided for you. Please take as directed and contact our office if you believe you are having problem(s) with the medication(s) or have any questions.  Follow up:  If your symptoms worsen or fail to improve, please contact our office for further instruction, or in case of emergency go directly to the emergency room at the closest medical facility.

## 2016-11-21 NOTE — Progress Notes (Signed)
NEUROLOGY FOLLOW UP OFFICE NOTE  Luis [name] [id number]  HISTORY OF PRESENT ILLNESS: I had the pleasure of seeing Luis [name] in follow-up in the neurology clinic on 11/21/2016.  The patient was last seen 4 months ago after a small stroke and memory changes. He is again accompanied by his wife and son who help supplement the history today. MMSE in October 2017 was 24/30 (27/30 in March 2017). He is now on Aricept 72m daily. His wife reports  Aricept was increased to 179mdaily. Since his last visit, [facility] His wife reports that he had more confusion that baseline. He was watching the Super Bowl when she noticed he was rubbing his forehead and she asked if he had a headache. He stood up to go to the bathroom and could not find it. Since similar symptoms occurred with prior stroke, his [facility] which I personally reviewed, there was a punctate focus of restricted diffusion in the medial anterior right frontal lobe. There was diffuse atrophy and severe age-advanced chronic microvascular disease with confluent white matter changes seen. Stroke workup was unremarkable with echocardiogram showing EF 5553-97%grade 1 diastolic dysfunction, normal left atrium. Carotid dopplers did not show any significant stenosis, estimated less than 50% bilaterally. He was evaluated by neurologist Dr. DoMerlene Laughterit was doubtful that the tiny frontal infarct was the cause of his marked encephalopathy. His EEG showed mild diffuse slowing. Bloodwork for vasculitis showed normal ESR, CRP, homocysteine, RPR, ANA. Normal TSH and B12. He was discharged home on aspirin 32549maily and Plavix. His statin was discontinued to unclear reasons, he is due for repeat lipid testing next month. Since discharge home, his wife reports he is still not at baseline. He does not recall much of his hospital stay. His son reports he was trying to answer the remote control  when the phone rang. He thinks they just went on a trip and tell his wife she must be tired from driving all night, or would talk about being out in the ice. She is always looking for things he misplaced. The other night he went to bed without changing his clothes. She took over bill payments a few months ago because he could not keep up or pay on time, he paid double one time. She puts out his pills for him, he would forget where they are kept. He is a little more aggressive, with some paranoia, thinking this morning that they were going to "put him in some place."   He denies any headaches, dizziness, diplopia, dysarthria, dysphagia, neck/back pain, focal numbness/tingling/weakness, bowel/bladder dysfunction.  Diagnostic Data: Brain MRA did not show any branch occlusion or flow limiting stenosis to explain the recent small frontal infarct. There was subtle but convincing delayed opacification of the left vertebral artery, correlating with previous Doppler findings. This could be related to stenosis at the origin, but the lumen is intermittently and asymmetrically irregular, remote dissection is also considered. Left vertebral findings have an overall chronic appearance as noted above. The dominant right vertebral artery is widely patent to the basilar. Advanced chronic microvascular disease in the cerebral white matter. Bilateral PCA atheromatous narrowings, mild on the left and advanced on the right.  HPI 07/26/15: This is a very pleasant [age] RH man with a history of hypertension, hyperlipidemia, diabetes, COPD, who presented for evaluation of worsening memory and hospital follow-up after recent stroke in October 2016.  He feels his memory "could be better," he sometimes has to think when asked a question. His family reports memory changes started over the past year, he would forget names, conversations, ask the same questions. He would misplace things. His wife has recently started to pay attention to  make sure he takes his medications, but he denies missing any doses. He continues to balance their checkbook and denies any missed bill payments. Prior to the stroke, he was driving and denied getting lost, but family has noticed his wife was doing more of the driving. He has occasional word-finding difficulties. He denies any difficulties with ADLs. On 07/15/15, they were at a picnic when family noticed that something was "not right," he had rambling speech and was not making sense, starting off a sentence then drifting off. There was no slurred speech or focal weakness noted. He was brought to [facility] where MRI brain showed a small acute infarct in the left external capsule. I personally reviewed MRI brain, in addition there was moderate chronic microvascular disease. Carotid dopplers showed less than 50% stenosis bilaterally, mild plaque at the level of the carotid bulbs and ICA as well as the right common carotid artery. Echocardiogram showed EF 65-70%, normal left atrium. He had been taking low dose aspirin, and was switched to full dose 355m aspirin. Lipid panel showed total cholesterol 164, LDL 88. Simvastatin dose increased to 226mdaily. HbA1c 6.8. He was also diagnosed with hyponatremia with sodium of 128 and hypoxia from COPD exacerbation. On hospital discharge, sodium level was 132. He was back to cognitive baseline. He denies any significant head injuries. No family history of dementia. He has always had a poor sense of smell.   PAST MEDICAL HISTORY: Past Medical History:  Diagnosis Date  . Arthritis   . COPD [place] disease) ([place]  . Diabetes mellitus without complication ([place]  . Dysphagia   . Emphysema   . Em[place]sema of lung ([place]  . Exertional shortness of breath   . GERD (gastroesophageal reflux disease)   . Heart murmur    as a child  . High cholesterol   . Hypertension     MEDICATIONS: Current Outpatient Prescriptions on File Prior to Visit    Medication Sig Dispense Refill  . albuterol (PROVENTIL HFA;VENTOLIN HFA) 108 (90 BASE) MCG/ACT inhaler Inhale 2 puffs into the lungs every 6 (six) hours as needed for wheezing or shortness of breath. 1 Inhaler 11  . aspirin EC 325 MG EC tablet Take 1 tablet (325 mg total) by mouth daily. 30 tablet 0  . Cholecalciferol (D3 MAXIMUM STRENGTH) 5000 UNITS capsule Take 5,000 Units by mouth daily.    . clopidogrel (PLAVIX) 75 MG tablet Take 1 tablet (75 mg total) by mouth daily. 30 tablet 0  . donepezil (ARICEPT) 10 MG tablet Take 1 tablet daily 90 tablet 3  . fluticasone furoate-vilanterol (BREO ELLIPTA) 100-25 MCG/INH AEPB Inhale 1 puff into the lungs daily. 1 each 5  . ipratropium-albuterol (DUONEB) 0.5-2.5 (3) MG/3ML SOLN INHALE CONTENTS OF 1 VIAL IN NEBULIZER EVERY 6 HOURS AS NEEDED. 90 mL 11  . metFORMIN (GLUCOPHAGE-XR) 500 MG 24 hr tablet Take 1 tablet by mouth daily.    . nitroGLYCERIN (NITROSTAT) 0.4 MG SL tablet Place 0.4 mg under the tongue every 5 (five) minutes as needed for chest pain. Reported on 03/19/2016    . OXYGEN Inhale into the lungs. On 2 liters during the day and at night    .  polyethylene glycol (MIRALAX / GLYCOLAX) packet Take 17 g by mouth daily.    . polyethylene glycol powder (GLYCOLAX/MIRALAX) powder TAKE 17 GRAMS (1 CAPFUL) IN 8 OZ OF FLUID ONCE DAILY 527 g 2   No current facility-administered medications on file prior to visit.     ALLERGIES: Allergies  Allergen Reactions  . Tranexamic Acid Anaphylaxis    Had in 09/09/2016 in [facility] ED and had anaphylaxis reaction    FAMILY HISTORY: Family History  Problem Relation Age of Onset  . Stroke Mother   . Healthy Father     SOCIAL HISTORY: Social History   Social History  . Marital status: Married    Spouse name: N/A  . Number of children: 3  . Years of education: 12   Occupational History  . Retired    Social History Main Topics  . Smoking status: Former Smoker    Packs/day: 2.00    Years: 35.00     Types: Cigarettes    Quit date: 10/08/1985  . Smokeless tobacco: Former Systems developer    Types: Chew    Quit date: 10/08/1997  . Alcohol use No  . Drug use: No  . Sexual activity: Not Currently   Other Topics Concern  . Not on file   Social History Narrative   Fun: Golf   Denies abuse and feels safe at home.     REVIEW OF SYSTEMS: Constitutional: No fevers, chills, or sweats, no generalized fatigue, change in appetite Eyes: No visual changes, double vision, eye pain Ear, nose and throat: No hearing loss, ear pain, nasal congestion, sore throat Cardiovascular: No chest pain, palpitations Respiratory:  No shortness of breath at rest or with exertion, wheezes GastrointestinaI: No nausea, vomiting, diarrhea, abdominal pain, fecal incontinence Genitourinary:  No dysuria, urinary retention or frequency Musculoskeletal:  No neck pain, back pain Integumentary: No rash, pruritus, skin lesions Neurological: as above Psychiatric: No depression, insomnia, anxiety Endocrine: No palpitations, fatigue, diaphoresis, mood swings, change in appetite, change in weight, increased thirst Hematologic/Lymphatic:  No anemia, purpura, petechiae. Allergic/Immunologic: no itchy/runny eyes, nasal congestion, recent allergic reactions, rashes  PHYSICAL EXAM: Vitals:   11/21/16 0905  BP: 132/78  Pulse: 76   General: No acute distress Head:  Normocephalic/atraumatic Neck: supple, no paraspinal tenderness, full range of motion Heart:  Regular rate and rhythm Lungs:  Clear to auscultation bilaterally Back: No paraspinal tenderness Skin/Extremities: No rash, no edema Neurological Exam: alert and oriented to person, place, and month/day of week (states it is 11/20/11, it is 11/21/16). No aphasia or dysarthria. Fund of knowledge is appropriate.  Remote memory intact.  Attention and concentration are normal.    Able to name objects and repeat phrases. CDT 5/5. Wrote "today is a beautiful day" but misspelled  "beautiful" MMSE - Mini Mental State Exam 11/21/2016 07/11/2016 12/28/2015  Orientation to time 3 3 5   Orientation to Place 5 5 5   Registration 3 3 3   Attention/ Calculation 5 4 5   Recall 0 0 0  Language- name 2 objects 2 2 2   Language- repeat 1 1 1   Language- follow 3 step command 3 3 3   Language- read & follow direction 1 1 1   Write a sentence 1 1 1   Copy design 1 1 1   Total score 25 24 27    Cranial nerves: Pupils equal, round, reactive to light.  Extraocular movements intact with no nystagmus. Visual fields full. Facial sensation intact. No facial asymmetry. Tongue, uvula, palate midline.  Motor: Fasciculations noted on the left  thenar muscles. Bulk and tone normal, muscle strength 5/5 throughout with no pronator drift.  Sensation to light touch intact.  No extinction to double simultaneous stimulation.  Deep tendon reflexes +2 throughout, toes downgoing.  Finger to nose testing intact.  Gait narrow-based and steady, able to tandem walk adequately.  Romberg negative.  IMPRESSION: This is a pleasant 83 yo RH man with a history of hypertension, hyperlipidemia, diabetes, COPD, with worsening memory and small left subcortical stroke last 07/15/15, now with punctate infarct in the medial anterior right frontal lobe last 11/11/16. His MRI brain shows severe chronic microvascular disease. The stroke could be due to small vessel disease, echo and carotid dopplers unremarkable. Vasculitis is also considered, bloodwork normal. At this point, recommend maximum medical management with aspirin and Plavix, lipid and BP control. He will have lipid panel rechecked in a month, goal LDL <70. His wife reports worsening cognition, MMSE today 25/30 (24/30 in October 2017), symptoms suggestive of vascular dementia, continue Aricept 23m daily. We discussed recommendations of no further driving. We again discussed control of vascular risk factors and daily aspirin, as well as physical exercise and brain stimulation  exercises for brain health. We discussed home safety. He will follow-up in 6 months and knows to call our office for any problems.   Thank you for allowing me to participate in his care.  Please do not hesitate to call for any questions or concerns.  The duration of this appointment visit was 25 minutes of face-to-face time with the patient.  Greater than 50% of this time was spent in counseling, explanation of diagnosis, planning of further management, and coordination of care.   KEllouise Newer M.D.   CC: GMauricio Po FNP

## 2016-11-21 NOTE — Telephone Encounter (Signed)
Gave verbal ok per Greg. 

## 2016-11-21 NOTE — Assessment & Plan Note (Signed)
Blood pressure slightly elevated today above goal of 140/90 with current regimen and no adverse side effects. Denies worst headache of life with no new symptoms of end organ damage noted on physical exam. Continue to monitor blood pressure at home and if readings remain elevated start medication.

## 2016-11-29 ENCOUNTER — Telehealth: Payer: Self-pay | Admitting: Neurology

## 2016-11-29 NOTE — Telephone Encounter (Signed)
Luis [name] 08/14/1934 His son and wife called needing a paper filled out stating that he is not in sound mind to be making decisions. The son Gretchen Portela) [phone]  said they had just left the lawyers office. His wife's name is Chueyee Koeppel # [phone]. Thank you

## 2016-11-29 NOTE — Telephone Encounter (Signed)
Please advise 

## 2016-11-30 ENCOUNTER — Encounter: Payer: Self-pay | Admitting: Neurology

## 2016-11-30 NOTE — Telephone Encounter (Signed)
Patients son notified letter complete and ready for pick up.

## 2016-11-30 NOTE — Telephone Encounter (Signed)
Done, thanks

## 2016-12-06 ENCOUNTER — Encounter: Payer: Self-pay | Admitting: Internal Medicine

## 2016-12-06 ENCOUNTER — Ambulatory Visit (INDEPENDENT_AMBULATORY_CARE_PROVIDER_SITE_OTHER): Payer: Medicare Other | Admitting: Internal Medicine

## 2016-12-06 DIAGNOSIS — J9611 Chronic respiratory failure with hypoxia: Secondary | ICD-10-CM

## 2016-12-06 DIAGNOSIS — J449 Chronic obstructive pulmonary disease, unspecified: Secondary | ICD-10-CM | POA: Diagnosis not present

## 2016-12-06 MED ORDER — FLUTTER DEVI: 0 refills | Status: AC

## 2016-12-06 NOTE — Progress Notes (Signed)
HPI male former smoker followed for severe COPD/emphysema, vasomotor rhinitis PFT2/5/14- severe obstructive airways disease with slight response to bronchodilator. Air-trapping with increased residual volume. Diffusion mildly reduced. Emphysema pattern. FVC 2.56/59%, FEV1 1.17/41%, FEV1/FVC 0.45. Residual volume 132%, DLCO 78%. 6MWT-11/12/12- 96%, 94%, 96%, 454 m. Good distance with oxygenation well maintained. a1AT- WNL MM 142  6 minute walk test-10/20/15--saturation dropped to 89% on room air by end of walk, 412 m. Borderline but not qualifying for portable O2. Resolving acute bronchitis after doxycycline and Mucinex. Still frequent wheeze. Walk Test on room air 04/18/2016-qualified for portable oxygen with desaturation to 86% and appropriate recovery on 2 L oxygen. Denies cough with meals, eats slowly. ---------------------------------------------------------------------------  04/18/2016-83 year old male former smoker followed for COPD mixed type, vasomotor rhinitis, complicated by CVA/Vascular Dementia/mild cognitive impairment, DM O2 2 L sleep/[facility] FOLLOWS FOR: Pt has wet cough-wheezing and chest congestion. CXR was done recently to check for PNA-was put on prednisone and did not help.  Walk Test on room air 04/18/2016-qualified for portable oxygen with desaturation to 86% and appropriate recovery on 2 L oxygen. Denies cough with meals, eats slowly. Persistent raspy wheezy cough. Dyspnea with exertion, comfortable at rest and sleeping on O2. Did not notice any benefit from Proventil rescue inhaler or Breo. CXR 03/19/2016-NAD with emphysema changes, normal heart size  12/06/2016- 74 yo - male former smoker followed for COPD mixed type, vasomotor rhinitis, complicated by CVA/mild cognitive impairment, DM O2 2 L sleep/[facility] FOLLOWS FOR: pt c/o chest congestion, stable dyspnea.  pt states that flutter valve is not longer working, which helped to produce mucus.  Flutter device stop working for  them and they need a replacement. Breo device hard for him to use after another stroke. Nebulizer works better. Has a portable oxygen concentrator. Not wearing oxygen for sleep currently after 2 episodes of significant epistaxis requiring ENT evaluation and cautery with follow-up pending. He has humidifier on his home concentrator.  CXR 07/13/2016- IMPRESSION: Chronic bronchitic changes. No alveolar pneumonia, CHF, nor other acute cardiopulmonary abnormality  ROS-see HPI Constitutional:   No-   weight loss, night sweats, fevers, chills, fatigue, lassitude. HEENT:   No-  headaches, difficulty swallowing, tooth/dental problems, sore throat,       No-  sneezing, itching, ear ache, nasal congestion, post nasal drip,  CV:  No-   chest pain, orthopnea, PND, swelling in lower extremities, anasarca,                                                                            dizziness, palpitations Resp: +shortness of breath with exertion or at rest.             +productive cough,  No non-productive cough,  No- coughing up of blood.              No-   change in color of mucus.  + wheezing.   Skin: No-   rash or lesions. GI:  No-   heartburn, indigestion, abdominal pain, nausea, vomiting, GU: . MS:  No-   joint pain or swelling.  . Neuro-     nothing unusual Psych:  No- change in mood or affect. No depression or anxiety.  No memory loss.  OBJ-  Physical Exam General- Alert, Oriented, Affect-appropriate, Distress- none acute, trim Skin- rash-none, lesions- none, excoriation- none Lymphadenopathy- none Head- atraumatic            Eyes- Gross vision intact, PERRLA, conjunctivae and secretions clear            Ears- Hearing, canals-normal            Nose- Clear, no-Septal dev, mucus, polyps, erosion, perforation             Throat- Mallampati II , mucosa clear , drainage- none, tonsils- atrophic Neck- flexible , trachea midline, no stridor , thyroid nl, carotid no bruit Chest - symmetrical  excursion , unlabored           Heart/CV- RRR , no murmur , no gallop  , no rub, nl s1 s2                           - JVD- none , edema- none, stasis changes- none, varices- none           Lung-  +diminished, wheeze- none, cough + rattling "wet" upper airway cough, dullness-none, rub- none           Chest wall-  Abd-  Br/ Gen/ Rectal- Not done, not indicated Extrem- cyanosis- none, clubbing, none, atrophy- none, strength- nl Neuro- + nonfocal sitting quietly in exam room. Speech seems normal during simple conversation. Wife volunteers a lot of information.

## 2016-12-06 NOTE — Patient Instructions (Addendum)
Ok to use up [employer]. Then leave it off and see if it was making any usefull difference with your breathing.  Ok to use the nebulizer machine with DuoNeb twice daily on a routine basis  Order- Flutter device    Script is printed    Blow through 4 times per set, 3 sets daily as needed to help  Clear your chest  Please call as needed

## 2016-12-07 NOTE — Assessment & Plan Note (Signed)
They're following up with ENT to address his problem with recurrent epistaxis aggravated by drying from his nasal oxygen.

## 2016-12-07 NOTE — Assessment & Plan Note (Signed)
Chronic bronchitis with difficulty clearing secretions. His flutter device apparently damaged. Plan-we discussed use of nebulizer machine twice daily maintenance instead of using Breo. Replace flutter device.

## 2016-12-13 ENCOUNTER — Ambulatory Visit (INDEPENDENT_AMBULATORY_CARE_PROVIDER_SITE_OTHER): Payer: Medicare Other | Admitting: Ophthalmology

## 2016-12-13 DIAGNOSIS — H35033 Hypertensive retinopathy, bilateral: Secondary | ICD-10-CM | POA: Diagnosis not present

## 2016-12-13 DIAGNOSIS — E11319 Type 2 diabetes mellitus with unspecified diabetic retinopathy without macular edema: Secondary | ICD-10-CM

## 2016-12-13 DIAGNOSIS — E113293 Type 2 diabetes mellitus with mild nonproliferative diabetic retinopathy without macular edema, bilateral: Secondary | ICD-10-CM | POA: Diagnosis not present

## 2016-12-13 DIAGNOSIS — H43812 Vitreous degeneration, left eye: Secondary | ICD-10-CM | POA: Diagnosis not present

## 2016-12-13 DIAGNOSIS — H338 Other retinal detachments: Secondary | ICD-10-CM

## 2016-12-13 DIAGNOSIS — I10 Essential (primary) hypertension: Secondary | ICD-10-CM

## 2016-12-19 ENCOUNTER — Encounter: Payer: Self-pay | Admitting: Family

## 2016-12-19 ENCOUNTER — Other Ambulatory Visit (INDEPENDENT_AMBULATORY_CARE_PROVIDER_SITE_OTHER): Payer: Medicare Other

## 2016-12-19 ENCOUNTER — Ambulatory Visit (INDEPENDENT_AMBULATORY_CARE_PROVIDER_SITE_OTHER): Payer: Medicare Other | Admitting: Family

## 2016-12-19 VITALS — BP 132/82 | HR 54 | Temp 97.8°F | Resp 16 | Ht 71.0 in | Wt 165.0 lb

## 2016-12-19 DIAGNOSIS — Z0000 Encounter for general adult medical examination without abnormal findings: Secondary | ICD-10-CM

## 2016-12-19 DIAGNOSIS — E782 Mixed hyperlipidemia: Secondary | ICD-10-CM

## 2016-12-19 DIAGNOSIS — Z0001 Encounter for general adult medical examination with abnormal findings: Secondary | ICD-10-CM | POA: Insufficient documentation

## 2016-12-19 DIAGNOSIS — R350 Frequency of micturition: Secondary | ICD-10-CM | POA: Diagnosis not present

## 2016-12-19 DIAGNOSIS — I63239 Cerebral infarction due to unspecified occlusion or stenosis of unspecified carotid arteries: Secondary | ICD-10-CM

## 2016-12-19 DIAGNOSIS — Z125 Encounter for screening for malignant neoplasm of prostate: Secondary | ICD-10-CM | POA: Diagnosis not present

## 2016-12-19 DIAGNOSIS — I10 Essential (primary) hypertension: Secondary | ICD-10-CM

## 2016-12-19 DIAGNOSIS — F0150 Vascular dementia without behavioral disturbance: Secondary | ICD-10-CM | POA: Diagnosis not present

## 2016-12-19 DIAGNOSIS — Z23 Encounter for immunization: Secondary | ICD-10-CM | POA: Diagnosis not present

## 2016-12-19 DIAGNOSIS — E119 Type 2 diabetes mellitus without complications: Secondary | ICD-10-CM | POA: Diagnosis not present

## 2016-12-19 LAB — LIPID PANEL
CHOLESTEROL: 129 mg/dL (ref 0–200)
HDL: 50.5 mg/dL (ref >39.00)
LDL Cholesterol: 64 mg/dL (ref 0–99)
NonHDL: 78.04
TRIGLYCERIDES: 69 mg/dL (ref 0.0–149.0)
Total CHOL/HDL Ratio: 3
VLDL: 13.8 mg/dL (ref 0.0–40.0)

## 2016-12-19 LAB — COMPREHENSIVE METABOLIC PANEL
ALBUMIN: 4.4 g/dL (ref 3.5–5.2)
ALT: 17 U/L (ref 0–53)
AST: 20 U/L (ref 0–37)
Alkaline Phosphatase: 101 U/L (ref 39–117)
BUN: 12 mg/dL (ref 6–23)
CALCIUM: 10.6 mg/dL — AB (ref 8.4–10.5)
CO2: 31 meq/L (ref 19–32)
Chloride: 99 mEq/L (ref 96–112)
Creatinine, Ser: 0.95 mg/dL (ref 0.40–1.50)
GFR: 80.88 mL/min (ref >60.00)
Glucose, Bld: 108 mg/dL — ABNORMAL HIGH (ref 70–99)
POTASSIUM: 5.4 meq/L — AB (ref 3.5–5.1)
SODIUM: 140 meq/L (ref 135–145)
Total Bilirubin: 0.8 mg/dL (ref 0.2–1.2)
Total Protein: 7.3 g/dL (ref 6.0–8.3)

## 2016-12-19 LAB — CBC
HEMATOCRIT: 51.6 % (ref 39.0–52.0)
HEMOGLOBIN: 17.1 g/dL — AB (ref 13.0–17.0)
MCHC: 33 g/dL (ref 30.0–36.0)
MCV: 92 fl (ref 78.0–100.0)
Platelets: 211 10*3/uL (ref 150.0–400.0)
RBC: 5.61 Mil/uL (ref 4.22–5.81)
RDW: 13.7 % (ref 11.5–15.5)
WBC: 8.3 10*3/uL (ref 4.0–10.5)

## 2016-12-19 LAB — MICROALBUMIN / CREATININE URINE RATIO
Creatinine,U: 227.5 mg/dL
MICROALB/CREAT RATIO: 8.5 mg/g (ref 0.0–30.0)
Microalb, Ur: 19.4 mg/dL — ABNORMAL HIGH (ref 0.0–1.9)

## 2016-12-19 LAB — URINALYSIS, ROUTINE W REFLEX MICROSCOPIC
BILIRUBIN URINE: NEGATIVE
Hgb urine dipstick: NEGATIVE
KETONES UR: NEGATIVE
LEUKOCYTES UA: NEGATIVE
NITRITE: NEGATIVE
Specific Gravity, Urine: 1.015 (ref 1.000–1.030)
Total Protein, Urine: 30 — AB
Urine Glucose: NEGATIVE
Urobilinogen, UA: 0.2 (ref 0.0–1.0)
pH: 8 (ref 5.0–8.0)

## 2016-12-19 LAB — HEMOGLOBIN A1C: Hgb A1c MFr Bld: 6.7 % — ABNORMAL HIGH (ref 4.6–6.5)

## 2016-12-19 LAB — PSA: PSA: 3.48 ng/mL (ref 0.10–4.00)

## 2016-12-19 MED ORDER — TAMSULOSIN HCL 0.4 MG PO CAPS: 0.4000 mg | ORAL_CAPSULE | Freq: Every day | ORAL | 0 refills | Status: DC

## 2016-12-19 NOTE — Assessment & Plan Note (Signed)
Currently stable with medication regimen. Obtain lipid profile. Continue current dosage of atorvastatin pending lipid profile results.

## 2016-12-19 NOTE — Assessment & Plan Note (Signed)
Appears stable and progressing well. Continue with risk factor management with control of cholesterol, blood sugars and blood pressure.

## 2016-12-19 NOTE — Assessment & Plan Note (Signed)
Reviewed and updated patient's medical, surgical, family and social history. Medications and allergies were also reviewed. Basic screenings for depression, activities of daily living, hearing, cognition and safety were performed. Provider list was updated and health plan was provided to the patient.  

## 2016-12-19 NOTE — Progress Notes (Signed)
Subjective:    Patient ID: Luis [name], male    DOB: November 26, 1933, 83 y.o.   MRN: [id number]  Chief Complaint  Patient presents with  . CPE    fasting    HPI:  Luis [name] is a 83 y.o. male who presents today for a Medicare Annual Wellness/Physical exam.    1) Health Maintenance -   Diet - Averages about 2-3 meals per day consisting of a regular diet; Caffeine intake 2-3 cups daily; Works to stay on a schedule.  Exercise - No structured exercise since working with physical therapy.   2) Preventative Exams / Immunizations:  Dental -- Up to date  Vision -- Up to date   Health Maintenance  Topic Date Due  . FOOT EXAM  01/08/1946  . URINE MICROALBUMIN  01/08/1946  . TETANUS/TDAP  01/09/1955  . PNA vac Low Risk Adult (2 of 2 - PPSV23) 07/09/2015  . OPHTHALMOLOGY EXAM  08/07/2016  . HEMOGLOBIN A1C  01/11/2017  . INFLUENZA VACCINE  Completed     Immunization History  Administered Date(s) Administered  . Influenza Split 08/06/2012, 07/08/2013, 06/21/2014  . Influenza,inj,Quad PF,36+ Mos 06/16/2015, 06/29/2016  . Pneumococcal Conjugate-13 07/08/2014  . Pneumococcal Polysaccharide-23 12/19/2016  . Zoster 08/06/2012    3.) Urinary frequency - Continues to experience the associated symptoms of urinary frequency with no hematuria, dysuria or urgency. Denies pelvic or flank pain. No fevers. Symptoms are generally worse at night but occur throughout the day. Modifying factors include decreasing caffeine and fluid intake in the afternoon/evening.    RISK FACTORS  Tobacco History  Smoking Status  . Former Smoker  . Packs/day: 2.00  . Years: 35.00  . Types: Cigarettes  . Quit date: 10/08/1985  Smokeless Tobacco  . Former Systems developer  . Types: Chew  . Quit date: 10/08/1997     Cardiac risk factors: advanced age (older than 67 for men, 60 for women), diabetes mellitus, dyslipidemia, hypertension and male gender.  Depression Screen  Depression screen [facility] 2/9 12/19/2016    Decreased Interest 0  Down, Depressed, Hopeless 0  PHQ - 2 Score 0     Activities of Daily Living In your present state of health, do you have any difficulty performing the following activities?:  Driving? Not currently driving.  Managing money?  Unable Feeding yourself? No Getting from bed to chair? No Climbing a flight of stairs? No Preparing food and eating?: No Bathing or showering? Needs assistance Getting dressed: No Getting to the toilet? No Using the toilet: No Moving around from place to place: No In the past year have you fallen or had a near fall?:No   Home Safety Has smoke detector and wears seat belts.. No excess sun exposure. Are there smokers in your home (other than you)?  No Do you feel safe at home?  Yes  Hearing Difficulties: No Do you often ask people to speak up or repeat themselves? No Do you experience ringing or noises in your ears? No  Do you have difficulty understanding soft or whispered voices? No    Cognitive Testing  Alert? Yes   Normal Appearance? Yes  Oriented to person? Yes  Place? Yes   Time? Yes  Recall of three objects?  Yes  Can perform simple calculations? Yes  Displays appropriate judgment? Yes  Can read the correct time from a watch face? Yes  Do you feel that you have a problem with memory? No  Do you often misplace items? No   Advanced  Directives have been discussed with the patient? Yes   Current Physicians/Providers and Suppliers  1. Terri Piedra, FNP - Internal Medicine 2. Baird Lyons, MD - Pulmonology 3. Ellouise Newer, MD - Neurology 4. Rexene Edison, NP - Pulmonology  Indicate any recent Medical Services you may have received from other than [facility] providers in the past year (date may be approximate).  All answers were reviewed with the patient and necessary referrals were made:  Luis [name], [place]   12/19/2016    Allergies  Allergen Reactions  . Tranexamic Acid Anaphylaxis    Had in 09/09/2016 in [facility] and  had anaphylaxis reaction     Outpatient Medications Prior to Visit  Medication Sig Dispense Refill  . albuterol (PROVENTIL HFA;VENTOLIN HFA) 108 (90 BASE) MCG/ACT inhaler Inhale 2 puffs into the lungs every 6 (six) hours as needed for wheezing or shortness of breath. 1 Inhaler 11  . aspirin EC 325 MG EC tablet Take 1 tablet (325 mg total) by mouth daily. 30 tablet 0  . atorvastatin (LIPITOR) 80 MG tablet Take 1 tablet (80 mg total) by mouth daily. 90 tablet 3  . Cholecalciferol (D3 MAXIMUM STRENGTH) 5000 UNITS capsule Take 5,000 Units by mouth daily.    . clopidogrel (PLAVIX) 75 MG tablet Take 1 tablet (75 mg total) by mouth daily. 30 tablet 2  . donepezil (ARICEPT) 10 MG tablet Take 1 tablet daily 90 tablet 3  . ipratropium-albuterol (DUONEB) 0.5-2.5 (3) MG/3ML SOLN INHALE CONTENTS OF 1 VIAL IN NEBULIZER EVERY 6 HOURS AS NEEDED. 90 mL 11  . metFORMIN (GLUCOPHAGE-XR) 500 MG 24 hr tablet Take 1 tablet by mouth daily.    . nitroGLYCERIN (NITROSTAT) 0.4 MG SL tablet Place 0.4 mg under the tongue every 5 (five) minutes as needed for chest pain. Reported on 03/19/2016    . OXYGEN Inhale into the lungs. On 2 liters during the day and at night    . polyethylene glycol powder (GLYCOLAX/MIRALAX) powder TAKE 17 GRAMS (1 CAPFUL) IN 8 OZ OF FLUID ONCE DAILY 527 g 2  . Respiratory Therapy Supplies (FLUTTER) DEVI Blow through as directed 1 each 0   No facility-administered medications prior to visit.      Past Medical History:  Diagnosis Date  . Arthritis   . COPD (chronic obstructive pulmonary disease) ([place])   . Diabetes mellitus without complication ([place])   . Dysphagia   . Emphysema   . Emphysema of lung ([place])   . Exertional shortness of breath   . GERD (gastroesophageal reflux disease)   . Heart murmur    as a child  . High cholesterol   . Hypertension      Past Surgical History:  Procedure Laterality Date  . CATARACT EXTRACTION W/ INTRAOCULAR LENS IMPLANT Left 2011  . COLONOSCOPY N/A  01/03/2016   Procedure: COLONOSCOPY;  Surgeon: Aviva Signs, MD;  [facility];  Service: Gastroenterology;  Laterality: N/A;  . ESOPHAGEAL DILATION  2000's   x2  . EYE SURGERY    . INGUINAL HERNIA REPAIR Right 1970's  . INGUINAL HERNIA REPAIR Bilateral 03/12/2013   w/mesh bilaterally/notes 03/12/2013  . INGUINAL HERNIA REPAIR Bilateral 03/12/2013   Procedure: HERNIA REPAIR INGUINAL ADULT BILATERAL;  Surgeon: Merrie Roof, MD;  Location: [place];  Service: General;  Laterality: Bilateral;  . INSERTION OF MESH Bilateral 03/12/2013   Procedure: INSERTION OF MESH;  Surgeon: Merrie Roof, MD;  Location: [place];  Service: General;  Laterality: Bilateral;  . RETINAL  DETACHMENT SURGERY Right 08/29/2010  . TONSILLECTOMY  1940's  . TRANSURETHRAL RESECTION OF PROSTATE  2002; 2013     Family History  Problem Relation Age of Onset  . Stroke Mother   . Healthy Father      Social History   Social History  . Marital status: Married    Spouse name: N/A  . Number of children: 3  . Years of education: 12   Occupational History  . Retired    Social History Main Topics  . Smoking status: Former Smoker    Packs/day: 2.00    Years: 35.00    Types: Cigarettes    Quit date: 10/08/1985  . Smokeless tobacco: Former Systems developer    Types: Chew    Quit date: 10/08/1997  . Alcohol use No  . Drug use: No  . Sexual activity: Not Currently   Other Topics Concern  . Not on file   Social History Narrative   Fun: Golf   Denies abuse and feels safe at home.      Review of Systems  Constitutional: Denies fever, chills, fatigue, or significant weight gain/loss. HENT: Head: Denies headache or neck pain Ears: Denies changes in hearing, ringing in ears, earache, drainage Nose: Denies discharge, stuffiness, itching, nosebleed, sinus pain Throat: Denies sore throat, hoarseness, dry mouth, sores, thrush Eyes: Denies loss/changes in vision, pain, redness, blurry/double vision, flashing  lights Cardiovascular: Denies chest pain/discomfort, tightness, palpitations, shortness of breath with activity, difficulty lying down, swelling, sudden awakening with shortness of breath Respiratory: Denies shortness of breath, cough, sputum production, wheezing Gastrointestinal: Denies dysphasia, heartburn, change in appetite, nausea, change in bowel habits, rectal bleeding, constipation, diarrhea, yellow skin or eyes Genitourinary: Denies frequency, urgency, burning/pain, blood in urine, incontinence, change in urinary strength. Musculoskeletal: Denies muscle/joint pain, stiffness, back pain, redness or swelling of joints, trauma Skin: Denies rashes, lumps, itching, dryness, color changes, or hair/nail changes Neurological: Denies dizziness, fainting, seizures, weakness, numbness, tingling, tremor Psychiatric - Denies nervousness, stress, depression or memory loss Endocrine: Denies heat or cold intolerance, sweating, frequent urination, excessive thirst, changes in appetite Hematologic: Denies ease of bruising or bleeding    Objective:     BP 132/82 (BP Location: Left Arm, Patient Position: Sitting, Cuff Size: Normal)   Pulse (!) 54   Temp 97.8 F (36.6 C) (Oral)   Resp 16   Ht 5\' 11"  (1.803 m)   Wt 165 lb (74.8 kg)   SpO2 91%   BMI 23.01 kg/m  Nursing note and vital signs reviewed.  Physical Exam  Constitutional: He is oriented to person, place, and time. He appears well-developed and well-nourished.  HENT:  Head: Normocephalic.  Right Ear: Hearing, tympanic membrane, external ear and ear canal normal.  Left Ear: Hearing, tympanic membrane, external ear and ear canal normal.  Nose: Nose normal.  Mouth/Throat: Uvula is midline, oropharynx is clear and moist and mucous membranes are normal.  Eyes: Conjunctivae and EOM are normal. Pupils are equal, round, and reactive to light.  Neck: Neck supple. No JVD present. No tracheal deviation present. No thyromegaly present.   Cardiovascular: Normal rate, regular rhythm, normal heart sounds and intact distal pulses.   Pulmonary/Chest: Effort normal and breath sounds normal.  Abdominal: Soft. Bowel sounds are normal. He exhibits no distension and no mass. There is no tenderness. There is no rebound and no guarding.  Musculoskeletal: Normal range of motion. He exhibits no edema or tenderness.  Lymphadenopathy:    He has no cervical adenopathy.  Neurological: He  is alert and oriented to person, place, and time. He has normal reflexes. No cranial nerve deficit. He exhibits normal muscle tone. Coordination normal.  Skin: Skin is warm and dry.  Psychiatric: He has a normal mood and affect. His behavior is normal. Judgment and thought content normal.       Assessment & Plan:   During the course of the visit the patient was educated and counseled about appropriate screening and preventive services including:    Pneumococcal vaccine   Influenza vaccine  Prostate cancer screening  Diabetes screening  Nutrition counseling   Diet review for nutrition referral? Yes ____  Not Indicated _X___   Patient Instructions (the written plan) was given to the patient.  Medicare Attestation I have personally reviewed: The patient's medical and social history Their use of alcohol, tobacco or illicit drugs Their current medications and supplements The patient's functional ability including ADLs,fall risks, home safety risks, cognitive, and hearing and visual impairment Diet and physical activities Evidence for depression or mood disorders  The patient's weight, height, BMI,  have been recorded in the chart.  I have made referrals, counseling, and provided education to the patient based on review of the above and I have provided the patient with a written personalized care plan for preventive services.     Problem List Items Addressed This Visit      Cardiovascular and Mediastinum   Essential hypertension    Blood  pressure stable and below goal of 140/90 with current lifestyle management. Denies worse headache of life with no symptoms of end organ damage noted on physical exam. Monitor blood pressure at home and follow low sodium diet. Continue to monitor.       Relevant Orders   Comprehensive metabolic panel (Completed)   CVA (cerebral vascular accident) ([place])    Appears stable and progressing well. Continue with risk factor management with control of cholesterol, blood sugars and blood pressure.       Relevant Orders   Lipid panel (Completed)     Endocrine   Diabetes mellitus ([place])     Type 2 diabetes appears adequately controlled with current medication regimenand no adverse side effects or hypoglycemia. Diabetic foot exam completed. Pneuomax updated today. Continue to monitor blood sugars at home and monitor carbohydrate intake. Continue metformin pending A1c results.      Relevant Orders   Hemoglobin A1c (Completed)   Urine Microalbumin w/creat. ratio (Completed)     Nervous and Auditory   Vascular dementia without behavioral disturbance    Continues to have decreased memory function. Maintained on Donepazil and managed by Neurology. Continue to monitor.         Other   Hyperlipidemia    Currently stable with medication regimen. Obtain lipid profile. Continue current dosage of atorvastatin pending lipid profile results.       Urinary frequency    Urinary frequency with concern for BPH versus overactive bladder. Obtain UA. Start Flomax. Follow up pending trial of medication and UA results.       Medicare annual wellness visit, subsequent - Primary    Reviewed and updated patient's medical, surgical, family and social history. Medications and allergies were also reviewed. Basic screenings for depression, activities of daily living, hearing, cognition and safety were performed. Provider list was updated and health plan was provided to the patient.       Encounter for general adult  medical examination with abnormal findings    1) Anticipatory Guidance: Discussed importance of wearing a seatbelt  while driving and not texting while driving; changing batteries in smoke detector at least once annually; wearing suntan lotion when outside; eating a balanced and moderate diet; getting physical activity at least 30 minutes per day.  2) Immunizations / Screenings / Labs:  Pneumonia updated today. Declines tetanus. All other immunizations are up-to-date per recommendations. Obtain PSA for prostate cancer screening. Obtain A1c and urine microalbumin for diabetes screens. Diabetic foot exam completed today. Depression screen negative. All other screenings are up to date per recommendations. Obtain CBC, CMET, UA, and lipid profile.    Overall well exam with risk factors for cardiovascular disease including previous stroke, hypertension, hyperlipidemia, and type 2 diabetes. Chronic conditions appear adequately controlled through medication and lifestyle regimens with no adverse side effects. Does continue to have decreases and lapses in memory. Recommend increasing physical activity to 30 minutes of moderate level activity daily. He is of good weight and eats regularly. May consider additional nutritional supplement if weight continues to decrease. Continue other healthy lifestyle behaviors and choices. Follow-up prevention exam in 1 year. Follow-up office visit for chronic conditions pending blood work.       Relevant Orders   Comprehensive metabolic panel (Completed)   Lipid panel (Completed)   PSA (Completed)   CBC (Completed)   Hemoglobin A1c (Completed)   Urine Microalbumin w/creat. ratio (Completed)   Urinalysis    Other Visit Diagnoses    Need for 23-polyvalent pneumococcal polysaccharide vaccine       Relevant Orders   Pneumococcal polysaccharide vaccine 23-valent greater than or equal to 2yo subcutaneous/IM (Completed)       I am having Mr. Abreu start on tamsulosin. I  am also having him maintain his nitroGLYCERIN, Cholecalciferol, albuterol, OXYGEN, ipratropium-albuterol, metFORMIN, polyethylene glycol powder, aspirin, donepezil, clopidogrel, atorvastatin, and FLUTTER.   Meds ordered this encounter  Medications  . tamsulosin (FLOMAX) 0.4 MG CAPS capsule    Sig: Take 1 capsule (0.4 mg total) by mouth daily.    Dispense:  30 capsule    Refill:  0    Order Specific Question:   Supervising Provider    Answer:   Pricilla Holm A [[id number]]     Follow-up: Return in about 3 months (around 03/21/2017), or if symptoms worsen or fail to improve.   Luis Po, FNP

## 2016-12-19 NOTE — Assessment & Plan Note (Signed)
Urinary frequency with concern for BPH versus overactive bladder. Obtain UA. Start Flomax. Follow up pending trial of medication and UA results.

## 2016-12-19 NOTE — Assessment & Plan Note (Addendum)
Type 2 diabetes appears adequately controlled with current medication regimenand no adverse side effects or hypoglycemia. Diabetic foot exam completed. Pneuomax updated today. Continue to monitor blood sugars at home and monitor carbohydrate intake. Continue metformin pending A1c results.

## 2016-12-19 NOTE — Assessment & Plan Note (Signed)
Continues to have decreased memory function. Maintained on Donepazil and managed by Neurology. Continue to monitor.

## 2016-12-19 NOTE — Assessment & Plan Note (Addendum)
1) Anticipatory Guidance: Discussed importance of wearing a seatbelt while driving and not texting while driving; changing batteries in smoke detector at least once annually; wearing suntan lotion when outside; eating a balanced and moderate diet; getting physical activity at least 30 minutes per day.  2) Immunizations / Screenings / Labs:  Pneumonia updated today. Declines tetanus. All other immunizations are up-to-date per recommendations. Obtain PSA for prostate cancer screening. Obtain A1c and urine microalbumin for diabetes screens. Diabetic foot exam completed today. Depression screen negative. All other screenings are up to date per recommendations. Obtain CBC, CMET, UA, and lipid profile.    Overall well exam with risk factors for cardiovascular disease including previous stroke, hypertension, hyperlipidemia, and type 2 diabetes. Chronic conditions appear adequately controlled through medication and lifestyle regimens with no adverse side effects. Does continue to have decreases and lapses in memory. Recommend increasing physical activity to 30 minutes of moderate level activity daily. He is of good weight and eats regularly. May consider additional nutritional supplement if weight continues to decrease. Continue other healthy lifestyle behaviors and choices. Follow-up prevention exam in 1 year. Follow-up office visit for chronic conditions pending blood work.

## 2016-12-19 NOTE — Assessment & Plan Note (Signed)
Blood pressure stable and below goal of 140/90 with current lifestyle management. Denies worse headache of life with no symptoms of end organ damage noted on physical exam. Monitor blood pressure at home and follow low sodium diet. Continue to monitor.

## 2016-12-19 NOTE — Patient Instructions (Addendum)
Thank you for choosing [facility].  SUMMARY AND INSTRUCTIONS:  Recommend staying with a schedule throughout the day.  Eat frequent, small meals.   May include Boost / Ensure if needed for extra calories.  Increase physical activity to goal of 2-3 times per week. Aim for walking goal of 8,000-10,000 steps daily.  Medication:  Please start taking the Flomax daily.  Continue to take other medications as prescribed.   Your prescription(s) have been submitted to your pharmacy or been printed and provided for you. Please take as directed and contact our office if you believe you are having problem(s) with the medication(s) or have any questions.  Labs:  Please stop by the lab on the lower level of the building for your blood work. Your results will be released to [place] (or called to you) after review, usually within 72 hours after test completion. If any changes need to be made, you will be notified at that same time.  1.) The lab is open from 7:30am to 5:30 pm Monday-Friday 2.) No appointment is necessary 3.) Fasting (if needed) is 6-8 hours after food and drink; black coffee and water are okay   Follow up:  If your symptoms worsen or fail to improve, please contact our office for further instruction, or in case of emergency go directly to the emergency room at the closest medical facility.   Health Maintenance  Topic Date Due  . FOOT EXAM  01/08/1944  . URINE MICROALBUMIN  01/08/1944  . TETANUS/TDAP  01/09/1955  . PNA vac Low Risk Adult (2 of 2 - PPSV23) 07/09/2015  . OPHTHALMOLOGY EXAM  08/07/2016  . HEMOGLOBIN A1C  01/11/2017  . INFLUENZA VACCINE  Completed     Health Maintenance, Male A healthy lifestyle and preventive care is important for your health and wellness. Ask your health care provider about what schedule of regular examinations is right for you. What should I know about weight and diet?  Eat a Healthy Diet  Eat plenty of vegetables, fruits, whole  grains, low-fat dairy products, and lean protein.  Do not eat a lot of foods high in solid fats, added sugars, or salt. Maintain a Healthy Weight  Regular exercise can help you achieve or maintain a healthy weight. You should:  Do at least 150 minutes of exercise each week. The exercise should increase your heart rate and make you sweat (moderate-intensity exercise).  Do strength-training exercises at least twice a week. Watch Your Levels of Cholesterol and Blood Lipids  Have your blood tested for lipids and cholesterol every 5 years starting at 81 years of age. If you are at high risk for heart disease, you should start having your blood tested when you are 81 years old. You may need to have your cholesterol levels checked more often if:  Your lipid or cholesterol levels are high.  You are older than 81 years of age.  You are at high risk for heart disease. What should I know about cancer screening? Many types of cancers can be detected early and may often be prevented. Lung Cancer  You should be screened every year for lung cancer if:  You are a current smoker who has smoked for at least 30 years.  You are a former smoker who has quit within the past 15 years.  Talk to your health care provider about your screening options, when you should start screening, and how often you should be screened. Colorectal Cancer  Routine colorectal cancer screening usually begins at  81 years of age and should be repeated every 5-10 years until you are 80 years old. You may need to be screened more often if early forms of precancerous polyps or small growths are found. Your health care provider may recommend screening at an earlier age if you have risk factors for colon cancer.  Your health care provider may recommend using home test kits to check for hidden blood in the stool.  A small camera at the end of a tube can be used to examine your colon (sigmoidoscopy or colonoscopy). This checks for the  earliest forms of colorectal cancer. Prostate and Testicular Cancer  Depending on your age and overall health, your health care provider may do certain tests to screen for prostate and testicular cancer.  Talk to your health care provider about any symptoms or concerns you have about testicular or prostate cancer. Skin Cancer  Check your skin from head to toe regularly.  Tell your health care provider about any new moles or changes in moles, especially if:  There is a change in a mole's size, shape, or color.  You have a mole that is larger than a pencil eraser.  Always use sunscreen. Apply sunscreen liberally and repeat throughout the day.  Protect yourself by wearing long sleeves, pants, a wide-brimmed hat, and sunglasses when outside. What should I know about heart disease, diabetes, and high blood pressure?  If you are 57-39 years of age, have your blood pressure checked every 3-5 years. If you are 51 years of age or older, have your blood pressure checked every year. You should have your blood pressure measured twice-once when you are at a hospital or clinic, and once when you are not at a hospital or clinic. Record the average of the two measurements. To check your blood pressure when you are not at a hospital or clinic, you can use:  An automated blood pressure machine at a pharmacy.  A home blood pressure monitor.  Talk to your health care provider about your target blood pressure.  If you are between 48-52 years old, ask your health care provider if you should take aspirin to prevent heart disease.  Have regular diabetes screenings by checking your fasting blood sugar level.  If you are at a normal weight and have a low risk for diabetes, have this test once every three years after the age of 10.  If you are overweight and have a high risk for diabetes, consider being tested at a younger age or more often.  A one-time screening for abdominal aortic aneurysm (AAA) by  ultrasound is recommended for men aged 52-75 years who are current or former smokers. What should I know about preventing infection? Hepatitis B  If you have a higher risk for hepatitis B, you should be screened for this virus. Talk with your health care provider to find out if you are at risk for hepatitis B infection. Hepatitis C  Blood testing is recommended for:  Everyone born from [birth year] through 1963.  Anyone with known risk factors for hepatitis C. Sexually Transmitted Diseases (STDs)  You should be screened each year for STDs including gonorrhea and chlamydia if:  You are sexually active and are younger than 81 years of age.  You are older than 81 years of age and your health care provider tells you that you are at risk for this type of infection.  Your sexual activity has changed since you were last screened and you are at an increased  risk for chlamydia or gonorrhea. Ask your health care provider if you are at risk.  Talk with your health care provider about whether you are at high risk of being infected with HIV. Your health care provider may recommend a prescription medicine to help prevent HIV infection. What else can I do?  Schedule regular health, dental, and eye exams.  Stay current with your vaccines (immunizations).  Do not use any tobacco products, such as cigarettes, chewing tobacco, and e-cigarettes. If you need help quitting, ask your health care provider.  Limit alcohol intake to no more than 2 drinks per day. One drink equals 12 ounces of beer, 5 ounces of wine, or 1 ounces of hard liquor.  Do not use street drugs.  Do not share needles.  Ask your health care provider for help if you need support or information about quitting drugs.  Tell your health care provider if you often feel depressed.  Tell your health care provider if you have ever been abused or do not feel safe at home. This information is not intended to replace advice given to you by your  health care provider. Make sure you discuss any questions you have with your health care provider. Document Released: 03/22/2008 Document Revised: 05/23/2016 Document Reviewed: 06/28/2015 Elsevier Interactive Patient Education  2017 [employer].

## 2016-12-20 ENCOUNTER — Ambulatory Visit (INDEPENDENT_AMBULATORY_CARE_PROVIDER_SITE_OTHER): Payer: Medicare Other | Admitting: Otolaryngology

## 2017-01-16 ENCOUNTER — Other Ambulatory Visit: Payer: Self-pay | Admitting: Family

## 2017-01-22 ENCOUNTER — Encounter: Payer: Self-pay | Admitting: Family

## 2017-01-22 ENCOUNTER — Ambulatory Visit (INDEPENDENT_AMBULATORY_CARE_PROVIDER_SITE_OTHER): Payer: Medicare Other | Admitting: Family

## 2017-01-22 VITALS — BP 130/78 | HR 54 | Temp 97.7°F | Resp 22 | Ht 71.0 in | Wt 169.0 lb

## 2017-01-22 DIAGNOSIS — R350 Frequency of micturition: Secondary | ICD-10-CM | POA: Diagnosis not present

## 2017-01-22 DIAGNOSIS — J449 Chronic obstructive pulmonary disease, unspecified: Secondary | ICD-10-CM | POA: Diagnosis not present

## 2017-01-22 NOTE — Assessment & Plan Note (Signed)
Symptoms appear consistent with COPD without significant exacerbation. Encouraged continued use of flutter valve. Start guaifenesin. Encourage deep breathing and coughing periodically. Continue current dosage of albuterol as needed for wheezing. Follow-up and changes per pulmonology. Continue to monitor.

## 2017-01-22 NOTE — Assessment & Plan Note (Signed)
Symptoms of urinary frequency appear improved with start of Flomax and reduced nighttime urination. No adverse side effects. Continue current dosage of Flomax. Continue to monitor.

## 2017-01-22 NOTE — Progress Notes (Signed)
Subjective:    Patient ID: Luis [name], male    DOB: 03/22/1934, 83 y.o.   MRN: [id number]  Chief Complaint  Patient presents with  . Follow-up    has constant raddling and wheezing in his chest, cough    HPI:  Luis [name] is a 83 y.o. male who  has a past medical history of Arthritis; COPD (chronic obstructive pulmonary disease) ([place]); Diabetes mellitus without complication ([place]); Dysphagia; Emphysema; Emphysema of lung ([place]); Exertional shortness of breath; GERD (gastroesophageal reflux disease); Heart murmur; High cholesterol; and Hypertension. and presents today for an office visit.  1.)  Urinary frequency - Recently started on Flomax for urinary frequency with concern for BPH. Reports taking the medication as prescribed and denies adverse side effects. Symptoms are improved with the medication with reduced urinary frequency at times.   2.) Coughing - This is a new problem. Associated symptom of wheezing and cough which has not improved since his previous office visit. Denies fevers. Is using Flutter valve. Not currently using the [facility] as it did not appear to make a significant difference. Also working on coughing without deep breathing. Cough is occasionally productive. t  Allergies  Allergen Reactions  . Tranexamic Acid Anaphylaxis    Had in 09/09/2016 in [facility] ED and had anaphylaxis reaction      Outpatient Medications Prior to Visit  Medication Sig Dispense Refill  . albuterol (PROVENTIL HFA;VENTOLIN HFA) 108 (90 BASE) MCG/ACT inhaler Inhale 2 puffs into the lungs every 6 (six) hours as needed for wheezing or shortness of breath. 1 Inhaler 11  . aspirin EC 325 MG EC tablet Take 1 tablet (325 mg total) by mouth daily. 30 tablet 0  . atorvastatin (LIPITOR) 80 MG tablet Take 1 tablet (80 mg total) by mouth daily. 90 tablet 3  . Cholecalciferol (D3 MAXIMUM STRENGTH) 5000 UNITS capsule Take 5,000 Units by mouth daily.    . clopidogrel (PLAVIX) 75 MG tablet Take 1 tablet (75  mg total) by mouth daily. 30 tablet 2  . donepezil (ARICEPT) 10 MG tablet Take 1 tablet daily 90 tablet 3  . ipratropium-albuterol (DUONEB) 0.5-2.5 (3) MG/3ML SOLN INHALE CONTENTS OF 1 VIAL IN NEBULIZER EVERY 6 HOURS AS NEEDED. 90 mL 11  . metFORMIN (GLUCOPHAGE-XR) 500 MG 24 hr tablet Take 1 tablet by mouth daily.    . nitroGLYCERIN (NITROSTAT) 0.4 MG SL tablet Place 0.4 mg under the tongue every 5 (five) minutes as needed for chest pain. Reported on 03/19/2016    . OXYGEN Inhale into the lungs. On 2 liters during the day and at night    . polyethylene glycol powder (GLYCOLAX/MIRALAX) powder TAKE 17 GRAMS (1 CAPFUL) IN 8 OZ OF FLUID ONCE DAILY 527 g 2  . Respiratory Therapy Supplies (FLUTTER) DEVI Blow through as directed 1 each 0  . tamsulosin (FLOMAX) 0.4 MG CAPS capsule TAKE ONE CAPSULE BY MOUTH DAILY. 90 capsule 1   No facility-administered medications prior to visit.      Review of Systems  Constitutional: Negative for chills and fever.  HENT: Positive for congestion.   Respiratory: Positive for cough and wheezing. Negative for chest tightness and shortness of breath.   Genitourinary: Positive for frequency. Negative for difficulty urinating, dysuria, flank pain, hematuria and urgency.  Neurological: Negative for headaches.      Objective:    BP 130/78 (BP Location: Left Arm, Patient Position: Sitting, Cuff Size: Normal)   Pulse (!) 54   Temp 97.7 F (36.5 C) (Oral)  Resp (!) 22   Ht 5\' 11"  (1.803 m)   Wt 169 lb (76.7 kg)   SpO2 96%   BMI 23.57 kg/m  Nursing note and vital signs reviewed.  Physical Exam  Constitutional: He is oriented to person, place, and time. He appears well-developed and well-nourished. No distress.  Cardiovascular: Normal rate, regular rhythm, normal heart sounds and intact distal pulses.   Pulmonary/Chest: Effort normal. No respiratory distress. He has no wheezes. He has rales. He exhibits no tenderness.  Neurological: He is alert and oriented to  person, place, and time.  Skin: Skin is warm and dry.  Psychiatric: He has a normal mood and affect. His behavior is normal. Judgment and thought content normal.       Assessment & Plan:   Problem List Items Addressed This Visit      Respiratory   COPD mixed type ([place]) - Primary    Symptoms appear consistent with COPD without significant exacerbation. Encouraged continued use of flutter valve. Start guaifenesin. Encourage deep breathing and coughing periodically. Continue current dosage of albuterol as needed for wheezing. Follow-up and changes per pulmonology. Continue to monitor.        Other   Urinary frequency    Symptoms of urinary frequency appear improved with start of Flomax and reduced nighttime urination. No adverse side effects. Continue current dosage of Flomax. Continue to monitor.          I am having [name] maintain his nitroGLYCERIN, Cholecalciferol, albuterol, OXYGEN, ipratropium-albuterol, metFORMIN, polyethylene glycol powder, aspirin, donepezil, clopidogrel, atorvastatin, FLUTTER, and tamsulosin.   Follow-up: Return in about 4 months (around 05/24/2017), or if symptoms worsen or fail to improve.  Mauricio Po, FNP

## 2017-01-22 NOTE — Patient Instructions (Addendum)
Thank you for choosing [facility].  SUMMARY AND INSTRUCTIONS:  Continue with the Flomax to help with your urinary freqeuncy.  Start guaifensin - [phone] mg twice daily in extended release.  Continue with flutter valve, deep breathing, and coughing.   Follow up:  If your symptoms worsen or fail to improve, please contact our office for further instruction, or in case of emergency go directly to the emergency room at the closest medical facility.

## 2017-02-04 ENCOUNTER — Telehealth: Payer: Self-pay | Admitting: Neurology

## 2017-02-04 NOTE — Telephone Encounter (Signed)
Patient step son Luis [name] wants to have someone call his mother [name]. Patient is having some really bad changes in the behavior and they feel like has had another episode

## 2017-02-04 NOTE — Telephone Encounter (Signed)
Spoke with pt's wife, Remo Lipps.  She is wondering if pt should have another "check up" with Dr. Delice Lesch.  States the pt is getting confused more often now.  The other night he woke up in the middle of the night and moved thing around the house while clinging onto the remote to the couples security system. He seems to think that [place] are [place], which they sold last year.    I let pt wife know that I will contact her after I speak with Dr. Delice Lesch.

## 2017-02-05 MED ORDER — DIVALPROEX SODIUM ER 250 MG PO TB24: ORAL_TABLET | ORAL | 6 refills | Status: DC

## 2017-02-05 NOTE — Telephone Encounter (Signed)
Spoke with wife, he has been very confused for the past 2-3 weeks, sometimes acting like when he had the stroke previously. He would think they are some place else. Said he had a good time riding the golf cart, which he did not do recently. Or says they had a really good time today when they did not do anything. Worse at night, forgets way to bedroom or bathroom. Showers are a big problem, he will fight. The other night had security remote control in his hand. Another night he got out of bed and was redirected back to bed. Discussed starting Depakote for behavior, side effects were discussed, start low dose Depakote ER 250mg  qhs. Rx sent to pharm, wife knows to call for any changes.

## 2017-02-08 ENCOUNTER — Other Ambulatory Visit: Payer: Self-pay | Admitting: Family

## 2017-02-08 DIAGNOSIS — I639 Cerebral infarction, unspecified: Secondary | ICD-10-CM

## 2017-03-21 ENCOUNTER — Other Ambulatory Visit: Payer: Self-pay | Admitting: Family

## 2017-03-26 ENCOUNTER — Ambulatory Visit (INDEPENDENT_AMBULATORY_CARE_PROVIDER_SITE_OTHER): Payer: Medicare Other | Admitting: Nurse Practitioner

## 2017-03-26 ENCOUNTER — Encounter: Payer: Self-pay | Admitting: Nurse Practitioner

## 2017-03-26 ENCOUNTER — Ambulatory Visit (INDEPENDENT_AMBULATORY_CARE_PROVIDER_SITE_OTHER)
Admission: RE | Admit: 2017-03-26 | Discharge: 2017-03-26 | Disposition: A | Discharge status: Home / Self Care | Payer: Medicare Other | Source: Ambulatory Visit | Attending: Nurse Practitioner | Admitting: Nurse Practitioner

## 2017-03-26 VITALS — BP 142/80 | HR 70 | Temp 97.7°F | Ht 71.0 in | Wt 167.0 lb

## 2017-03-26 DIAGNOSIS — R0602 Shortness of breath: Secondary | ICD-10-CM

## 2017-03-26 DIAGNOSIS — R609 Edema, unspecified: Secondary | ICD-10-CM

## 2017-03-26 DIAGNOSIS — R0989 Other specified symptoms and signs involving the circulatory and respiratory systems: Secondary | ICD-10-CM | POA: Diagnosis not present

## 2017-03-26 MED ORDER — FUROSEMIDE 20 MG PO TABS: 20.0000 mg | ORAL_TABLET | Freq: Every day | ORAL | 0 refills | Status: DC

## 2017-03-26 MED ORDER — POTASSIUM CHLORIDE ER 10 MEQ PO TBCR: 10.0000 meq | EXTENDED_RELEASE_TABLET | Freq: Every day | ORAL | 0 refills | Status: DC

## 2017-03-26 NOTE — Patient Instructions (Signed)
Go to basement for CXR.  Start lasix and potassium supplement as prescribed.  Wear knee high compression stocking during the day and off at night.  Maintain low sodium diet.

## 2017-03-26 NOTE — Progress Notes (Signed)
Subjective:  Patient ID: Luis [name], male    DOB: 1933/11/04  Age: 83 y.o. MRN: [id number]  CC: Edema (feet and ankles swelling for 3 days. hard to breath. )   Shortness of Breath  This is a recurrent problem. The problem occurs constantly. The problem has been gradually worsening. Associated symptoms include leg swelling, sputum production and wheezing. Pertinent negatives include no abdominal pain, chest pain, claudication, leg pain, orthopnea, PND or syncope. The symptoms are aggravated by any activity. He has tried beta agonist inhalers and OTC cough suppressants for the symptoms. His past medical history is significant for CAD, chronic lung disease and COPD. There is no history of a heart failure.   Outpatient Medications Prior to Visit  Medication Sig Dispense Refill  . albuterol (PROVENTIL HFA;VENTOLIN HFA) 108 (90 BASE) MCG/ACT inhaler Inhale 2 puffs into the lungs every 6 (six) hours as needed for wheezing or shortness of breath. 1 Inhaler 11  . aspirin EC 325 MG EC tablet Take 1 tablet (325 mg total) by mouth daily. 30 tablet 0  . atorvastatin (LIPITOR) 80 MG tablet Take 1 tablet (80 mg total) by mouth daily. 90 tablet 3  . Cholecalciferol (D3 MAXIMUM STRENGTH) 5000 UNITS capsule Take 5,000 Units by mouth daily.    . clopidogrel (PLAVIX) 75 MG tablet TAKE ONE TABLET BY MOUTH DAILY. 30 tablet 5  . donepezil (ARICEPT) 10 MG tablet Take 1 tablet daily 90 tablet 3  . ipratropium-albuterol (DUONEB) 0.5-2.5 (3) MG/3ML SOLN INHALE CONTENTS OF 1 VIAL IN NEBULIZER EVERY 6 HOURS AS NEEDED. 90 mL 11  . nitroGLYCERIN (NITROSTAT) 0.4 MG SL tablet Place 0.4 mg under the tongue every 5 (five) minutes as needed for chest pain. Reported on 03/19/2016    . OXYGEN Inhale into the lungs. On 2 liters during the day and at night    . polyethylene glycol powder (GLYCOLAX/MIRALAX) powder TAKE 17 GRAMS (1 CAPFUL) IN 8 OZ OF FLUID ONCE DAILY 527 g 2  . Respiratory Therapy Supplies (FLUTTER) DEVI Blow  through as directed 1 each 0  . tamsulosin (FLOMAX) 0.4 MG CAPS capsule TAKE ONE CAPSULE BY MOUTH DAILY. 90 capsule 1  . divalproex (DEPAKOTE ER) 250 MG 24 hr tablet Take 1 tablet at night (Patient not taking: Reported on 03/26/2017) 30 tablet 6  . metFORMIN (GLUCOPHAGE-XR) 500 MG 24 hr tablet Take 1 tablet by mouth daily.     No facility-administered medications prior to visit.     ROS See HPI  Objective:  BP (!) 142/80   Pulse 70   Temp 97.7 F (36.5 C)   Ht 5\' 11"  (1.803 m)   Wt 167 lb (75.8 kg)   SpO2 95%   BMI 23.29 kg/m   BP Readings from Last 3 Encounters:  03/26/17 (!) 142/80  01/22/17 130/78  12/19/16 132/82    Wt Readings from Last 3 Encounters:  03/26/17 167 lb (75.8 kg)  01/22/17 169 lb (76.7 kg)  12/19/16 165 lb (74.8 kg)    Physical Exam  Constitutional: He is oriented to person, place, and time.  Neck: JVD present.  Cardiovascular: Normal rate, regular rhythm and normal heart sounds.   Pulmonary/Chest: Effort normal. He has no wheezes. He has rales. He exhibits no tenderness.  Musculoskeletal: He exhibits edema and tenderness.  Neurological: He is alert and oriented to person, place, and time.  Skin: Skin is warm. No rash noted. No erythema.  Vitals reviewed.   Lab Results  Component Value Date  WBC 8.3 12/19/2016   HGB 17.1 (H) 12/19/2016   HCT 51.6 12/19/2016   PLT 211.0 12/19/2016   GLUCOSE 108 (H) 12/19/2016   CHOL 129 12/19/2016   TRIG 69.0 12/19/2016   HDL 50.50 12/19/2016   LDLCALC 64 12/19/2016   ALT 17 12/19/2016   AST 20 12/19/2016   NA 140 12/19/2016   K 5.4 (H) 12/19/2016   CL 99 12/19/2016   CREATININE 0.95 12/19/2016   BUN 12 12/19/2016   CO2 31 12/19/2016   TSH 1.809 11/12/2016   PSA 3.48 12/19/2016   INR 1.04 11/11/2016   HGBA1C 6.7 (H) 12/19/2016   MICROALBUR 19.4 (H) 12/19/2016    Ct Angio Head W Or Wo Contrast  Result Date: 11/13/2016 CLINICAL DATA:  One week history of confusion. EXAM: CT ANGIOGRAPHY HEAD AND  NECK TECHNIQUE: Multidetector CT imaging of the head and neck was performed using the standard protocol during bolus administration of intravenous contrast. Multiplanar CT image reconstructions and MIPs were obtained to evaluate the vascular anatomy. Carotid stenosis measurements (when applicable) are obtained utilizing NASCET criteria, using the distal internal carotid diameter as the denominator. CONTRAST:  75 cc Isovue 370 intravenous COMPARISON:  Brain MRI from yesterday FINDINGS: CT HEAD FINDINGS Brain: No visible acute infarction (finding on previous brain MRI is below CT threshold), hemorrhage, hydrocephalus, extra-axial collection or mass lesion/mass effect. Extensive white matter low-density in the bilateral cerebral hemispheres consistent with chronic microvascular disease. Vascular: See below Skull: No acute or aggressive finding Sinuses: No acute finding Orbits: Right cataract resection and scleral band. Review of the MIP images confirms the above findings CTA NECK FINDINGS Aortic arch: The arch is incompletely covered. The brachiocephalic left common carotid origins are not seen. Right carotid system: Mild scattered atherosclerotic plaque, greatest at the ICA bulb. The ICA is tortuous. No flow limiting stenosis, ulceration, or dissection. Left carotid system: Origin not visualized. Moderate calcified plaque at the common carotid bifurcation and ICA bulb without flow limiting stenosis, ulceration, or dissection. Vertebral arteries: No visualized proximal subclavian flow limiting stenosis. The right vertebral artery is dominant. There is prominent narrowing of the left vertebral artery origin. At the level of the dura, there is visible less intense opacification of the vertebral arteries lumen. Best visualized on reformats is intermittent irregularity of the lumen, especially prominent at the level of C3. This finding is asymmetric. Abnormal flow has been seen in this vessel since at least 2016 brain MRI  and there is no acute infarcts on recent brain MRI. Skeleton: No acute or aggressive finding Other neck: 33 mm left thyroid mass, size similar to chest CT from 2013. Upper chest: Saber trachea from COPD. Subpleural opacity at the right apex is likely scarring. Review of the MIP images confirms the above findings CTA HEAD FINDINGS Anterior circulation: Atherosclerotic calcification on the carotid siphons. No major branch occlusion or flow limiting stenosis. Negative for aneurysm. Notably negative ACA circulation in light of previous infarct. Posterior circulation: Less intense opacification of the left vertebral artery as described above. Mild atheromatous type narrowing of the left P2 segment. High-grade right P3 segment stenosis, at a bifurcation. No acute infarct in this region by previous brain MRI. Venous sinuses: No acute finding Anatomic variants: Incomplete circle-of-Willis. Small anterior communicating artery present. No posterior communicating arteries are seen. Delayed phase: No abnormal intracranial enhancement. Review of the MIP images confirms the above findings IMPRESSION: 1. No branch occlusion or flow limiting stenosis to explain the recent small frontal infarct. 2. Subtle but  convincing delayed opacification of the left vertebral artery, correlating with previous Doppler findings. This could be related to stenosis at the origin, but the lumen is intermittently and asymmetrically irregular, remote dissection is also considered. Left vertebral findings have an overall chronic appearance as noted above. The dominant right vertebral artery is widely patent to the basilar. 3. Advanced chronic microvascular disease in the cerebral white matter. 4. Bilateral PCA atheromatous narrowings, mild on the left and advanced on the right. 5. Brachiocephalic and left common carotid origins were not covered. Electronically Signed   By: Monte Fantasia M.D.   On: 11/13/2016 12:47   Ct Angio Neck W Or Wo  Contrast  Result Date: 11/13/2016 CLINICAL DATA:  One week history of confusion. EXAM: CT ANGIOGRAPHY HEAD AND NECK TECHNIQUE: Multidetector CT imaging of the head and neck was performed using the standard protocol during bolus administration of intravenous contrast. Multiplanar CT image reconstructions and MIPs were obtained to evaluate the vascular anatomy. Carotid stenosis measurements (when applicable) are obtained utilizing NASCET criteria, using the distal internal carotid diameter as the denominator. CONTRAST:  75 cc Isovue 370 intravenous COMPARISON:  Brain MRI from yesterday FINDINGS: CT HEAD FINDINGS Brain: No visible acute infarction (finding on previous brain MRI is below CT threshold), hemorrhage, hydrocephalus, extra-axial collection or mass lesion/mass effect. Extensive white matter low-density in the bilateral cerebral hemispheres consistent with chronic microvascular disease. Vascular: See below Skull: No acute or aggressive finding Sinuses: No acute finding Orbits: Right cataract resection and scleral band. Review of the MIP images confirms the above findings CTA NECK FINDINGS Aortic arch: The arch is incompletely covered. The brachiocephalic left common carotid origins are not seen. Right carotid system: Mild scattered atherosclerotic plaque, greatest at the ICA bulb. The ICA is tortuous. No flow limiting stenosis, ulceration, or dissection. Left carotid system: Origin not visualized. Moderate calcified plaque at the common carotid bifurcation and ICA bulb without flow limiting stenosis, ulceration, or dissection. Vertebral arteries: No visualized proximal subclavian flow limiting stenosis. The right vertebral artery is dominant. There is prominent narrowing of the left vertebral artery origin. At the level of the dura, there is visible less intense opacification of the vertebral arteries lumen. Best visualized on reformats is intermittent irregularity of the lumen, especially prominent at the  level of C3. This finding is asymmetric. Abnormal flow has been seen in this vessel since at least 2016 brain MRI and there is no acute infarcts on recent brain MRI. Skeleton: No acute or aggressive finding Other neck: 33 mm left thyroid mass, size similar to chest CT from 2013. Upper chest: Saber trachea from COPD. Subpleural opacity at the right apex is likely scarring. Review of the MIP images confirms the above findings CTA HEAD FINDINGS Anterior circulation: Atherosclerotic calcification on the carotid siphons. No major branch occlusion or flow limiting stenosis. Negative for aneurysm. Notably negative ACA circulation in light of previous infarct. Posterior circulation: Less intense opacification of the left vertebral artery as described above. Mild atheromatous type narrowing of the left P2 segment. High-grade right P3 segment stenosis, at a bifurcation. No acute infarct in this region by previous brain MRI. Venous sinuses: No acute finding Anatomic variants: Incomplete circle-of-Willis. Small anterior communicating artery present. No posterior communicating arteries are seen. Delayed phase: No abnormal intracranial enhancement. Review of the MIP images confirms the above findings IMPRESSION: 1. No branch occlusion or flow limiting stenosis to explain the recent small frontal infarct. 2. Subtle but convincing delayed opacification of the left vertebral artery, correlating  with previous Doppler findings. This could be related to stenosis at the origin, but the lumen is intermittently and asymmetrically irregular, remote dissection is also considered. Left vertebral findings have an overall chronic appearance as noted above. The dominant right vertebral artery is widely patent to the basilar. 3. Advanced chronic microvascular disease in the cerebral white matter. 4. Bilateral PCA atheromatous narrowings, mild on the left and advanced on the right. 5. Brachiocephalic and left common carotid origins were not  covered. Electronically Signed   By: Monte Fantasia M.D.   On: 11/13/2016 12:47    Assessment & Plan:   Cayson was seen today for edema.  Diagnoses and all orders for this visit:  Edema, unspecified type -     DG Chest 2 View; Future -     furosemide (LASIX) 20 MG tablet; Take 1 tablet (20 mg total) by mouth daily. -     potassium chloride (K-DUR) 10 MEQ tablet; Take 1 tablet (10 mEq total) by mouth daily.  SOB (shortness of breath) -     DG Chest 2 View; Future  Chest rales -     DG Chest 2 View; Future   I am having Mr. Thrun start on furosemide and potassium chloride. I am also having him maintain his nitroGLYCERIN, Cholecalciferol, albuterol, OXYGEN, ipratropium-albuterol, metFORMIN, aspirin, donepezil, atorvastatin, FLUTTER, tamsulosin, divalproex, clopidogrel, and polyethylene glycol powder.  Meds ordered this encounter  Medications  . furosemide (LASIX) 20 MG tablet    Sig: Take 1 tablet (20 mg total) by mouth daily.    Dispense:  4 tablet    Refill:  0    Order Specific Question:   Supervising Provider    Answer:   Cassandria Anger [[id number]]  . potassium chloride (K-DUR) 10 MEQ tablet    Sig: Take 1 tablet (10 mEq total) by mouth daily.    Dispense:  4 tablet    Refill:  0    Order Specific Question:   Supervising Provider    Answer:   Cassandria Anger [[id number]]    Follow-up: No Follow-up on file.  Wilfred Lacy, NP

## 2017-04-02 ENCOUNTER — Telehealth: Payer: Self-pay | Admitting: Nurse Practitioner

## 2017-04-02 NOTE — Telephone Encounter (Signed)
Please schedule patient for re eval of leg swelling and redness

## 2017-04-02 NOTE — Telephone Encounter (Signed)
Called both phone numbers.  No VM on Cell.  I was able to leave vm on home informing patient to call back to make appt.

## 2017-04-02 NOTE — Telephone Encounter (Signed)
Pt wife called stating they saw Luis [name] on 6/19, his symptoms has worsened and they would like a call back   Try home first then cell

## 2017-04-02 NOTE — Telephone Encounter (Signed)
Spoke with spouse, pt feels better while lasix and K dor but his right leg swelling/red more than when he came in. Left is just a little swollen. Please advise.

## 2017-04-03 NOTE — Telephone Encounter (Signed)
Spoke with pt's wife, she said his feet is not as swelling as it was yesterday. She going to wait and look at how it will be today before she call and make an appt with [place]. FYI

## 2017-04-03 NOTE — Telephone Encounter (Signed)
Pt wife called back and stated that his foot is not swollen today like it was yesterday, she would like to know if [place] still wants to see him and she would like a call back      Best phone number [phone]

## 2017-04-05 ENCOUNTER — Ambulatory Visit (INDEPENDENT_AMBULATORY_CARE_PROVIDER_SITE_OTHER): Payer: Medicare Other | Admitting: Nurse Practitioner

## 2017-04-05 ENCOUNTER — Other Ambulatory Visit (INDEPENDENT_AMBULATORY_CARE_PROVIDER_SITE_OTHER): Payer: Medicare Other

## 2017-04-05 ENCOUNTER — Encounter: Payer: Self-pay | Admitting: Nurse Practitioner

## 2017-04-05 VITALS — BP 136/60 | HR 56 | Temp 97.7°F | Ht 71.0 in | Wt 167.0 lb

## 2017-04-05 DIAGNOSIS — R609 Edema, unspecified: Secondary | ICD-10-CM

## 2017-04-05 DIAGNOSIS — M25471 Effusion, right ankle: Secondary | ICD-10-CM | POA: Diagnosis not present

## 2017-04-05 DIAGNOSIS — M25472 Effusion, left ankle: Secondary | ICD-10-CM | POA: Diagnosis not present

## 2017-04-05 LAB — BASIC METABOLIC PANEL
BUN: 14 mg/dL (ref 6–23)
CHLORIDE: 97 meq/L (ref 96–112)
CO2: 37 meq/L — AB (ref 19–32)
Calcium: 9.6 mg/dL (ref 8.4–10.5)
Creatinine, Ser: 0.87 mg/dL (ref 0.40–1.50)
GFR: 89.46 mL/min (ref >60.00)
GLUCOSE: 106 mg/dL — AB (ref 70–99)
Potassium: 4.4 mEq/L (ref 3.5–5.1)
SODIUM: 138 meq/L (ref 135–145)

## 2017-04-05 MED ORDER — POTASSIUM CHLORIDE ER 10 MEQ PO TBCR: 10.0000 meq | EXTENDED_RELEASE_TABLET | Freq: Every day | ORAL | 0 refills | Status: DC

## 2017-04-05 MED ORDER — FUROSEMIDE 20 MG PO TABS: 10.0000 mg | ORAL_TABLET | Freq: Every day | ORAL | 0 refills | Status: DC

## 2017-04-05 MED ORDER — MEDICAL COMPRESSION SOCKS MISC: 1.0000 "application " | Freq: Every day | 0 refills | Status: DC

## 2017-04-05 NOTE — Patient Instructions (Signed)
[facility] in [place], [place]. [place]  [phone]  Open ? Closes 4:30PM  Wear compression socking during the day and off at night. Also elevate legs as much as possible  Go to basement for blood draw. Will review lab results prior to refilling lasix and potassium.

## 2017-04-05 NOTE — Progress Notes (Signed)
Subjective:  Patient ID: [name], male    DOB: 02-Feb-1934  Age: 83 y.o. MRN: [id number]  CC: Leg Swelling (leg swelling,redness on left legs )   HPI Edema: Some improvement with use of furosemide. Concerned about redness of skin. Complete lasix as prescribed.  Outpatient Medications Prior to Visit  Medication Sig Dispense Refill  . aspirin EC 325 MG EC tablet Take 1 tablet (325 mg total) by mouth daily. 30 tablet 0  . atorvastatin (LIPITOR) 80 MG tablet Take 1 tablet (80 mg total) by mouth daily. 90 tablet 3  . Cholecalciferol (D3 MAXIMUM STRENGTH) 5000 UNITS capsule Take 5,000 Units by mouth daily.    . clopidogrel (PLAVIX) 75 MG tablet TAKE ONE TABLET BY MOUTH DAILY. 30 tablet 5  . donepezil (ARICEPT) 10 MG tablet Take 1 tablet daily 90 tablet 3  . ipratropium-albuterol (DUONEB) 0.5-2.5 (3) MG/3ML SOLN INHALE CONTENTS OF 1 VIAL IN NEBULIZER EVERY 6 HOURS AS NEEDED. 90 mL 11  . nitroGLYCERIN (NITROSTAT) 0.4 MG SL tablet Place 0.4 mg under the tongue every 5 (five) minutes as needed for chest pain. Reported on 03/19/2016    . OXYGEN Inhale into the lungs. On 2 liters during the day and at night    . polyethylene glycol powder (GLYCOLAX/MIRALAX) powder TAKE 17 GRAMS (1 CAPFUL) IN 8 OZ OF FLUID ONCE DAILY 527 g 2  . Respiratory Therapy Supplies (FLUTTER) DEVI Blow through as directed 1 each 0  . tamsulosin (FLOMAX) 0.4 MG CAPS capsule TAKE ONE CAPSULE BY MOUTH DAILY. 90 capsule 1  . metFORMIN (GLUCOPHAGE-XR) 500 MG 24 hr tablet Take 1 tablet by mouth daily.    [name] albuterol (PROVENTIL HFA;VENTOLIN HFA) 108 (90 BASE) MCG/ACT inhaler Inhale 2 puffs into the lungs every 6 (six) hours as needed for wheezing or shortness of breath. (Patient not taking: Reported on 04/05/2017) 1 Inhaler 11  . divalproex (DEPAKOTE ER) 250 MG 24 hr tablet Take 1 tablet at night (Patient not taking: Reported on 03/26/2017) 30 tablet 6  . furosemide (LASIX) 20 MG tablet Take 1 tablet (20 mg total) by mouth daily.  (Patient not taking: Reported on 04/05/2017) 4 tablet 0  . potassium chloride (K-DUR) 10 MEQ tablet Take 1 tablet (10 mEq total) by mouth daily. (Patient not taking: Reported on 04/05/2017) 4 tablet 0   No facility-administered medications prior to visit.     ROS Review of Systems  Constitutional: Negative for chills, diaphoresis, fever and malaise/fatigue.  Respiratory: Positive for shortness of breath.        Chronic SOB, but stable  Cardiovascular: Positive for leg swelling. Negative for chest pain and claudication.  Musculoskeletal: Negative for falls, joint pain and myalgias.  Neurological: Negative for dizziness.    Objective:  BP 136/60   Pulse (!) 56   Temp 97.7 F (36.5 C)   Ht 5\' 11"  (1.803 m)   Wt 167 lb (75.8 kg)   SpO2 95%   BMI 23.29 kg/m   BP Readings from Last 3 Encounters:  04/05/17 136/60  03/26/17 (!) 142/80  01/22/17 130/78    Wt Readings from Last 3 Encounters:  04/05/17 167 lb (75.8 kg)  03/26/17 167 lb (75.8 kg)  01/22/17 169 lb (76.7 kg)    Physical Exam  Constitutional: He is oriented to person, place, and time. No distress.  Neck: No JVD present.  Cardiovascular: Normal rate and regular rhythm.   Pulmonary/Chest: Effort normal. No respiratory distress. He has no wheezes. He has rales. He  exhibits no tenderness.  Musculoskeletal: He exhibits edema. He exhibits no tenderness.  Neurological: He is alert and oriented to person, place, and time.  Skin: Skin is warm and dry. No rash noted. No erythema.    Lab Results  Component Value Date   WBC 8.3 12/19/2016   HGB 17.1 (H) 12/19/2016   HCT 51.6 12/19/2016   PLT 211.0 12/19/2016   GLUCOSE 106 (H) 04/05/2017   CHOL 129 12/19/2016   TRIG 69.0 12/19/2016   HDL 50.50 12/19/2016   LDLCALC 64 12/19/2016   ALT 17 12/19/2016   AST 20 12/19/2016   NA 138 04/05/2017   K 4.4 04/05/2017   CL 97 04/05/2017   CREATININE 0.87 04/05/2017   BUN 14 04/05/2017   CO2 37 (H) 04/05/2017   TSH 1.809  11/12/2016   PSA 3.48 12/19/2016   INR 1.04 11/11/2016   HGBA1C 6.7 (H) 12/19/2016   MICROALBUR 19.4 (H) 12/19/2016    Dg Chest 2 View  Result Date: 03/26/2017 CLINICAL DATA:  Chronic shortness of breath and productive cough EXAM: CHEST  2 VIEW COMPARISON:  07/13/2016 FINDINGS: Cardiac shadow is within normal limits. The lungs are mildly hyperinflated consistent with COPD. No focal infiltrate or sizable effusion is noted. No acute bony abnormality is seen. Mild stable interstitial changes are noted. IMPRESSION: No acute abnormality noted.  No change from the prior exam. Electronically Signed   By: Inez Catalina M.D.   On: 03/26/2017 14:12    Assessment & Plan:   Zebastian was seen today for leg swelling.  Diagnoses and all orders for this visit:  Ankle edema, bilateral -     Basic metabolic panel; Future -     Elastic Bandages & Supports (MEDICAL COMPRESSION SOCKS) MISC; 1 application by Does not apply route daily. Medium compression -     furosemide (LASIX) 20 MG tablet; Take 0.5 tablets (10 mg total) by mouth daily. -     potassium chloride (K-DUR) 10 MEQ tablet; Take 1 tablet (10 mEq total) by mouth daily.   I have discontinued Mr. Tullis's albuterol and divalproex. I have also changed his furosemide. Additionally, I am having him start on Medical Compression Socks. Lastly, I am having him maintain his nitroGLYCERIN, Cholecalciferol, OXYGEN, ipratropium-albuterol, metFORMIN, aspirin, donepezil, atorvastatin, FLUTTER, tamsulosin, clopidogrel, polyethylene glycol powder, and potassium chloride.  Meds ordered this encounter  Medications  . Elastic Bandages & Supports (MEDICAL COMPRESSION SOCKS) MISC    Sig: 1 application by Does not apply route daily. Medium compression    Dispense:  2 each    Refill:  0    Order Specific Question:   Supervising Provider    Answer:   Cassandria Anger [[id number]]  . furosemide (LASIX) 20 MG tablet    Sig: Take 0.5 tablets (10 mg total) by mouth daily.      Dispense:  15 tablet    Refill:  0    Order Specific Question:   Supervising Provider    Answer:   Cassandria Anger [[id number]]  . potassium chloride (K-DUR) 10 MEQ tablet    Sig: Take 1 tablet (10 mEq total) by mouth daily.    Dispense:  30 tablet    Refill:  0    Order Specific Question:   Supervising Provider    Answer:   Cassandria Anger [[id number]]    Follow-up: Return if symptoms worsen or fail to improve.  Wilfred Lacy, NP

## 2017-04-17 ENCOUNTER — Other Ambulatory Visit: Payer: Self-pay | Admitting: Family

## 2017-05-22 ENCOUNTER — Ambulatory Visit (INDEPENDENT_AMBULATORY_CARE_PROVIDER_SITE_OTHER): Payer: Medicare Other | Admitting: Neurology

## 2017-05-22 ENCOUNTER — Encounter: Payer: Self-pay | Admitting: Neurology

## 2017-05-22 VITALS — BP 138/64 | HR 86 | Ht 71.0 in | Wt 172.0 lb

## 2017-05-22 DIAGNOSIS — F0151 Vascular dementia with behavioral disturbance: Secondary | ICD-10-CM

## 2017-05-22 DIAGNOSIS — F01518 Vascular dementia, unspecified severity, with other behavioral disturbance: Secondary | ICD-10-CM

## 2017-05-22 NOTE — Progress Notes (Signed)
NEUROLOGY FOLLOW UP OFFICE NOTE  Luis [name] [id number]  HISTORY OF PRESENT ILLNESS: I had the pleasure of seeing Luis [name] in follow-up in the neurology clinic on 05/22/2017.  The patient was last seen 6 months ago after a small stroke and memory changes. He is again accompanied by his wife and son who help supplement the history today. MMSE in February 2018 was 25/30 (24/30 in October 2017, 27/30 in March 2017). He is tolerating Aricept 81m daily without side effects. Since his last visit, his wife had called last April to report that he was very confused, thinking he was some place else. He was worse at night, forgetting his way to the bedroom or bathroom. His wife reported showers were a big problem, he would fight her. One time he woke up in the middle of the night and moved things around the house clinging to the remote control of the security system. He seemed to think they were living [place], which was sold the year prior. We discussed starting Depakote for behavioral changes, it is not on his medication list, his wife does not recall the side effects but appears he only took it for a few days. They continue to report worsening memory, he lost his glasses the other day. His wife left him for a few minutes and asked him to call her as a test to see if he would be fine by himself, he did not call her. She does not leave him alone anymore. He states he can shower alone, but his wife has to come and help because he can't breathe and she stands outside holding his oxygen, reminding him to work through washing different body parts. She lays out his clothes, he can dress himself independently. His wife is in charge of bills and medications. He does not drive. He would get upset with himself when he cannot remember things. His family is more concerned that they cannot go anywhere because he would be stubborn with difficulty getting him ready to go out. His wife has had to stay home a  few times because of this. Sometimes when they would have visitors, 2-3 days later he asks is someone was in the house. No clear hallucinations. He denies any headaches, dizziness, diplopia, dysarthria, dysphagia, neck/back pain, focal numbness/tingling/weakness, bowel/bladder dysfunction. No falls.  HPI 07/26/15: This is a very pleasant 83yo RH man with a history of hypertension, hyperlipidemia, diabetes, COPD, who presented for evaluation of worsening memory and hospital follow-up after recent stroke in October 2016. He feels his memory "could be better," he sometimes has to think when asked a question. His family reports memory changes started over the past year, he would forget names, conversations, ask the same questions. He would misplace things. His wife has recently started to pay attention to make sure he takes his medications, but he denies missing any doses. He continues to balance their checkbook and denies any missed bill payments. Prior to the stroke, he was driving and denied getting lost, but family has noticed his wife was doing more of the driving. He has occasional word-finding difficulties. He denies any difficulties with ADLs. On 07/15/15, they were at a picnic when family noticed that something was "not right," he had rambling speech and was not making sense, starting off a sentence then drifting off. There was no slurred speech or focal weakness noted. [facility] in the left  external capsule. I personally reviewed MRI brain, in addition there was moderate chronic microvascular disease. Carotid dopplers showed less than 50% stenosis bilaterally, mild plaque at the level of the carotid bulbs and ICA as well as the right common carotid artery. Echocardiogram showed EF 65-70%, normal left atrium. He had been taking low dose aspirin, and was switched to full dose 326m aspirin. Lipid panel showed total cholesterol 164, LDL 88.  Simvastatin dose increased to 298mdaily. HbA1c 6.8. He was also diagnosed with hyponatremia with sodium of 128 and hypoxia from COPD exacerbation. On hospital discharge, sodium level was 132. He was back to cognitive baseline. He denies any significant head injuries. No family history of dementia. He has always had a poor sense of smell.   [facility] His wife reports that he had more confusion than baseline. He was watching the Super Bowl when she noticed he was rubbing his forehead and she asked if he had a headache. He stood up to go to the bathroom and could not find it. Since similar symptoms occurred with prior stroke, his wife called EMS. [facility] which I personally reviewed, there was a punctate focus of restricted diffusion in the medial anterior right frontal lobe. There was diffuse atrophy and severe age-advanced chronic microvascular disease with confluent white matter changes seen. Stroke workup was unremarkable with echocardiogram showing EF 5554-27%grade 1 diastolic dysfunction, normal left atrium. Carotid dopplers did not show any significant stenosis, estimated less than 50% bilaterally. He was evaluated by neurologist Dr. DoMerlene Laughterit was doubtful that the tiny frontal infarct was the cause of his marked encephalopathy. His EEG showed mild diffuse slowing. Bloodwork for vasculitis showed normal ESR, CRP, homocysteine, RPR, ANA. Normal TSH and B12. He was discharged home on aspirin 32510maily and Plavix.   Diagnostic Data: Brain MRA did not show any branch occlusion or flow limiting stenosis to explain the recent small frontal infarct. There was subtle but convincing delayed opacification of the left vertebral artery, correlating with previous Doppler findings. This could be related to stenosis at the origin, but the lumen is intermittently and asymmetrically irregular, remote dissection is also considered. Left vertebral findings have  an overall chronic appearance as noted above. The dominant right vertebral artery is widely patent to the basilar. Advanced chronic microvascular disease in the cerebral white matter. Bilateral PCA atheromatous narrowings, mild on the left and advanced on the right.  PAST MEDICAL HISTORY: Past Medical History:  Diagnosis Date  . Arthritis   . COPD (chronic obstructive pulmonary disease) ([facility] . Diabetes mellitus without complication ([facility] . Dysphagia   . Emphysema   . [place]ma of lung ([place] . Exertional shortness of breath   . GERD (gastroesophageal reflux disease)   . Heart murmur    as a child  . High cholesterol   . Hypertension     MEDICATIONS: Current Outpatient Prescriptions on File Prior to Visit  Medication Sig Dispense Refill  . aspirin EC 325 MG EC tablet Take 1 tablet (325 mg total) by mouth daily. 30 tablet 0  . atorvastatin (LIPITOR) 80 MG tablet Take 1 tablet (80 mg total) by mouth daily. 90 tablet 3  . Cholecalciferol (D3 MAXIMUM STRENGTH) 5000 UNITS capsule Take 5,000 Units by mouth daily.    . clopidogrel (PLAVIX) 75 MG tablet TAKE ONE TABLET BY MOUTH DAILY. 30 tablet 5  . donepezil (ARICEPT) 10 MG  tablet Take 1 tablet daily 90 tablet 3  . Elastic Bandages & Supports (MEDICAL COMPRESSION SOCKS) MISC 1 application by Does not apply route daily. Medium compression 2 each 0  . furosemide (LASIX) 20 MG tablet Take 0.5 tablets (10 mg total) by mouth daily. 15 tablet 0  . ipratropium-albuterol (DUONEB) 0.5-2.5 (3) MG/3ML SOLN INHALE CONTENTS OF 1 VIAL IN NEBULIZER EVERY 6 HOURS AS NEEDED. 90 mL 11  . metFORMIN (GLUCOPHAGE-XR) 500 MG 24 hr tablet Take 1 tablet by mouth daily.    . nitroGLYCERIN (NITROSTAT) 0.4 MG SL tablet Place 0.4 mg under the tongue every 5 (five) minutes as needed for chest pain. Reported on 03/19/2016    . OXYGEN Inhale into the lungs. On 2 liters during the day and at night    . polyethylene glycol powder (GLYCOLAX/MIRALAX) powder TAKE 17 GRAMS  (1 CAPFUL) IN 8 OZ OF FLUID ONCE DAILY 527 g 2  . potassium chloride (K-DUR) 10 MEQ tablet Take 1 tablet (10 mEq total) by mouth daily. 30 tablet 0  . Respiratory Therapy Supplies (FLUTTER) DEVI Blow through as directed 1 each 0  . tamsulosin (FLOMAX) 0.4 MG CAPS capsule TAKE ONE CAPSULE BY MOUTH DAILY. 90 capsule 0   No current facility-administered medications on file prior to visit.     ALLERGIES: Allergies  Allergen Reactions  . Tranexamic Acid Anaphylaxis    Had in 09/09/[facility] and had anaphylaxis reaction    FAMILY HISTORY: Family History  Problem Relation Age of Onset  . Stroke Mother   . Healthy Father     SOCIAL HISTORY: Social History   Social History  . Marital status: Married    Spouse name: N/A  . Number of children: 3  . Years of education: 12   Occupational History  . Retired    Social History Main Topics  . Smoking status: Former Smoker    Packs/day: 2.00    Years: 35.00    Types: Cigarettes    Quit date: 10/08/1985  . Smokeless tobacco: Former Systems developer    Types: Chew    Quit date: 10/08/1997  . Alcohol use No  . Drug use: No  . Sexual activity: Not Currently   Other Topics Concern  . Not on file   Social History Narrative   Fun: Golf   Denies abuse and feels safe at home.     REVIEW OF SYSTEMS: Constitutional: No fevers, chills, or sweats, no generalized fatigue, change in appetite Eyes: No visual changes, double vision, eye pain Ear, nose and throat: No hearing loss, ear pain, nasal congestion, sore throat Cardiovascular: No chest pain, palpitations Respiratory:  No shortness of breath at rest or with exertion, wheezes GastrointestinaI: No nausea, vomiting, diarrhea, abdominal pain, fecal incontinence Genitourinary:  No dysuria, urinary retention or frequency Musculoskeletal:  No neck pain, back pain Integumentary: No rash, pruritus, skin lesions Neurological: as above Psychiatric: No depression, insomnia, anxiety Endocrine: No  palpitations, fatigue, diaphoresis, mood swings, change in appetite, change in weight, increased thirst Hematologic/Lymphatic:  No anemia, purpura, petechiae. Allergic/Immunologic: no itchy/runny eyes, nasal congestion, recent allergic reactions, rashes  PHYSICAL EXAM: Vitals:   05/22/17 1001  BP: 138/64  Pulse: 86  SpO2: (!) 83%   General: No acute distress, on O2 nasal cannula Head:  Normocephalic/atraumatic Neck: supple, no paraspinal tenderness, full range of motion Heart:  Regular rate and rhythm Lungs:  Clear to auscultation bilaterally Back: No paraspinal tenderness Skin/Extremities: No rash, no edema Neurological Exam: alert and oriented  to person, place, and month/day of week. No aphasia or dysarthria. Fund of knowledge is appropriate.  Remote memory impaired.  Attention and concentration are normal.    Able to name objects and repeat phrases. CDT 5/5.  MMSE - Mini Mental State Exam 05/22/2017 11/21/2016 07/11/2016  Orientation to time 2 3 3   Orientation to Place 5 5 5   Registration 3 3 3   Attention/ Calculation 3 5 4   Recall 0 0 0  Language- name 2 objects 2 2 2   Language- repeat 1 1 1   Language- follow 3 step command 3 3 3   Language- read & follow direction 1 1 1   Write a sentence 1 1 1   Copy design 1 1 1   Total score 22 25 24    Cranial nerves: Pupils equal, round, reactive to light.  Extraocular movements intact with no nystagmus. Visual fields full. Facial sensation intact. No facial asymmetry. Tongue, uvula, palate midline.  Motor: Fasciculations noted on the left thenar muscles. Bulk and tone normal, muscle strength 5/5 throughout with no pronator drift.  Sensation to light touch intact.  No extinction to double simultaneous stimulation.  Deep tendon reflexes +2 throughout, toes downgoing.  Finger to nose testing intact.  Gait narrow-based and steady, able to tandem walk adequately.  Romberg negative.  IMPRESSION: This is a pleasant 83 yo RH man with a history of  hypertension, hyperlipidemia, diabetes, COPD, with worsening memory and strokes, most recently last February 2018. His MRI brain shows severe chronic microvascular disease. The stroke could be due to small vessel disease, echo and carotid dopplers unremarkable. Vasculitis is also considered, bloodwork normal. Continue maximum medical management with aspirin and Plavix, lipid and BP control. There has been a decline in MMSE, 22/30 today (25/30 in February 2018). We discussed the option of adding Namenda to Aricept, expectations from the medication and side effects. His wife is very hesitant. We discussed goals of care, home safety, and family concerns. They are mostly concerned about his stubbornness, not wanting to do things. We discussed that potentially Depakote can help with mood stabilization, it is unclear if he took it long enough and what side effects he had. His wife will restart and see. Otherwise consider starting an SSRI. He does not drive. We again discussed control of vascular risk factors and daily aspirin, as well as physical exercise and brain stimulation exercises for brain health. We discussed home safety. He will follow-up in 6 months and knows to call our office for any problems.   Thank you for allowing me to participate in his care.  Please do not hesitate to call for any questions or concerns.  The duration of this appointment visit was 25 minutes of face-to-face time with the patient.  Greater than 50% of this time was spent in counseling, explanation of diagnosis, planning of further management, and coordination of care.   Ellouise Newer, M.D.   CC: Mauricio Po, FNP

## 2017-05-22 NOTE — Patient Instructions (Signed)
1. Restart Depakote ER 250mg  every night, call our office in 2 months 2. Continue Donepezil 10mg  daily 3. Continue blood pressure, cholesterol, as well as physical exercise and brain stimulation exercises are important for brain health 4. Follow-up in 6 months

## 2017-06-11 ENCOUNTER — Ambulatory Visit (INDEPENDENT_AMBULATORY_CARE_PROVIDER_SITE_OTHER): Payer: Medicare Other | Admitting: Internal Medicine

## 2017-06-11 ENCOUNTER — Encounter: Payer: Self-pay | Admitting: Internal Medicine

## 2017-06-11 DIAGNOSIS — J449 Chronic obstructive pulmonary disease, unspecified: Secondary | ICD-10-CM | POA: Diagnosis not present

## 2017-06-11 DIAGNOSIS — Z23 Encounter for immunization: Secondary | ICD-10-CM | POA: Diagnosis not present

## 2017-06-11 DIAGNOSIS — J9611 Chronic respiratory failure with hypoxia: Secondary | ICD-10-CM | POA: Diagnosis not present

## 2017-06-11 MED ORDER — ARFORMOTEROL TARTRATE 15 MCG/2ML IN NEBU: 15.0000 ug | INHALATION_SOLUTION | Freq: Two times a day (BID) | RESPIRATORY_TRACT | 6 refills | Status: DC

## 2017-06-11 NOTE — Assessment & Plan Note (Signed)
Requalified today. He can continue to sleep with 2 L but I gave wife permission to use 3 or 4 L when he is up and more active.

## 2017-06-11 NOTE — Progress Notes (Signed)
HPI male former smoker followed for severe COPD/emphysema, vasomotor rhinitis PFT2/5/14- severe obstructive airways disease with slight response to bronchodilator. Air-trapping with increased residual volume. Diffusion mildly reduced. Emphysema pattern. FVC 2.56/59%, FEV1 1.17/41%, FEV1/FVC 0.45. Residual volume 132%, DLCO 78%. 6MWT-11/12/12- 96%, 94%, 96%, 454 m. Good distance with oxygenation well maintained. a1AT- WNL MM 142  6 minute walk test-10/20/15--saturation dropped to 89% on room air by end of walk, 412 m. Borderline but not qualifying for portable O2. Resolving acute bronchitis after doxycycline and Mucinex. Still frequent wheeze. Walk Test on room air 04/18/2016-qualified for portable oxygen with desaturation to 86% and appropriate recovery on 2 L oxygen. Denies cough with meals, eats slowly. ---------------------------------------------------------------------------  12/06/2016- 83 yo - male former smoker followed for COPD mixed type, vasomotor rhinitis, complicated by CVA/mild cognitive impairment, DM O2 2 L sleep/[facility] FOLLOWS FOR: pt c/o chest congestion, stable dyspnea.  pt states that flutter valve is not longer working, which helped to produce mucus.  Flutter device stop working for them and they need a replacement. Breo device hard for him to use after another stroke. Nebulizer works better. Has a portable oxygen concentrator. Not wearing oxygen for sleep currently after 2 episodes of significant epistaxis requiring ENT evaluation and cautery with follow-up pending. He has humidifier on his home concentrator.  CXR 07/13/2016- IMPRESSION: Chronic bronchitic changes. No alveolar pneumonia, CHF, nor other acute cardiopulmonary abnormality  06/11/17- 83 yo - male former smoker followed for COPD mixed type, vasomotor rhinitis, complicated by CVA/mild cognitive impairment, DM O2 2 L sleep, 3L awake /[facility]   requalified today FOLLOWS FOR: Pt continues to use O2 thorugh out the day  as well as night. SOB at times still.  Vascular dementia has caused more behavioral issues as reviewed in neurology note. He uses his flutter device most days but always has some deep chest rattle-nonpurulent without fever or acute infection recently. Humid weather this summer has been bothersome. He moves slowly and is mostly at home. [employer] form completed. Wife asked about alternatives to [facility], which is being used twice daily. We discussed Brovana. They deny obvious choking swallowing problems with meals. CXR 03/26/17 Cardiac shadow is within normal limits. The lungs are mildly hyperinflated consistent with COPD. No focal infiltrate or sizable effusion is noted. No acute bony abnormality is seen. Mild stable interstitial changes are noted. IMPRESSION: No acute abnormality noted.  No change from the prior exam  ROS-see HPI  + = positive Constitutional:   No-   weight loss, night sweats, fevers, chills, fatigue, lassitude. HEENT:   No-  headaches, difficulty swallowing, tooth/dental problems, sore throat,       No-  sneezing, itching, ear ache, nasal congestion, post nasal drip,  CV:  No-   chest pain, orthopnea, PND, swelling in lower extremities, anasarca,                                                                            dizziness, palpitations Resp: +shortness of breath with exertion or at rest.             +productive cough,  No non-productive cough,  No- coughing up of blood.  No-   change in color of mucus.  + wheezing.   Skin: No-   rash or lesions. GI:  No-   heartburn, indigestion, abdominal pain, nausea, vomiting, GU: . MS:  No-   joint pain or swelling.  . Neuro-     nothing unusual Psych:  No- change in mood or affect. No depression or anxiety.  No memory loss.  OBJ- Physical Exam General- Alert, Oriented, Affect-appropriate, Distress- none acute, trim, POC O2 3 L Skin- rash-none, lesions- none, excoriation- none Lymphadenopathy- none Head-  atraumatic            Eyes- Gross vision intact, PERRLA, conjunctivae and secretions clear            Ears- Hearing, canals-normal            Nose- Clear, no-Septal dev, mucus, polyps, erosion, perforation             Throat- Mallampati II , mucosa clear , drainage- none, tonsils- atrophic Neck- flexible , trachea midline, no stridor , thyroid nl, carotid no bruit Chest - symmetrical excursion , unlabored           Heart/CV- RRR , no murmur , no gallop  , no rub, nl s1 s2                           - JVD- none , edema- none, stasis changes- none, varices- none           Lung-  +diminished, wheeze- none, cough + deep loose, dullness-none, rub- none           Chest wall-  Abd-  Br/ Gen/ Rectal- Not done, not indicated Extrem- cyanosis- none, clubbing, none, atrophy- none, strength- nl Neuro- + nonfocal sitting quietly in exam room. Speech seems normal during simple conversation. Wife volunteers a lot of information.

## 2017-06-11 NOTE — Assessment & Plan Note (Signed)
There is a persistent chronic bronchitis pattern and he has to work to keep up with secretions. I do think he is at baseline. Plan-try Brovana as a comparison to [employer], flu shot

## 2017-06-11 NOTE — Patient Instructions (Addendum)
Flu shot- hi dose   Ok to continue O2 2L for sleep and 3-4 L when awake.  Continue to use the Flutter device to clear secretions  Script to try Brovana neb solution as an alternative to the ipratropium-albuterol (DuoNeb) that you are using now.   Please call if we can help

## 2017-07-11 ENCOUNTER — Ambulatory Visit: Payer: Medicare Other | Admitting: Neurology

## 2017-08-14 ENCOUNTER — Other Ambulatory Visit: Payer: Self-pay | Admitting: Dermatology

## 2017-08-16 ENCOUNTER — Ambulatory Visit: Payer: Medicare Other | Admitting: Nurse Practitioner

## 2017-08-16 ENCOUNTER — Encounter: Payer: Self-pay | Admitting: Nurse Practitioner

## 2017-08-16 ENCOUNTER — Ambulatory Visit (INDEPENDENT_AMBULATORY_CARE_PROVIDER_SITE_OTHER)
Admission: RE | Admit: 2017-08-16 | Discharge: 2017-08-16 | Disposition: A | Discharge status: Home / Self Care | Payer: Medicare Other | Source: Ambulatory Visit | Attending: Nurse Practitioner | Admitting: Nurse Practitioner

## 2017-08-16 VITALS — BP 130/82 | HR 50 | Temp 97.8°F | Resp 18 | Ht 71.0 in | Wt 169.0 lb

## 2017-08-16 DIAGNOSIS — J449 Chronic obstructive pulmonary disease, unspecified: Secondary | ICD-10-CM

## 2017-08-16 DIAGNOSIS — Z23 Encounter for immunization: Secondary | ICD-10-CM

## 2017-08-16 NOTE — Patient Instructions (Addendum)
Please head downstairs for x-rays. I have ordered a chest x-ray to make sure there is no fluid or infection. We will not make any changes to your medications today.  Please call pulmonology to see if they can see you for a follow up before March.  Id like to see you back in about 3 months unless you need me sooner.  It was very nice to meet you. Who presents today to establish care.  Is transferring to me from another provider in the same clinic.

## 2017-08-16 NOTE — Assessment & Plan Note (Signed)
Maintained on Brovana on home O2 which he will continue. He plans to increase use of his flutter device. CXR today to rule out treatable causes of worsening SOB. Discussed this could just be due to natural disease progression. Encouraged following up with pulm sooner than march if he continues to feel more SOB

## 2017-08-16 NOTE — Progress Notes (Signed)
Subjective:    Patient ID: Luis [name], male    DOB: [birth year], 83 y.o.   MRN: [id number]  HPI Luis [name] is an 83 yo male who presents today to establish care. He is transferring to me from another provider in the same clinic.  COPD- mixed type. Feels his breathing has been worse over past several weeks. Wife has noticed him "gasping" for air at times. Hes on 3L O2 [place] continuously at daytime and 2L continuously at nighttime. He is followed by pulmonology for COPD. He was seen in the office in September and started on brovana as a replacement for his duoneb. He felt improvement with the brovana initially - less cough and easier to breathe. Hes also been using a flutter device with some improvement but does not use it consistently. He denies fevers, malaise, fatigue, cyanosis, chest pain, edema. He plans to see pulmonology again in March.  Review of Systems  See HPI  Past Medical History:  Diagnosis Date  . Arthritis   . COPD (chronic obstructive pulmonary disease) ([place])   . Diabetes mellitus without complication ([place])   . Dysphagia   . Emphysema   . Emphysema of lung ([place])   . Exertional shortness of breath   . GERD (gastroesophageal reflux disease)   . Heart murmur    as a child  . High cholesterol   . Hypertension      Social History   Socioeconomic History  . Marital status: Married    Spouse name: Not on file  . Number of children: 3  . Years of education: 38  . Highest education level: Not on file  Social Needs  . Financial resource strain: Not on file  . Food insecurity - worry: Not on file  . Food insecurity - inability: Not on file  . Transportation needs - medical: Not on file  . Transportation needs - non-medical: Not on file  Occupational History  . Occupation: Retired  Tobacco Use  . Smoking status: Former Smoker    Packs/day: 2.00    Years: 35.00    Pack years: 70.00    Types: Cigarettes    Last attempt to quit: 10/08/1985    Years since quitting:  31.8  . Smokeless tobacco: Former Systems developer    Types: [place] date: 10/08/1997  Substance and Sexual Activity  . Alcohol use: No    Alcohol/week: 1.2 oz    Types: 2 Standard drinks or equivalent per week  . Drug use: No  . Sexual activity: Not Currently  Other Topics Concern  . Not on file  Social History Narrative   Fun: Golf   Denies abuse and feels safe at home.     Past Surgical History:  Procedure Laterality Date  . CATARACT EXTRACTION W/ INTRAOCULAR LENS IMPLANT Left 2011  . ESOPHAGEAL DILATION  2000's   x2  . EYE SURGERY    . INGUINAL HERNIA REPAIR Right 1970's  . INGUINAL HERNIA REPAIR Bilateral 03/12/2013   w/mesh bilaterally/notes 03/12/2013  . RETINAL DETACHMENT SURGERY Right 08/29/2010  . TONSILLECTOMY  1940's  . TRANSURETHRAL RESECTION OF PROSTATE  2002; 2013    Family History  Problem Relation Age of Onset  . Stroke Mother   . Healthy Father     Allergies  Allergen Reactions  . Tranexamic Acid Anaphylaxis    Had in 09/09/2016 in [facility] ED and had anaphylaxis reaction    Current Outpatient Medications on File Prior to Visit  Medication Sig  Dispense Refill  . arformoterol (BROVANA) 15 MCG/2ML NEBU Take 2 mLs (15 mcg total) by nebulization 2 (two) times daily. 120 mL 6  . aspirin EC 325 MG EC tablet Take 1 tablet (325 mg total) by mouth daily. 30 tablet 0  . atorvastatin (LIPITOR) 80 MG tablet Take 1 tablet (80 mg total) by mouth daily. 90 tablet 3  . Cholecalciferol (D3 MAXIMUM STRENGTH) 5000 UNITS capsule Take 5,000 Units by mouth daily.    . clopidogrel (PLAVIX) 75 MG tablet TAKE ONE TABLET BY MOUTH DAILY. 30 tablet 5  . divalproex (DEPAKOTE ER) 250 MG 24 hr tablet Take 250 mg by mouth daily.    [name] donepezil (ARICEPT) 10 MG tablet Take 1 tablet daily 90 tablet 3  . ipratropium-albuterol (DUONEB) 0.5-2.5 (3) MG/3ML SOLN INHALE CONTENTS OF 1 VIAL IN NEBULIZER EVERY 6 HOURS AS NEEDED. 90 mL 11  . nitroGLYCERIN (NITROSTAT) 0.4 MG SL tablet Place 0.4 mg under the  tongue every 5 (five) minutes as needed for chest pain. Reported on 03/19/2016    . OXYGEN Inhale into the lungs. On 2 liters during the day and at night    . polyethylene glycol powder (GLYCOLAX/MIRALAX) powder TAKE 17 GRAMS (1 CAPFUL) IN 8 OZ OF FLUID ONCE DAILY 527 g 2  . Respiratory Therapy Supplies (FLUTTER) DEVI Blow through as directed 1 each 0  . tamsulosin (FLOMAX) 0.4 MG CAPS capsule TAKE ONE CAPSULE BY MOUTH DAILY. 90 capsule 0   No current facility-administered medications on file prior to visit.     BP 130/82 (BP Location: Left Arm, Patient Position: Sitting, Cuff Size: Large)   Pulse (!) 50   Temp 97.8 F (36.6 C) (Oral)   Resp 18   Ht 5\' 11"  (1.803 m)   Wt 169 lb (76.7 kg)   SpO2 94%   BMI 23.57 kg/m      Objective:   Physical Exam  Constitutional: He is oriented to person, place, and time. He appears well-developed and well-nourished.  On 3L [place] continuously  HENT:  Head: Normocephalic and atraumatic.  Cardiovascular: Regular rhythm, normal heart sounds and intact distal pulses.  Normal capillary refill.  Pulmonary/Chest: No respiratory distress. He has no wheezes. He exhibits no tenderness.  Mild sob at rest, diminished breath sounds bilaterally.  Musculoskeletal: He exhibits no edema.  Neurological: He is alert and oriented to person, place, and time. Coordination normal.  Skin: Skin is warm and dry. No cyanosis.  Psychiatric: He has a normal mood and affect. Judgment and thought content normal.      Assessment & Plan:  TDAP given today.  He will return for follow up of chronic conditions in 3 months or sooner if needed.

## 2017-08-22 ENCOUNTER — Telehealth: Payer: Self-pay | Admitting: Internal Medicine

## 2017-08-22 NOTE — Telephone Encounter (Signed)
lmtcb x1 for pt. Please schedule pt for 08/23/17 at 10a

## 2017-08-22 NOTE — Telephone Encounter (Signed)
Spoke with pt's spouse Remo Lipps, who states pt's PCP would like pt to f/u with CY sooner then 12/2016 due to increased sob.  CY please advise. Thanks.   Current Outpatient Medications on File Prior to Visit  Medication Sig Dispense Refill  . arformoterol (BROVANA) 15 MCG/2ML NEBU Take 2 mLs (15 mcg total) by nebulization 2 (two) times daily. 120 mL 6  . aspirin EC 325 MG EC tablet Take 1 tablet (325 mg total) by mouth daily. 30 tablet 0  . atorvastatin (LIPITOR) 80 MG tablet Take 1 tablet (80 mg total) by mouth daily. 90 tablet 3  . Cholecalciferol (D3 MAXIMUM STRENGTH) 5000 UNITS capsule Take 5,000 Units by mouth daily.    . clopidogrel (PLAVIX) 75 MG tablet TAKE ONE TABLET BY MOUTH DAILY. 30 tablet 5  . divalproex (DEPAKOTE ER) 250 MG 24 hr tablet Take 250 mg by mouth daily.    [name] donepezil (ARICEPT) 10 MG tablet Take 1 tablet daily 90 tablet 3  . ipratropium-albuterol (DUONEB) 0.5-2.5 (3) MG/3ML SOLN INHALE CONTENTS OF 1 VIAL IN NEBULIZER EVERY 6 HOURS AS NEEDED. 90 mL 11  . nitroGLYCERIN (NITROSTAT) 0.4 MG SL tablet Place 0.4 mg under the tongue every 5 (five) minutes as needed for chest pain. Reported on 03/19/2016    . OXYGEN Inhale into the lungs. On 2 liters during the day and at night    . polyethylene glycol powder (GLYCOLAX/MIRALAX) powder TAKE 17 GRAMS (1 CAPFUL) IN 8 OZ OF FLUID ONCE DAILY 527 g 2  . Respiratory Therapy Supplies (FLUTTER) DEVI Blow through as directed 1 each 0  . tamsulosin (FLOMAX) 0.4 MG CAPS capsule TAKE ONE CAPSULE BY MOUTH DAILY. 90 capsule 0   No current facility-administered medications on file prior to visit.     Allergies  Allergen Reactions  . Tranexamic Acid Anaphylaxis    Had in 09/09/2016 in [facility] ED and had anaphylaxis reaction

## 2017-08-22 NOTE — Telephone Encounter (Signed)
Pt can be seen tomorrow Friday 08/23/17 at 10:00am slot. Thanks.

## 2017-08-23 ENCOUNTER — Encounter: Payer: Self-pay | Admitting: Internal Medicine

## 2017-08-23 ENCOUNTER — Ambulatory Visit: Payer: Medicare Other | Admitting: Internal Medicine

## 2017-08-23 VITALS — BP 130/78 | HR 64 | Ht 71.0 in | Wt 167.0 lb

## 2017-08-23 DIAGNOSIS — G3184 Mild cognitive impairment, so stated: Secondary | ICD-10-CM | POA: Diagnosis not present

## 2017-08-23 DIAGNOSIS — J441 Chronic obstructive pulmonary disease with (acute) exacerbation: Secondary | ICD-10-CM

## 2017-08-23 MED ORDER — FLUTICASONE-UMECLIDIN-VILANT 100-62.5-25 MCG/INH IN AEPB: 1.0000 | INHALATION_SPRAY | Freq: Every day | RESPIRATORY_TRACT | 12 refills | Status: DC

## 2017-08-23 NOTE — Telephone Encounter (Signed)
Will close this message since patient was seen by CY at 10am this morning.

## 2017-08-23 NOTE — Progress Notes (Signed)
HPI male former smoker followed for severe COPD/emphysema, vasomotor rhinitis PFT2/5/14- severe obstructive airways disease with slight response to bronchodilator. Air-trapping with increased residual volume. Diffusion mildly reduced. Emphysema pattern. FVC 2.56/59%, FEV1 1.17/41%, FEV1/FVC 0.45. Residual volume 132%, DLCO 78%. 6MWT-11/12/12- 96%, 94%, 96%, 454 m. Good distance with oxygenation well maintained. a1AT- WNL MM 142  6 minute walk test-10/20/15--saturation dropped to 89% on room air by end of walk, 412 m. Borderline but not qualifying for portable O2. Resolving acute bronchitis after doxycycline and Mucinex. Still frequent wheeze. Walk Test on room air 04/18/2016-qualified for portable oxygen with desaturation to 86% and appropriate recovery on 2 L oxygen. Denies cough with meals, eats slowly. --------------------------------------------------------------------------  06/11/17- 83 yo - male former smoker followed for COPD mixed type, vasomotor rhinitis, complicated by CVA/mild cognitive impairment, DM O2 2 L sleep, 3L awake /[facility]   requalified today FOLLOWS FOR: Pt continues to use O2 thorugh out the day as well as night. SOB at times still.  Vascular dementia has caused more behavioral issues as reviewed in neurology note. He uses his flutter device most days but always has some deep chest rattle-nonpurulent without fever or acute infection recently. Humid weather this summer has been bothersome. He moves slowly and is mostly at home. [employer] form completed. Wife asked about alternatives to [facility], which is being used twice daily. We discussed Brovana. They deny obvious choking swallowing problems with meals. CXR 03/26/17 Cardiac shadow is within normal limits. The lungs are mildly hyperinflated consistent with COPD. No focal infiltrate or sizable effusion is noted. No acute bony abnormality is seen. Mild stable interstitial changes are noted. IMPRESSION: No acute abnormality  noted.  No change from the prior exam  08/23/17- 37 yo - male former smoker followed for COPD mixed type, vasomotor rhinitis, complicated by CVA/mild cognitive impairment, DM ---breathing much worse this week. Very SOB walking to exam room. Has had congestion and is using the flutter valve O2 2-3 L/ [facility] Neb DuoNeb, Brovana,  Wife is here and indicates she needs to help him more because of his dementia.  He had frequent memory gaps.  PCP got CXR and suggested he see [place] because of increased shortness of breath over the past week.  No obvious acute event or infection, chest pain or palpitation.  Some cough, mostly dry. CXR 08/16/17- COPD with pulmonary hyperinflation. Lungs remain clear without infiltrate effusion or mass. Negative for heart failure. No change from the prior study. IMPRESSION: No active cardiopulmonary disease.  ROS-see HPI  + = positive Constitutional:   No-   weight loss, night sweats, fevers, chills, fatigue, lassitude. HEENT:   No-  headaches, difficulty swallowing, tooth/dental problems, sore throat,       No-  sneezing, itching, ear ache, nasal congestion, post nasal drip,  CV:  No-   chest pain, orthopnea, PND, swelling in lower extremities, anasarca,                                                                           dizziness, palpitations Resp: +shortness of breath with exertion or at rest.             +productive cough,  No non-productive cough,  No- coughing up of  blood.              No-   change in color of mucus.  + wheezing.   Skin: No-   rash or lesions. GI:  No-   heartburn, indigestion, abdominal pain, nausea, vomiting, GU: . MS:  No-   joint pain or swelling.  . Neuro-     nothing unusual Psych:  No- change in mood or affect. No depression or anxiety.  No memory loss.  OBJ- Physical Exam General- Alert, Oriented, Affect-appropriate, Distress- none acute, trim, POC O2 3 L Skin- rash-none, lesions- none, excoriation- none Lymphadenopathy-  none Head- atraumatic            Eyes- Gross vision intact, PERRLA, conjunctivae and secretions clear            Ears- Hearing, canals-normal            Nose- Clear, no-Septal dev, mucus, polyps, erosion, perforation             Throat- Mallampati II , mucosa clear , drainage- none, tonsils- atrophic Neck- flexible , trachea midline, no stridor , thyroid nl, carotid no bruit Chest - symmetrical excursion , unlabored           Heart/CV- RRR , no murmur , no gallop  , no rub, nl s1 s2                           - JVD- none , edema- none, stasis changes- none, varices- none           Lung-  +diminished, wheeze- none, cough + none while here, dullness-none, rub- none, + scattered rhonchi           Chest wall-  Abd-  Br/ Gen/ Rectal- Not done, not indicated Extrem- cyanosis- none, clubbing, none, atrophy- none, strength- nl Neuro- + nonfocal sitting quietly in exam room. Speech seems normal during simple conversation. Wife volunteers a lot of information.

## 2017-08-23 NOTE — Patient Instructions (Addendum)
Sample and script for Trelegy inhaler    Inhale 1 puff, then rinse mouth, once daily. If it seems to help breathing, you can get the script filled. Ok to continue nebulizer treatment and oxygen, and to use the Flutter if needed  Please call if we can help  We will keep the March appointment for now

## 2017-08-25 NOTE — Assessment & Plan Note (Signed)
Increased dyspnea on exertion and some increased dry cough with negative CXR for any acute process.  We will see how he responds to a maintenance inhaler by giving sample Trelegy do not think this is a cardiac issue but that needs to be watched.

## 2017-08-25 NOTE — Assessment & Plan Note (Signed)
He does not seem to remember changes in his status over the last week or 2 although he can describe symptoms.

## 2017-08-30 ENCOUNTER — Telehealth: Payer: Self-pay | Admitting: Internal Medicine

## 2017-08-30 NOTE — Telephone Encounter (Signed)
Spoke with Remo Lipps, she states she has a Quarry manager from [facility] and they are putting Garlon Hatchet on another tier and they recommend pt to change his medication to [place] starting in 2019. DId you want to change his medication? Please advise CY.   Current Outpatient Medications on File Prior to Visit  Medication Sig Dispense Refill  . arformoterol (BROVANA) 15 MCG/2ML NEBU Take 2 mLs (15 mcg total) by nebulization 2 (two) times daily. 120 mL 6  . aspirin EC 325 MG EC tablet Take 1 tablet (325 mg total) by mouth daily. 30 tablet 0  . atorvastatin (LIPITOR) 80 MG tablet Take 1 tablet (80 mg total) by mouth daily. 90 tablet 3  . Cholecalciferol (D3 MAXIMUM STRENGTH) 5000 UNITS capsule Take 5,000 Units by mouth daily.    . clopidogrel (PLAVIX) 75 MG tablet TAKE ONE TABLET BY MOUTH DAILY. 30 tablet 5  . divalproex (DEPAKOTE ER) 250 MG 24 hr tablet Take 250 mg by mouth daily.    [name] donepezil (ARICEPT) 10 MG tablet Take 1 tablet daily 90 tablet 3  . Fluticasone-Umeclidin-Vilant (TRELEGY ELLIPTA) 100-62.5-25 MCG/INH AEPB Inhale 1 puff daily into the lungs. Rinse mouth 1 each 12  . ipratropium-albuterol (DUONEB) 0.5-2.5 (3) MG/3ML SOLN INHALE CONTENTS OF 1 VIAL IN NEBULIZER EVERY 6 HOURS AS NEEDED. 90 mL 11  . nitroGLYCERIN (NITROSTAT) 0.4 MG SL tablet Place 0.4 mg under the tongue every 5 (five) minutes as needed for chest pain. Reported on 03/19/2016    . OXYGEN Inhale into the lungs. On 2 liters during the day and at night    . polyethylene glycol powder (GLYCOLAX/MIRALAX) powder TAKE 17 GRAMS (1 CAPFUL) IN 8 OZ OF FLUID ONCE DAILY 527 g 2  . Respiratory Therapy Supplies (FLUTTER) DEVI Blow through as directed 1 each 0  . tamsulosin (FLOMAX) 0.4 MG CAPS capsule TAKE ONE CAPSULE BY MOUTH DAILY. 90 capsule 0   No current facility-administered medications on file prior to visit.    Allergies  Allergen Reactions  . Tranexamic Acid Anaphylaxis    Had in 09/09/2016 in [facility] ED and had anaphylaxis reaction

## 2017-09-02 LAB — HM DIABETES EYE EXAM

## 2017-09-02 MED ORDER — FORMOTEROL FUMARATE 20 MCG/2ML IN NEBU: 20.0000 ug | INHALATION_SOLUTION | Freq: Two times a day (BID) | RESPIRATORY_TRACT | 6 refills | Status: DC

## 2017-09-02 NOTE — Telephone Encounter (Signed)
Spoke with Remo Lipps [facility]) about CY's recs. Advised her that I could go ahead and sent in RX to [facility] but advise them to hold it until January 2019. She verbalized understanding. Nothing else needed at time of call.

## 2017-09-02 NOTE — Telephone Encounter (Signed)
Ok to switch out to [employer] x 1 year

## 2017-09-04 ENCOUNTER — Encounter: Payer: Self-pay | Admitting: Nurse Practitioner

## 2017-09-10 ENCOUNTER — Other Ambulatory Visit: Payer: Self-pay | Admitting: *Deleted

## 2017-09-10 DIAGNOSIS — I639 Cerebral infarction, unspecified: Secondary | ICD-10-CM

## 2017-09-10 MED ORDER — CLOPIDOGREL BISULFATE 75 MG PO TABS: 75.0000 mg | ORAL_TABLET | Freq: Every day | ORAL | 1 refills | Status: DC

## 2017-10-17 ENCOUNTER — Other Ambulatory Visit: Payer: Self-pay | Admitting: Nurse Practitioner

## 2017-10-21 NOTE — Telephone Encounter (Signed)
[name] called and stated the patient is now out of the medicine. They loaned him 5 on Saturday. Please advise

## 2017-11-19 ENCOUNTER — Other Ambulatory Visit: Payer: Self-pay

## 2017-11-19 DIAGNOSIS — I639 Cerebral infarction, unspecified: Secondary | ICD-10-CM

## 2017-11-19 MED ORDER — ATORVASTATIN CALCIUM 80 MG PO TABS: 80.0000 mg | ORAL_TABLET | Freq: Every day | ORAL | 1 refills | Status: DC

## 2017-11-27 ENCOUNTER — Ambulatory Visit: Payer: Medicare Other | Admitting: Neurology

## 2017-11-27 ENCOUNTER — Encounter: Payer: Self-pay | Admitting: Neurology

## 2017-11-27 ENCOUNTER — Other Ambulatory Visit: Payer: Self-pay

## 2017-11-27 VITALS — BP 154/66 | HR 50 | Resp 16 | Ht 71.0 in | Wt 182.0 lb

## 2017-11-27 DIAGNOSIS — F0391 Unspecified dementia with behavioral disturbance: Secondary | ICD-10-CM | POA: Diagnosis not present

## 2017-11-27 DIAGNOSIS — F0150 Vascular dementia without behavioral disturbance: Secondary | ICD-10-CM | POA: Diagnosis not present

## 2017-11-27 DIAGNOSIS — R2681 Unsteadiness on feet: Secondary | ICD-10-CM | POA: Diagnosis not present

## 2017-11-27 DIAGNOSIS — F03B18 Unspecified dementia, moderate, with other behavioral disturbance: Secondary | ICD-10-CM

## 2017-11-27 MED ORDER — ESCITALOPRAM OXALATE 5 MG PO TABS: 5.0000 mg | ORAL_TABLET | Freq: Every day | ORAL | 11 refills | Status: DC

## 2017-11-27 MED ORDER — DONEPEZIL HCL 10 MG PO TABS: ORAL_TABLET | ORAL | 3 refills | Status: DC

## 2017-11-27 MED ORDER — DIVALPROEX SODIUM ER 250 MG PO TB24: 250.0000 mg | ORAL_TABLET | Freq: Every day | ORAL | 3 refills | Status: DC

## 2017-11-27 NOTE — Progress Notes (Signed)
NEUROLOGY FOLLOW UP OFFICE NOTE  Luis [name] [id number]  DOB: 08/22/1934  HISTORY OF PRESENT ILLNESS: I had the pleasure of seeing Luis [name] in follow-up in the neurology clinic on 11/27/2017.  The patient was last seen 6 months ago after a small stroke and mild dementia, likely vascular etiology. He is again accompanied by his wife and son who help supplement the history today. MMSE in August 2018 was 22/30 (25/30 in February 2018, 24/30 in October 2017, 27/30 in March 2017). He is tolerating Aricept 82m daily without side effects. He was also started on low dose Depakote 8252mqhs for mood stabilization, which he is tolerating without side effects. Memory continues to worsen, he does not remember conversations from 5 minutes ago. If they are going somewhere, he does not recall the next day where they were supposed to go. He did not remember this morning that they were coming today. He needs help with dressing and bathing. Getting him to shower "is a nightmare," he showers 1-2 times a week. His wife is having more difficulties taking care of him alone. She cannot leave him alone, her son comes on a daily basis. They deny any personality changes, but he is having more behavioral issues, he became slightly irritated at himself in the office today and said he was "disgusted" with himself. He is constantly apologizing to everyone "for everything" per son. Mood is usually calm. His appetite is not that good, but he will eat what is in front of him. He does not wander at night, but he gets up 3-4 times at night to use the bathroom, then he stays in bed until late morning and takes naps during the day. He is unsteady, no falls. His son is requesting for a walker with seat to help with getting around, and using a cane to get out of bed. No clear hallucinations. He denies any headaches, dizziness, diplopia, dysarthria, dysphagia, neck/back pain, focal numbness/tingling/weakness, bowel/bladder  dysfunction.  HPI 07/26/15: This is a very pleasant [age]o RH man with a history of hypertension, hyperlipidemia, diabetes, COPD, who presented for evaluation of worsening memory and hospital follow-up after recent stroke in October 2016. He feels his memory "could be better," he sometimes has to think when asked a question. His family reports memory changes started over the past year, he would forget names, conversations, ask the same questions. He would misplace things. His wife has recently started to pay attention to make sure he takes his medications, but he denies missing any doses. He continues to balance their checkbook and denies any missed bill payments. Prior to the stroke, he was driving and denied getting lost, but family has noticed his wife was doing more of the driving. He has occasional word-finding difficulties. He denies any difficulties with ADLs. On 07/15/15, they were at a picnic when family noticed that something was "not right," he had rambling speech and was not making sense, starting off a sentence then drifting off. There was no slurred speech or focal weakness noted. [facility] showed a small acute infarct in the left external capsule. I personally reviewed MRI brain, in addition there was moderate chronic microvascular disease. Carotid dopplers showed less than 50% stenosis bilaterally, mild plaque at the level of the carotid bulbs and ICA as well as the right common carotid artery. Echocardiogram showed EF 65-70%, normal left atrium. He had been taking low dose aspirin, and was switched to full dose 3282m  aspirin. Lipid panel showed total cholesterol 164, LDL 88. Simvastatin dose increased to 82m daily. HbA1c 6.8. He was also diagnosed with hyponatremia with sodium of 128 and hypoxia from COPD exacerbation. On hospital discharge, sodium level was 132. He was back to cognitive baseline. He denies any significant head injuries. No family history of  dementia. He has always had a poor sense of smell.   [facility] His wife reports that he had more confusion than baseline. He was watching the Super Bowl when she noticed he was rubbing his forehead and she asked if he had a headache. He stood up to go to the bathroom and could not find it. Since similar symptoms occurred with prior stroke, his [facility] EMS. [facility] which I personally reviewed, there was a punctate focus of restricted diffusion in the medial anterior right frontal lobe. There was diffuse atrophy and severe age-advanced chronic microvascular disease with confluent white matter changes seen. Stroke workup was unremarkable with echocardiogram showing EF 535-45% grade 1 diastolic dysfunction, normal left atrium. Carotid dopplers did not show any significant stenosis, estimated less than 50% bilaterally. He was evaluated by neurologist Dr. DMerlene Laughter it was doubtful that the tiny frontal infarct was the cause of his marked encephalopathy. His EEG showed mild diffuse slowing. Bloodwork for vasculitis showed normal ESR, CRP, homocysteine, RPR, ANA. Normal TSH and B12. He was discharged home on aspirin 8282mdaily and Plavix.   Diagnostic Data: Brain MRA did not show any branch occlusion or flow limiting stenosis to explain the recent small frontal infarct. There was subtle but convincing delayed opacification of the left vertebral artery, correlating with previous Doppler findings. This could be related to stenosis at the origin, but the lumen is intermittently and asymmetrically irregular, remote dissection is also considered. Left vertebral findings have an overall chronic appearance as noted above. The dominant right vertebral artery is widely patent to the basilar. Advanced chronic microvascular disease in the cerebral white matter. Bilateral PCA atheromatous narrowings, mild on the left and advanced on the right.  PAST MEDICAL  HISTORY: Past Medical History:  Diagnosis Date  . Arthritis   . COPD (chronic obstructive pulmonary disease) ([facility]  . Diabetes mellitus without complication ([facility]  . Dysphagia   . Emphysema   . Emphysema of lung ([facility]  . Exertional shortness of breath   . GERD (gastroesophageal reflux disease)   . Heart murmur    as a child  . High cholesterol   . Hypertension     MEDICATIONS: Current Outpatient Medications on File Prior to Visit  Medication Sig Dispense Refill  . arformoterol (BROVANA) 15 MCG/2ML NEBU Take 2 mLs (15 mcg total) by nebulization 2 (two) times daily. 120 mL 6  . aspirin EC 325 MG EC tablet Take 1 tablet (325 mg total) by mouth daily. 30 tablet 0  . atorvastatin (LIPITOR) 80 MG tablet Take 1 tablet (80 mg total) by mouth daily. 30 tablet 1  . Cholecalciferol (D3 MAXIMUM STRENGTH) 5000 UNITS capsule Take 5,000 Units by mouth daily.    . clopidogrel (PLAVIX) 75 MG tablet Take 1 tablet (75 mg total) by mouth daily. 90 tablet 1  . divalproex (DEPAKOTE ER) 250 MG 24 hr tablet Take 250 mg by mouth daily.    . Marland Kitchenonepezil (ARICEPT) 10 MG tablet Take 1 tablet daily 90 tablet 3  . Fluticasone-Umeclidin-Vilant (TRELEGY ELLIPTA) 100-62.5-25 MCG/INH AEPB Inhale 1 puff daily  into the lungs. Rinse mouth 1 each 12  . formoterol (PERFOROMIST) 20 MCG/2ML nebulizer solution Take 2 mLs (20 mcg total) by nebulization 2 (two) times daily. 120 mL 6  . ipratropium-albuterol (DUONEB) 0.5-2.5 (3) MG/3ML SOLN INHALE CONTENTS OF 1 VIAL IN NEBULIZER EVERY 6 HOURS AS NEEDED. 90 mL 11  . nitroGLYCERIN (NITROSTAT) 0.4 MG SL tablet Place 0.4 mg under the tongue every 5 (five) minutes as needed for chest pain. Reported on 03/19/2016    . OXYGEN Inhale into the lungs. On 2 liters during the day and at night    . polyethylene glycol powder (GLYCOLAX/MIRALAX) powder TAKE 17 GRAMS (1 CAPFUL) IN 8 OZ OF FLUID ONCE DAILY 527 g 2  . Respiratory Therapy Supplies (FLUTTER) DEVI Blow through as directed 1 each 0   . tamsulosin (FLOMAX) 0.4 MG CAPS capsule TAKE ONE CAPSULE BY MOUTH DAILY. 90 capsule 0   No current facility-administered medications on file prior to visit.     ALLERGIES: Allergies  Allergen Reactions  . Tranexamic Acid Anaphylaxis    Had in 09/09/2016 in [facility] ED and had anaphylaxis reaction    FAMILY HISTORY: Family History  Problem Relation Age of Onset  . Stroke Mother   . Healthy Father     SOCIAL HISTORY: Social History   Socioeconomic History  . Marital status: Married    Spouse name: Not on file  . Number of children: 3  . Years of education: 63  . Highest education level: Not on file  Social Needs  . Financial resource strain: Not on file  . Food insecurity - worry: Not on file  . Food insecurity - inability: Not on file  . Transportation needs - medical: Not on file  . Transportation needs - non-medical: Not on file  Occupational History  . Occupation: Retired  Tobacco Use  . Smoking status: Former Smoker    Packs/day: 2.00    Years: 35.00    Pack years: 70.00    Types: Cigarettes    Last attempt to quit: 10/08/1985    Years since quitting: 32.1  . Smokeless tobacco: Former Systems developer    Types: [place] date: 10/08/1997  Substance and Sexual Activity  . Alcohol use: No    Alcohol/week: 1.2 oz    Types: 2 Standard drinks or equivalent per week  . Drug use: No  . Sexual activity: Not Currently  Other Topics Concern  . Not on file  Social History Narrative   Fun: Golf   Denies abuse and feels safe at home.     REVIEW OF SYSTEMS: Constitutional: No fevers, chills, or sweats, no generalized fatigue, change in appetite Eyes: No visual changes, double vision, eye pain Ear, nose and throat: No hearing loss, ear pain, nasal congestion, sore throat Cardiovascular: No chest pain, palpitations Respiratory:  No shortness of breath at rest or with exertion, wheezes GastrointestinaI: No nausea, vomiting, diarrhea, abdominal pain, fecal  incontinence Genitourinary:  No dysuria, urinary retention or frequency Musculoskeletal:  No neck pain, back pain Integumentary: No rash, pruritus, skin lesions Neurological: as above Psychiatric: No depression, insomnia, anxiety Endocrine: No palpitations, fatigue, diaphoresis, mood swings, change in appetite, change in weight, increased thirst Hematologic/Lymphatic:  No anemia, purpura, petechiae. Allergic/Immunologic: no itchy/runny eyes, nasal congestion, recent allergic reactions, rashes  PHYSICAL EXAM: Vitals:   11/27/17 1005  BP: (!) 154/66  Pulse: (!) 50  Resp: 16   General: No acute distress, on O2 nasal cannula Head:  Normocephalic/atraumatic Neck: supple,  no paraspinal tenderness, full range of motion Heart:  Regular rate and rhythm Lungs:  Clear to auscultation bilaterally Back: No paraspinal tenderness Skin/Extremities: No rash, no edema Neurological Exam: alert and oriented to person, place, and month. No aphasia or dysarthria. Fund of knowledge is reduced.  Remote memory impaired.  Attention and concentration are reduced, 1/5 WORLD backwards. 0/3 delayed recall.  Able to name objects and repeat phrases. Cranial nerves: Pupils equal, round, reactive to light.  Extraocular movements intact with no nystagmus. Visual fields full. Facial sensation intact. No facial asymmetry. Tongue, uvula, palate midline.  Motor: Bulk and tone normal, muscle strength 5/5 throughout with no pronator drift.  Sensation to light touch intact.  No extinction to double simultaneous stimulation.  Deep tendon reflexes +2 throughout, toes downgoing.  Finger to nose testing intact.  Gait slow and cautious, unsteady. Romberg negative.  IMPRESSION: This is a pleasant 84 yo RH man with a history of hypertension, hyperlipidemia, diabetes, COPD, with worsening memory and strokes, most recently last February 2018. His MRI brain shows severe chronic microvascular disease. The stroke could be due to small vessel  disease, echo and carotid dopplers unremarkable. Vasculitis is also considered, bloodwork normal. Continue maximum medical management with aspirin and Plavix, lipid and BP control. He continues to have cognitive decline, now needing more help at home with basic ADLs. They will be referred to [facility]. He is noted to be more irritable today, we discussed adding on a low dose SSRI, he will start Lexapro 85m daily, side effects were discussed. Continue Aricept 168mdaily and Depakote 25042mhs. We again discussed control of vascular risk factors and daily aspirin, as well as physical exercise and brain stimulation exercises for brain health. We discussed home and fall safety, he was advised to start using a cane and walker with seat for balance. He will follow-up in 6 months and knows to call our office for any problems.   Thank you for allowing me to participate in his care.  Please do not hesitate to call for any questions or concerns.  The duration of this appointment visit was 25 minutes of face-to-face time with the patient.  Greater than 50% of this time was spent in counseling, explanation of diagnosis, planning of further management, and coordination of care.   KarEllouise Newer.D.   CC: AshCaesar ChestnutP

## 2017-11-27 NOTE — Patient Instructions (Addendum)
1. Start Lexapro 5mg  every morning 2. Refer to Home Health 3. Continue all your other medications 4. Start using walker with seat and cane for shorter distances to help with balance 5. Follow-up in 6 months, call for any changes  FALL PRECAUTIONS: Be cautious when walking. Scan the area for obstacles that may increase the risk of trips and falls. When getting up in the mornings, sit up at the edge of the bed for a few minutes before getting out of bed. Consider elevating the bed at the head end to avoid drop of blood pressure when getting up. Walk always in a well-lit room (use night lights in the walls). Avoid area rugs or power cords from appliances in the middle of the walkways. Use a walker or a cane if necessary and consider physical therapy for balance exercise. Get your eyesight checked regularly.  FINANCIAL OVERSIGHT: Supervision, especially oversight when making financial decisions or transactions is also recommended.  HOME SAFETY: Consider the safety of the kitchen when operating appliances like stoves, microwave oven, and blender. Consider having supervision and share cooking responsibilities until no longer able to participate in those. Accidents with firearms and other hazards in the house should be identified and addressed as well.  DRIVING: Regarding driving, in patients with progressive memory problems, driving will be impaired. We advise to have someone else do the driving if trouble finding directions or if minor accidents are reported. Independent driving assessment is available to determine safety of driving.  ABILITY TO BE LEFT ALONE: If patient is unable to contact 911 operator, consider using LifeLine, or when the need is there, arrange for someone to stay with patients. Smoking is a fire hazard, consider supervision or cessation. Risk of wandering should be assessed by caregiver and if detected at any point, supervision and safe proof recommendations should be  instituted.  MEDICATION SUPERVISION: Inability to self-administer medication needs to be constantly addressed. Implement a mechanism to ensure safe administration of the medications.  RECOMMENDATIONS FOR ALL PATIENTS WITH MEMORY PROBLEMS: 1. Continue to exercise (Recommend 30 minutes of walking everyday, or 3 hours every week) 2. Increase social interactions - continue going to [place] and enjoy social gatherings with friends and family 3. Eat healthy, avoid fried foods and eat more fruits and vegetables 4. Maintain adequate blood pressure, blood sugar, and blood cholesterol level. Reducing the risk of stroke and cardiovascular disease also helps promoting better memory. 5. Avoid stressful situations. Live a simple life and avoid aggravations. Organize your time and prepare for the next day in anticipation. 6. Sleep well, avoid any interruptions of sleep and avoid any distractions in the bedroom that may interfere with adequate sleep quality 7. Avoid sugar, avoid sweets as there is a strong link between excessive sugar intake, diabetes, and cognitive impairment We discussed the Mediterranean diet, which has been shown to help patients reduce the risk of progressive memory disorders and reduces cardiovascular risk. This includes eating fish, eat fruits and green leafy vegetables, nuts like almonds and hazelnuts, walnuts, and also use olive oil. Avoid fast foods and fried foods as much as possible. Avoid sweets and sugar as sugar use has been linked to worsening of memory function.  There is always a concern of gradual progression of memory problems. If this is the case, then we may need to adjust level of care according to patient needs. Support, both to the patient and caregiver, should then be put into place.

## 2017-12-09 ENCOUNTER — Ambulatory Visit: Payer: Medicare Other | Admitting: Internal Medicine

## 2017-12-09 ENCOUNTER — Encounter: Payer: Self-pay | Admitting: Internal Medicine

## 2017-12-09 VITALS — BP 124/68 | HR 42 | Ht 71.0 in | Wt 168.2 lb

## 2017-12-09 DIAGNOSIS — J9611 Chronic respiratory failure with hypoxia: Secondary | ICD-10-CM

## 2017-12-09 DIAGNOSIS — J449 Chronic obstructive pulmonary disease, unspecified: Secondary | ICD-10-CM | POA: Diagnosis not present

## 2017-12-09 DIAGNOSIS — R001 Bradycardia, unspecified: Secondary | ICD-10-CM

## 2017-12-09 NOTE — Patient Instructions (Signed)
I've taken off the Duoneb nebulizer solution, since you are using Perforomist.  I've taken off the Trelegy, since it didn't seem to help.  We can always restart these, or try something else later if we need to.  Please call if we can help.

## 2017-12-09 NOTE — Assessment & Plan Note (Signed)
There is a chronic bronchitis without obvious infection.  We have not demonstrated bronchiectasis.  I think right now, a pneumatic vest device for pulmonary toilet would be more disturbing than helpful.  Plan-we simplified medications for now.  Reconsider as needed.

## 2017-12-09 NOTE — Assessment & Plan Note (Signed)
He is comfortable with oxygen use, but needs a 24/7.  Wife is supportive and helpful.

## 2017-12-09 NOTE — Progress Notes (Signed)
HPI male former smoker followed for severe COPD/emphysema, vasomotor rhinitis PFT2/5/14- severe obstructive airways disease with slight response to bronchodilator. Air-trapping with increased residual volume. Diffusion mildly reduced. Emphysema pattern. FVC 2.56/59%, FEV1 1.17/41%, FEV1/FVC 0.45. Residual volume 132%, DLCO 78%. 6MWT-11/12/12- 96%, 94%, 96%, 454 m. Good distance with oxygenation well maintained. a1AT- WNL MM 142  6 minute walk test-10/20/15--saturation dropped to 89% on room air by end of walk, 412 m. Borderline but not qualifying for portable O2. Resolving acute bronchitis after doxycycline and Mucinex. Still frequent wheeze. Walk Test on room air 04/18/2016-qualified for portable oxygen with desaturation to 86% and appropriate recovery on 2 L oxygen. Denies cough with meals, eats slowly. --------------------------------------------------------------------------  08/23/17- 84 yo - male former smoker followed for COPD mixed type, vasomotor rhinitis, complicated by CVA/mild cognitive impairment, DM ---breathing much worse this week. Very SOB walking to exam room. Has had congestion and is using the flutter valve O2 2-3 L/ [facility] Neb DuoNeb, [place]vana,  Wife is here and indicates she needs to help him more because of his dementia.  He had frequent memory gaps.  PCP got CXR and suggested he see [place] because of increased shortness of breath over the past week.  No obvious acute event or infection, chest pain or palpitation.  Some cough, mostly dry. CXR 08/16/17- COPD with pulmonary hyperinflation. Lungs remain clear without infiltrate effusion or mass. Negative for heart failure. No change from the prior study. IMPRESSION: No active cardiopulmonary disease.  12/09/17- 76 yo - male former smoker followed for COPD mixed type, vasomotor rhinitis, complicated by CVA/mild cognitive impairment, DM O2 2-3 L/ [facility] ----COPD mixed type: Increased SOB with exertion, wheezing, full feeling in  chest(rattles).  Arrival HR 42/minute, BP 124/68 EKG 12/09/17- NSR 59, PACs, NSSTWC Trelegy  No help and not filled. Neb now = Perforomist, not Duoneb. Keeps her chest rattling cough even with regular use of flutter device which does help some, and nebulizer.  Nothing purulent, no blood, fever or chest pain. Continues oxygen 2-3 liters used 24/7.  ROS-see HPI  + = positive Constitutional:   No-   weight loss, night sweats, fevers, chills, fatigue, lassitude. HEENT:   No-  headaches, difficulty swallowing, tooth/dental problems, sore throat,       No-  sneezing, itching, ear ache, nasal congestion, post nasal drip,  CV:  No-   chest pain, orthopnea, PND, swelling in lower extremities, anasarca,                                                                           dizziness, palpitations Resp: +shortness of breath with exertion or at rest.             +productive cough,  No non-productive cough,  No- coughing up of blood.              No-   change in color of mucus.  + wheezing.   Skin: No-   rash or lesions. GI:  No-   heartburn, indigestion, abdominal pain, nausea, vomiting, GU: . MS:  No-   joint pain or swelling.  . Neuro-     nothing unusual Psych:  No- change in mood or affect. No depression or anxiety.  No  memory loss.  OBJ- Physical Exam General- Alert, Oriented, Affect-appropriate, Distress- none acute, trim, +POC O2 L Skin- rash-none, lesions- none, excoriation- none Lymphadenopathy- none Head- atraumatic            Eyes- Gross vision intact, PERRLA, conjunctivae and secretions clear            Ears- Hearing, canals-normal            Nose- Clear, no-Septal dev, mucus, polyps, erosion, perforation             Throat- Mallampati II , mucosa clear , drainage- none, tonsils- atrophic Neck- flexible , trachea midline, no stridor , thyroid nl, carotid no bruit Chest - symmetrical excursion , unlabored           Heart/CV- RRR , no murmur , no gallop  , no rub, nl s1 s2                            - JVD- none , edema- none, stasis changes- none, varices- none           Lung-  +diminished, wheeze- none, cough + rattle, dullness-none, rub- none,                   + scattered rhonchi           Chest wall-  Abd-  Br/ Gen/ Rectal- Not done, not indicated Extrem- cyanosis- none, clubbing, none, atrophy- none, strength- nl Neuro- + nonfocal sitting quietly in exam room. Speech seems normal during simple conversation. Wife volunteers a lot of information.

## 2017-12-18 ENCOUNTER — Other Ambulatory Visit: Payer: Self-pay | Admitting: Nurse Practitioner

## 2017-12-18 DIAGNOSIS — I639 Cerebral infarction, unspecified: Secondary | ICD-10-CM

## 2017-12-23 ENCOUNTER — Ambulatory Visit (INDEPENDENT_AMBULATORY_CARE_PROVIDER_SITE_OTHER): Payer: Medicare Other | Admitting: Ophthalmology

## 2018-01-09 ENCOUNTER — Emergency Department (HOSPITAL_COMMUNITY): Payer: Medicare Other

## 2018-01-09 ENCOUNTER — Encounter (HOSPITAL_COMMUNITY): Payer: Self-pay | Admitting: *Deleted

## 2018-01-09 ENCOUNTER — Telehealth: Payer: Self-pay | Admitting: Neurology

## 2018-01-09 ENCOUNTER — Emergency Department (HOSPITAL_COMMUNITY)
Admission: EM | Admit: 2018-01-09 | Discharge: 2018-01-09 | Disposition: A | Discharge status: Home / Self Care | Payer: Medicare Other | Attending: Emergency Medicine | Admitting: Emergency Medicine

## 2018-01-09 ENCOUNTER — Other Ambulatory Visit: Payer: Self-pay

## 2018-01-09 DIAGNOSIS — Z7982 Long term (current) use of aspirin: Secondary | ICD-10-CM | POA: Insufficient documentation

## 2018-01-09 DIAGNOSIS — R079 Chest pain, unspecified: Secondary | ICD-10-CM | POA: Insufficient documentation

## 2018-01-09 DIAGNOSIS — Z7901 Long term (current) use of anticoagulants: Secondary | ICD-10-CM | POA: Diagnosis not present

## 2018-01-09 DIAGNOSIS — Z87891 Personal history of nicotine dependence: Secondary | ICD-10-CM | POA: Insufficient documentation

## 2018-01-09 DIAGNOSIS — Z79899 Other long term (current) drug therapy: Secondary | ICD-10-CM | POA: Insufficient documentation

## 2018-01-09 DIAGNOSIS — F0390 Unspecified dementia without behavioral disturbance: Secondary | ICD-10-CM | POA: Insufficient documentation

## 2018-01-09 DIAGNOSIS — J449 Chronic obstructive pulmonary disease, unspecified: Secondary | ICD-10-CM | POA: Insufficient documentation

## 2018-01-09 DIAGNOSIS — Z8673 Personal history of transient ischemic attack (TIA), and cerebral infarction without residual deficits: Secondary | ICD-10-CM | POA: Diagnosis not present

## 2018-01-09 DIAGNOSIS — E119 Type 2 diabetes mellitus without complications: Secondary | ICD-10-CM | POA: Insufficient documentation

## 2018-01-09 DIAGNOSIS — R4182 Altered mental status, unspecified: Secondary | ICD-10-CM | POA: Insufficient documentation

## 2018-01-09 LAB — URINALYSIS, ROUTINE W REFLEX MICROSCOPIC
Bacteria, UA: NONE SEEN
Bilirubin Urine: NEGATIVE
Glucose, UA: NEGATIVE mg/dL
KETONES UR: NEGATIVE mg/dL
LEUKOCYTES UA: NEGATIVE
Nitrite: NEGATIVE
PROTEIN: NEGATIVE mg/dL
Specific Gravity, Urine: 1.031 — ABNORMAL HIGH (ref 1.005–1.030)
pH: 8 (ref 5.0–8.0)

## 2018-01-09 LAB — COMPREHENSIVE METABOLIC PANEL
ALBUMIN: 3.9 g/dL (ref 3.5–5.0)
ALT: 17 U/L (ref 17–63)
ANION GAP: 11 (ref 5–15)
AST: 23 U/L (ref 15–41)
Alkaline Phosphatase: 91 U/L (ref 38–126)
BILIRUBIN TOTAL: 1.3 mg/dL — AB (ref 0.3–1.2)
BUN: 13 mg/dL (ref 6–20)
CO2: 31 mmol/L (ref 22–32)
Calcium: 9.2 mg/dL (ref 8.9–10.3)
Chloride: 92 mmol/L — ABNORMAL LOW (ref 101–111)
Creatinine, Ser: 0.82 mg/dL (ref 0.61–1.24)
GFR calc Af Amer: >60 mL/min (ref >60)
GFR calc non Af Amer: >60 mL/min (ref >60)
GLUCOSE: 116 mg/dL — AB (ref 65–99)
POTASSIUM: 3.9 mmol/L (ref 3.5–5.1)
Sodium: 134 mmol/L — ABNORMAL LOW (ref 135–145)
TOTAL PROTEIN: 7 g/dL (ref 6.5–8.1)

## 2018-01-09 LAB — CBC WITH DIFFERENTIAL/PLATELET
BASOS ABS: 0 10*3/uL (ref 0.0–0.1)
Basophils Relative: 0 %
Eosinophils Absolute: 0 10*3/uL (ref 0.0–0.7)
Eosinophils Relative: 0 %
HCT: 42.7 % (ref 39.0–52.0)
Hemoglobin: 13.9 g/dL (ref 13.0–17.0)
LYMPHS PCT: 9 %
Lymphs Abs: 1 10*3/uL (ref 0.7–4.0)
MCH: 30.1 pg (ref 26.0–34.0)
MCHC: 32.6 g/dL (ref 30.0–36.0)
MCV: 92.4 fL (ref 78.0–100.0)
Monocytes Absolute: 0.6 10*3/uL (ref 0.1–1.0)
Monocytes Relative: 5 %
NEUTROS ABS: 9.9 10*3/uL — AB (ref 1.7–7.7)
Neutrophils Relative %: 86 %
Platelets: 222 10*3/uL (ref 150–400)
RBC: 4.62 MIL/uL (ref 4.22–5.81)
RDW: 13.7 % (ref 11.5–15.5)
WBC: 11.5 10*3/uL — ABNORMAL HIGH (ref 4.0–10.5)

## 2018-01-09 LAB — CBG MONITORING, ED: Glucose-Capillary: 112 mg/dL — ABNORMAL HIGH (ref 65–99)

## 2018-01-09 MED ORDER — SODIUM CHLORIDE 0.9 % IV SOLN
INTRAVENOUS | Status: DC
  Administered 2018-01-09: 14:00:00 via INTRAVENOUS

## 2018-01-09 MED ORDER — SODIUM CHLORIDE 0.9 % IV BOLUS
500.0000 mL | Freq: Once | INTRAVENOUS | Status: AC
  Administered 2018-01-09: 500 mL via INTRAVENOUS

## 2018-01-09 MED ORDER — IOPAMIDOL (ISOVUE-300) INJECTION 61%
75.0000 mL | Freq: Once | INTRAVENOUS | Status: AC | PRN
  Administered 2018-01-09: 75 mL via INTRAVENOUS

## 2018-01-09 NOTE — Discharge Instructions (Addendum)
Workup to include MRI brain and urinalysis and chest x-ray without any acute findings no explanation for the mental status change.  Follow-up with his doctor will be important return for any new or worse symptoms.

## 2018-01-09 NOTE — ED Notes (Signed)
EDP in room talking with family at this time.

## 2018-01-09 NOTE — Telephone Encounter (Signed)
Spoke with pt's son, Herbie Baltimore.  He states that he received a call from pt's wife saying that pt went to bed fully clothed, including his hoodie, last night. This morning pt was in the living room saying that he could not find the bedroom.  Son states that this is very unusual even since pt's diagnosis.  I advised Herbie Baltimore to take pt to the Urgent Care as they will be able to preform any labs that need to be done.  Son agreed.   Dr. Delice Lesch, just an fyi

## 2018-01-09 NOTE — ED Notes (Signed)
Case management reached via phone at this time and will set up home health face to face with [facility].

## 2018-01-09 NOTE — Telephone Encounter (Signed)
Patient's son called and said that his dad is showing signs of confusion. He went to bed with his clothes on then woke up in the middle of the night not sure where he was. Please Call. Thanks

## 2018-01-09 NOTE — ED Notes (Signed)
Gave EKG to Dr Zackowski. 

## 2018-01-09 NOTE — ED Provider Notes (Addendum)
[facility] EMERGENCY DEPARTMENT Provider Note   CSN: [id number] Arrival date & time: 01/09/18  1118     History   Chief Complaint Chief Complaint  Patient presents with  . Altered Mental Status    HPI Luis [name] is a 84 y.o. male.  Patient with a known history of dementia.  But with a sudden change last night where he went to bed with his clothes on.  And that continued through today.  This seemed to occur around 2200 last night.  Patient's had a history of TIAs in the past.  Patient woke up at 5:00 this morning and was walking around the house confused and trying to figure out how to get back to the bedroom.  Patient's past medical history also significant for severe COPD.  Normally on 2 L of oxygen at all times.     Past Medical History:  Diagnosis Date  . Arthritis   . COPD (chronic obstructive pulmonary disease) ([place])   . Dysphagia   . Emphysema   . Emphysema of lung ([place])   . Exertional shortness of breath   . GERD (gastroesophageal reflux disease)   . Heart murmur    as a child  . High cholesterol   . Hx of diabetes mellitus   . Hypertension     Patient Active Problem List   Diagnosis Date Noted  . Medicare annual wellness visit, subsequent 12/19/2016  . Encounter for general adult medical examination with abnormal findings 12/19/2016  . Vascular dementia without behavioral disturbance 11/21/2016  . TIA (transient ischemic attack) 11/12/2016  . Hypokalemia 08/01/2016  . Cough 07/13/2016  . General weakness 07/13/2016  . History of stroke 12/29/2015  . GI bleed 12/19/2015  . Mild cognitive impairment 07/26/2015  . Hyperlipidemia 07/26/2015  . Acute encephalopathy   . Altered mental status 07/15/2015  . COPD exacerbation ([place]) 07/15/2015  . Hyponatremia 07/15/2015  . Essential hypertension 07/15/2015  . CVA (cerebral vascular accident) ([place]) 07/15/2015  . Chronic respiratory failure with hypoxia ([place]) 07/15/2015  . Diabetes mellitus ([place])  07/15/2015  . Bilateral inguinal hernia 03/04/2013  . Vasomotor rhinitis 11/29/2012  . COPD mixed type ([place]) 11/08/2012    Past Surgical History:  Procedure Laterality Date  . CATARACT EXTRACTION W/ INTRAOCULAR LENS IMPLANT Left 2011  . COLONOSCOPY N/A 01/03/2016   Procedure: COLONOSCOPY;  Surgeon: Aviva Signs, MD;  Location[facility];  Service: Gastroenterology;  Laterality: N/A;  . ESOPHAGEAL DILATION  2000's   x2  . EYE SURGERY    . INGUINAL HERNIA REPAIR Right 1970's  . INGUINAL HERNIA REPAIR Bilateral 03/12/2013   w/mesh bilaterally/notes 03/12/2013  . INGUINAL HERNIA REPAIR Bilateral 03/12/2013   Procedure: HERNIA REPAIR INGUINAL ADULT BILATERAL;  Surgeon: Merrie Roof, MD;  Location: [place];  Service: General;  Laterality: Bilateral;  . INSERTION OF MESH Bilateral 03/12/2013   Procedure: INSERTION OF MESH;  Surgeon: Merrie Roof, MD;  Location: [place];  Service: General;  Laterality: Bilateral;  . RETINAL DETACHMENT SURGERY Right 08/29/2010  . TONSILLECTOMY  1940's  . TRANSURETHRAL RESECTION OF PROSTATE  2002; 2013        Home Medications    Prior to Admission medications   Medication Sig Start Date End Date Taking? Authorizing Provider  aspirin EC 325 MG EC tablet Take 1 tablet (325 mg total) by mouth daily. 11/14/16  Yes Nita Sells, MD  atorvastatin (LIPITOR) 80 MG tablet TAKE ONE TABLET BY MOUTH DAILY. 12/19/17  Yes Shambley,  Delphia Grates, NP  Cholecalciferol (D3 MAXIMUM STRENGTH) 5000 UNITS capsule Take 5,000 Units by mouth daily.   Yes [provider]  clopidogrel (PLAVIX) 75 MG tablet Take 1 tablet (75 mg total) by mouth daily. 09/10/17  Yes Lance Sell, NP  divalproex (DEPAKOTE ER) 250 MG 24 hr tablet Take 1 tablet (250 mg total) by mouth daily. 11/27/17  Yes Cameron Sprang, MD  donepezil (ARICEPT) 10 MG tablet Take 1 tablet daily 11/27/17  Yes Cameron Sprang, MD  escitalopram (LEXAPRO) 5 MG tablet Take 1 tablet (5 mg total) by mouth daily.  11/27/17  Yes Cameron Sprang, MD  formoterol (PERFOROMIST) 20 MCG/2ML nebulizer solution Take 2 mLs (20 mcg total) by nebulization 2 (two) times daily. 09/02/17  Yes Young, Tarri Fuller D, MD  nitroGLYCERIN (NITROSTAT) 0.4 MG SL tablet Place 0.4 mg under the tongue every 5 (five) minutes as needed for chest pain. Reported on 03/19/2016   Yes [provider]  OXYGEN Inhale into the lungs. On 2 liters during the day and at night   Yes [provider]  polyethylene glycol powder (GLYCOLAX/MIRALAX) powder TAKE 17 GRAMS (1 CAPFUL) IN 8 OZ OF FLUID ONCE DAILY 03/21/17  Yes Golden Circle, FNP  Respiratory Therapy Supplies (FLUTTER) DEVI Blow through as directed 12/06/16  Yes Young, Tarri Fuller D, MD  tamsulosin (FLOMAX) 0.4 MG CAPS capsule TAKE ONE CAPSULE BY MOUTH DAILY. 10/21/17  Yes Lance Sell, NP    Family History Family History  Problem Relation Age of Onset  . Stroke Mother   . Healthy Father     Social History Social History   Tobacco Use  . Smoking status: Former Smoker    Packs/day: 2.00    Years: 35.00    Pack years: 70.00    Types: Cigarettes    Last attempt to quit: 10/08/1985    Years since quitting: 32.2  . Smokeless tobacco: Former Systems developer    Types: [place] date: 10/08/1997  Substance Use Topics  . Alcohol use: No    Alcohol/week: 1.2 oz    Types: 2 Standard drinks or equivalent per week  . Drug use: No     Allergies   Tranexamic acid   Review of Systems Review of Systems  Unable to perform ROS: Dementia     Physical Exam Updated Vital Signs BP 139/68   Pulse (!) 50   Temp 98.5 F (36.9 C) (Oral)   Resp 15   Ht 1.778 m (5\' 10" )   Wt 76.2 kg (168 lb)   SpO2 99%   BMI 24.11 kg/m   Physical Exam  Constitutional: He appears well-developed and well-nourished. No distress.  HENT:  Head: Normocephalic and atraumatic.  Mouth/Throat: Oropharynx is clear and moist.  Eyes: Pupils are equal, round, and reactive to light. Conjunctivae and  EOM are normal.  Neck: Normal range of motion. Neck supple.  Cardiovascular: Normal rate, regular rhythm and normal heart sounds.  Pulmonary/Chest: Effort normal and breath sounds normal. No respiratory distress.  Abdominal: Soft. Bowel sounds are normal. There is no tenderness.  Musculoskeletal: Normal range of motion. He exhibits no edema.  Neurological: He is alert. No cranial nerve deficit or sensory deficit. He exhibits normal muscle tone. Coordination normal.  Skin: Skin is warm.  Nursing note and vitals reviewed.    ED Treatments / Results  Labs (all labs ordered are listed, but only abnormal results are displayed) Labs Reviewed  COMPREHENSIVE METABOLIC PANEL - Abnormal; Notable  for the following components:      Result Value   Sodium 134 (*)    Chloride 92 (*)    Glucose, Bld 116 (*)    Total Bilirubin 1.3 (*)    All other components within normal limits  CBC WITH DIFFERENTIAL/PLATELET - Abnormal; Notable for the following components:   WBC 11.5 (*)    Neutro Abs 9.9 (*)    All other components within normal limits  CBG MONITORING, ED - Abnormal; Notable for the following components:   Glucose-Capillary 112 (*)    All other components within normal limits  URINALYSIS, ROUTINE W REFLEX MICROSCOPIC  CBG MONITORING, ED    EKG EKG Interpretation  Date/Time:  Thursday January 09 2018 11:35:21 EDT Ventricular Rate:  77 PR Interval:    QRS Duration: 93 QT Interval:  407 QTC Calculation: 420 R Axis:   55 Text Interpretation:  Sinus rhythm Atrial premature complexes Right atrial enlargement Borderline T wave abnormalities Confirmed by Fredia Sorrow [phone]) on 01/09/2018 12:11:51 PM   Radiology Dg Chest 2 View  Result Date: 01/09/2018 CLINICAL DATA:  COPD, altered mental status, low oxygenation. EXAM: CHEST - 2 VIEW COMPARISON:  08/16/2017. FINDINGS: Normal heart size. Severe hyperinflation consistent with COPD. No consolidation or edema. No effusion or pneumothorax.  Bones unremarkable. IMPRESSION: COPD, no active disease, similar appearance to priors. Electronically Signed   By: Staci Righter M.D.   On: 01/09/2018 12:32   Ct Head Wo Contrast  Result Date: 01/09/2018 CLINICAL DATA:  LEFT anterior chest pain and confusion this morning, diabetes mellitus, essential hypertension, COPD, prior stroke EXAM: CT HEAD WITHOUT CONTRAST TECHNIQUE: Contiguous axial images were obtained from the base of the skull through the vertex without intravenous contrast. Sagittal and coronal MPR images reconstructed from axial data set. COMPARISON:  11/13/2016 FINDINGS: Brain: Generalized atrophy. Normal ventricular morphology. No midline shift or mass effect. Small vessel chronic ischemic changes of deep cerebral white matter. No intracranial hemorrhage, mass lesion, evidence of acute infarction, or extra-axial fluid collection. Vascular: Mild atherosclerotic calcification of internal carotid arteries at skull base Skull: Intact Sinuses/Orbits: Clear Other: N/A IMPRESSION: Atrophy with small vessel chronic ischemic changes of deep cerebral white matter. No acute intracranial abnormalities. Electronically Signed   By: Lavonia Dana M.D.   On: 01/09/2018 14:24   Ct Chest W Contrast  Result Date: 01/09/2018 CLINICAL DATA:  84 year old male with left anterior chest pain, confusion, shortness of breath. EXAM: CT CHEST WITH CONTRAST TECHNIQUE: Multidetector CT imaging of the chest was performed during intravenous contrast administration. CONTRAST:  16mL ISOVUE-300 IOPAMIDOL (ISOVUE-300) INJECTION 61% COMPARISON:  Chest radiographs 1203 hours today, and earlier. Chest CT [facility] 07/03/2012 FINDINGS: Cardiovascular: Calcified coronary artery atherosclerosis. Soft and calcified aortic atherosclerosis. Mild-to-moderate proximal great vessel atherosclerosis. No thoracic aortic dissection or aneurysm. No cardiomegaly.  No pericardial effusion. Mediastinum/Nodes: No mediastinal  lymphadenopathy. Chronic left thyroid goiter appears not significantly changed since 2013. Lungs/Pleura: Chronic large lung volumes. Centrilobular emphysema with a lower lobe predominance. Stable mild curvilinear scarring in the right lung apex since 2013 (series 4, image 23). A tiny left lower lobe medial basal segment lung nodule on series 4, image 115 is also stable and benign. Mild chronic scarring in the posterior right costophrenic angle is stable and benign. There is mild generalized peribronchial thickening which is new or increased since 2013. The central airways are patent. There is opacification of some right lower lobe airways (series 4, image 112) which appears related to secretions. There is  no pleural effusion or other abnormal pulmonary opacity. Upper Abdomen: Chronic benign right renal upper pole cyst. Negative visible liver, spleen, pancreas, adrenal glands, left kidney, and bowel in the upper abdomen. Chronic cholelithiasis (series 2, image 161), the gallbladder is incompletely visible. Musculoskeletal: Osteopenia. Stable thoracic vertebral height and alignment since 2013. Occasional chronic thoracic spine degeneration also appears stable, such as at T7-T8. No acute osseous abnormality identified. IMPRESSION: 1. Emphysema (ICD10-J43.9). Generalized inflammatory or infectious peribronchial thickening appears increased since 2013. There is mild opacification of some segmental bronchi in the right lower lobe related to retained secretions, but no pneumonia or acute parenchymal lung inflammation identified. 2. Aortic Atherosclerosis (ICD10-I70.0). Coronary artery atherosclerosis. 3. Chronic cholelithiasis. Electronically Signed   By: Genevie Ann M.D.   On: 01/09/2018 14:29    Procedures Procedures (including critical care time)  Medications Ordered in ED Medications  0.9 %  sodium chloride infusion ( Intravenous New Bag/Given 01/09/18 1421)  sodium chloride 0.9 % bolus 500 mL (0 mLs Intravenous  Stopped 01/09/18 1452)  iopamidol (ISOVUE-300) 61 % injection 75 mL (75 mLs Intravenous Contrast Given 01/09/18 1405)     Initial Impression / Assessment and Plan / ED Course  I have reviewed the triage vital signs and the nursing notes.  Pertinent labs & imaging results that were available during my care of the patient were reviewed by me and considered in my medical decision making (see chart for details).     Workup here today head CT without acute findings.  Chest x-ray negative for any signs of pneumonia.  Labs other than a mild leukocytosis without any significant abnormalities.  EKG without acute cardiac changes.  Based on the patient's past history of TIAs MRI brain ordered.  Also urinalysis has been ordered and is still pending.  In and out cath requested.  Suspect infection perhaps somewhere.  Patient oxygen sats on his 2 L her in the upper 90s.  Also possible there could have been just a worsening of his dementia.  If MRI brain negative and urinalysis negative for urinary tract infection patient can be discharged home.    Final Clinical Impressions(s) / ED Diagnoses   Final diagnoses:  Altered mental status, unspecified altered mental status type    ED Discharge Orders    None       Fredia Sorrow, MD 01/09/18 1517   Addendum: MRI brain negative for any acute findings.  Cath urinalysis still pending.    Fredia Sorrow, MD 01/09/18 1527  MRI brain urinalysis negative for any acute abnormalities.  Some evidence on the urinalysis of some dehydration with urine being concentrated.  Basic labs without any significant findings other than a mild leukocytosis.  Oxygen saturations on his normal 2 L have been in the upper 90s.  Patient stable for discharge home.  No acute explanation for the altered mental status changes.  May be an change in his dementia pattern.    Fredia Sorrow, MD 01/09/18 1550   Family with some concerns of how they can take care of patient  at home.  Will have social worker come and talk to them about possible resources that can be brought into the home.  Based on today's extensive workup no indication for admission.  Urinalysis did show some concentration but he said IV fluids here throughout the day.  Patient was fine or at his baseline until 10:00 last night.  So no track record him not being able to eat or drink well.  Patient is also  followed by [facility] neurology.  There may be some options to have some adjustments on his medications which may help improve the dementia state.   Fredia Sorrow, MD 01/09/18 [phone]

## 2018-01-09 NOTE — ED Triage Notes (Signed)
Pt's family reports they found him laying down in bed last night with his clothes on which is unusual. Wife reports LKW at 37. Wife reports pt woke up at 0500 and pt was walking around the house confused trying to figure out how to get back to the bedroom. Pt reports he doesn't remember feeling confused this morning or last night.

## 2018-01-09 NOTE — ED Notes (Signed)
Pt transported to MRI 

## 2018-01-10 NOTE — Care Management (Signed)
CM received consult for [facility] referral. Pt's family requests [facility]. CM has contacted [employer] rep, and given referral. He will pull pt info from chart.

## 2018-01-14 ENCOUNTER — Other Ambulatory Visit: Payer: Self-pay | Admitting: Nurse Practitioner

## 2018-01-20 ENCOUNTER — Encounter (INDEPENDENT_AMBULATORY_CARE_PROVIDER_SITE_OTHER): Payer: Medicare Other | Admitting: Ophthalmology

## 2018-01-20 DIAGNOSIS — I10 Essential (primary) hypertension: Secondary | ICD-10-CM | POA: Diagnosis not present

## 2018-01-20 DIAGNOSIS — E11319 Type 2 diabetes mellitus with unspecified diabetic retinopathy without macular edema: Secondary | ICD-10-CM | POA: Diagnosis not present

## 2018-01-20 DIAGNOSIS — E113293 Type 2 diabetes mellitus with mild nonproliferative diabetic retinopathy without macular edema, bilateral: Secondary | ICD-10-CM

## 2018-01-20 DIAGNOSIS — H33302 Unspecified retinal break, left eye: Secondary | ICD-10-CM

## 2018-01-20 DIAGNOSIS — H35033 Hypertensive retinopathy, bilateral: Secondary | ICD-10-CM | POA: Diagnosis not present

## 2018-01-20 DIAGNOSIS — H43812 Vitreous degeneration, left eye: Secondary | ICD-10-CM | POA: Diagnosis not present

## 2018-01-31 ENCOUNTER — Telehealth: Payer: Self-pay | Admitting: Nurse Practitioner

## 2018-01-31 NOTE — Telephone Encounter (Signed)
Received following paper notification from pharmacy : "donepezil oral interacts with escitalopram oxalate oral-action is required to reduce the risk of severe adverse interaction." These medications are prescribed by patients neurologist Dr Marylen Ponto will forward this paper notification to Dr Delice Lesch

## 2018-01-31 NOTE — Telephone Encounter (Signed)
Paper has been faxed over to Dr. Delice Lesch.

## 2018-02-05 DIAGNOSIS — Z7902 Long term (current) use of antithrombotics/antiplatelets: Secondary | ICD-10-CM | POA: Diagnosis not present

## 2018-02-05 DIAGNOSIS — Z9981 Dependence on supplemental oxygen: Secondary | ICD-10-CM | POA: Diagnosis not present

## 2018-02-05 DIAGNOSIS — Z87891 Personal history of nicotine dependence: Secondary | ICD-10-CM | POA: Diagnosis not present

## 2018-02-05 DIAGNOSIS — J9610 Chronic respiratory failure, unspecified whether with hypoxia or hypercapnia: Secondary | ICD-10-CM | POA: Diagnosis not present

## 2018-02-05 DIAGNOSIS — Z8673 Personal history of transient ischemic attack (TIA), and cerebral infarction without residual deficits: Secondary | ICD-10-CM | POA: Diagnosis not present

## 2018-02-05 DIAGNOSIS — E119 Type 2 diabetes mellitus without complications: Secondary | ICD-10-CM | POA: Diagnosis not present

## 2018-02-05 DIAGNOSIS — F0390 Unspecified dementia without behavioral disturbance: Secondary | ICD-10-CM | POA: Diagnosis not present

## 2018-02-05 DIAGNOSIS — I10 Essential (primary) hypertension: Secondary | ICD-10-CM | POA: Diagnosis not present

## 2018-02-05 DIAGNOSIS — R41841 Cognitive communication deficit: Secondary | ICD-10-CM | POA: Diagnosis not present

## 2018-02-05 DIAGNOSIS — M6281 Muscle weakness (generalized): Secondary | ICD-10-CM | POA: Diagnosis not present

## 2018-02-05 DIAGNOSIS — R2689 Other abnormalities of gait and mobility: Secondary | ICD-10-CM | POA: Diagnosis not present

## 2018-02-05 DIAGNOSIS — J449 Chronic obstructive pulmonary disease, unspecified: Secondary | ICD-10-CM | POA: Diagnosis not present

## 2018-02-14 ENCOUNTER — Telehealth: Payer: Self-pay | Admitting: Nurse Practitioner

## 2018-02-14 MED ORDER — TAMSULOSIN HCL 0.4 MG PO CAPS: 0.4000 mg | ORAL_CAPSULE | Freq: Every day | ORAL | 0 refills | Status: DC

## 2018-02-14 NOTE — Addendum Note (Signed)
Addended by: Earnstine Regal on: 02/14/2018 01:34 PM   Modules accepted: Orders

## 2018-02-14 NOTE — Telephone Encounter (Signed)
Copied from [place] [id number]. Topic: Quick Communication - Rx Refill/Question >> Feb 14, 2018  1:10 PM Boyd Kerbs wrote: Medication: tamsulosin (FLOMAX) 0.4 MG CAPS capsule  Has the patient contacted their pharmacy? Yes.   (Agent: If no, request that the patient contact the pharmacy for the refill.) Preferred Pharmacy (with phone number or street name):   [facility] - [place], [address], [place] [address] Phone: [phone] Fax: [phone]   Agent: Please be advised that RX refills may take up to 3 business days. We ask that you follow-up with your pharmacy. >> Feb 14, 2018  1:14 PM Boyd Kerbs wrote: She has hard time getting him ready  And would need at 11:30 or later appt. If can get worked in.

## 2018-02-14 NOTE — Telephone Encounter (Signed)
Pt. Wife called, saying he has so many dr. appt and hard to get him in but did set up appt for 6/18 that is the next appt he could get in.  Asking for refill until appt.   [facility] in [place].

## 2018-02-14 NOTE — Telephone Encounter (Signed)
He only has 2 pills left

## 2018-02-14 NOTE — Telephone Encounter (Signed)
Per office policy sent 30 day to local pharmacy until appt.../lmb  

## 2018-02-14 NOTE — Telephone Encounter (Signed)
See refill request already sent to [facility].Marland KitchenJohny [name]

## 2018-02-17 ENCOUNTER — Other Ambulatory Visit: Payer: Self-pay | Admitting: Nurse Practitioner

## 2018-02-17 ENCOUNTER — Other Ambulatory Visit: Payer: Self-pay

## 2018-02-17 DIAGNOSIS — I639 Cerebral infarction, unspecified: Secondary | ICD-10-CM

## 2018-02-17 MED ORDER — TAMSULOSIN HCL 0.4 MG PO CAPS: 0.4000 mg | ORAL_CAPSULE | Freq: Every day | ORAL | 0 refills | Status: DC

## 2018-02-18 ENCOUNTER — Encounter: Payer: Self-pay | Admitting: Family

## 2018-02-18 ENCOUNTER — Telehealth: Payer: Self-pay | Admitting: Family

## 2018-02-18 ENCOUNTER — Ambulatory Visit: Payer: Medicare Other | Admitting: Family

## 2018-02-18 ENCOUNTER — Ambulatory Visit: Payer: Self-pay

## 2018-02-18 VITALS — BP 160/78 | HR 51 | Temp 98.8°F | Ht 70.0 in | Wt 166.1 lb

## 2018-02-18 DIAGNOSIS — I10 Essential (primary) hypertension: Secondary | ICD-10-CM

## 2018-02-18 DIAGNOSIS — R001 Bradycardia, unspecified: Secondary | ICD-10-CM

## 2018-02-18 MED ORDER — AMLODIPINE BESYLATE 2.5 MG PO TABS: 2.5000 mg | ORAL_TABLET | Freq: Every day | ORAL | 0 refills | Status: DC

## 2018-02-18 NOTE — Telephone Encounter (Signed)
Received phone call form Leana Gamer from [facility] regarding pt's BP. She is with pt and was taking vital signs at the beginning of her visit. Manual BP: 180/75, 190/89 and automatic BP's: 199/89 210/89. Pt is not having any cardiac or neurological sx. Pt is not on any antihypertensive medications.   Disposition was UCC or PCP triage. Called PCP office and spoke with [facility] regarding pt. Advised that pt can be seen ay office at 4:00 pm with Jodi Mourning NP. Pt is agreeable to appointment.   Reason for Disposition . [1] Systolic BP  >= 245 OR Diastolic >= 809  AND [9] having NO cardiac or neurologic symptoms  Answer Assessment - Initial Assessment Questions 1. BLOOD PRESSURE: "What is the blood pressure?" "Did you take at least two measurements 5 minutes apart?"     180/75 Manual BP and automatic 199/89  Retaken 210/89 2. ONSET: "When did you take your blood pressure?"    Manual BP, automatic BP 3. HOW: "How did you obtain the blood pressure?" (e.g., visiting nurse, automatic home BP monitor)     PT and automatic BP monitor 4. HISTORY: "Do you have a history of high blood pressure?"     no 5. MEDICATIONS: "Are you taking any medications for blood pressure?" "Have you missed any doses recently?"     no 6. OTHER SYMPTOMS: "Do you have any symptoms?" (e.g., headache, chest pain, blurred vision, difficulty breathing, weakness)     no 7. PREGNANCY: "Is there any chance you are pregnant?" "When was your last menstrual period?"     n/a  Protocols used: HIGH BLOOD PRESSURE-A-AH

## 2018-02-18 NOTE — Telephone Encounter (Signed)
Please let him and his wife know that I sat down and looked over his vitals for the past year; I am concerned as to why his pulse rate is staying so low. He is not on any medication that should be causing this. I would like him to see a cardiologist to make sure not missing an underling issue with his heart that could explain the sudden elevation in his blood pressure recently. Referral has been done.

## 2018-02-18 NOTE — Progress Notes (Signed)
Luis [name] is a 84 y.o. male with the following history as recorded in EpicCare:  Patient Active Problem List   Diagnosis Date Noted  . Medicare annual wellness visit, subsequent 12/19/2016  . Encounter for general adult medical examination with abnormal findings 12/19/2016  . Vascular dementia without behavioral disturbance 11/21/2016  . TIA (transient ischemic attack) 11/12/2016  . Hypokalemia 08/01/2016  . Cough 07/13/2016  . General weakness 07/13/2016  . History of stroke 12/29/2015  . GI bleed 12/19/2015  . Mild cognitive impairment 07/26/2015  . Hyperlipidemia 07/26/2015  . Acute encephalopathy   . Altered mental status 07/15/2015  . COPD exacerbation ([place]) 07/15/2015  . Hyponatremia 07/15/2015  . Essential hypertension 07/15/2015  . CVA (cerebral vascular accident) ([place]) 07/15/2015  . Chronic respiratory failure with hypoxia ([place]) 07/15/2015  . Diabetes mellitus ([place]) 07/15/2015  . Bilateral inguinal hernia 03/04/2013  . Vasomotor rhinitis 11/29/2012  . COPD mixed type ([place]) 11/08/2012    Current Outpatient Medications  Medication Sig Dispense Refill  . amLODipine (NORVASC) 2.5 MG tablet Take 1 tablet (2.5 mg total) by mouth daily. 90 tablet 0  . aspirin EC 325 MG EC tablet Take 1 tablet (325 mg total) by mouth daily. 30 tablet 0  . atorvastatin (LIPITOR) 80 MG tablet TAKE ONE TABLET BY MOUTH DAILY. 30 tablet 1  . Cholecalciferol (D3 MAXIMUM STRENGTH) 5000 UNITS capsule Take 5,000 Units by mouth daily.    . clopidogrel (PLAVIX) 75 MG tablet Take 1 tablet (75 mg total) by mouth daily. 90 tablet 1  . divalproex (DEPAKOTE ER) 250 MG 24 hr tablet Take 1 tablet (250 mg total) by mouth daily. 90 tablet 3  . donepezil (ARICEPT) 10 MG tablet Take 1 tablet daily 90 tablet 3  . escitalopram (LEXAPRO) 5 MG tablet Take 1 tablet (5 mg total) by mouth daily. 30 tablet 11  . formoterol (PERFOROMIST) 20 MCG/2ML nebulizer solution Take 2 mLs (20 mcg total) by nebulization 2 (two)  times daily. 120 mL 6  . nitroGLYCERIN (NITROSTAT) 0.4 MG SL tablet Place 0.4 mg under the tongue every 5 (five) minutes as needed for chest pain. Reported on 03/19/2016    . OXYGEN Inhale into the lungs. On 2 liters during the day and at night    . polyethylene glycol powder (GLYCOLAX/MIRALAX) powder TAKE 17 GRAMS (1 CAPFUL) IN 8 OZ OF FLUID ONCE DAILY 527 g 2  . Respiratory Therapy Supplies (FLUTTER) DEVI Blow through as directed 1 each 0  . tamsulosin (FLOMAX) 0.4 MG CAPS capsule Take 1 capsule (0.4 mg total) by mouth daily. Must keep schedule appt for future refills 90 capsule 0   No current facility-administered medications for this visit.     Allergies: Tranexamic acid  Past Medical History:  Diagnosis Date  . Arthritis   . COPD (chronic obstructive pulmonary disease) ([place])   . Dysphagia   . Emphysema   . Emphysema of lung ([place])   . Exertional shortness of breath   . GERD (gastroesophageal reflux disease)   . Heart murmur    as a child  . High cholesterol   . Hx of diabetes mellitus   . Hypertension     Past Surgical History:  Procedure Laterality Date  . CATARACT EXTRACTION W/ INTRAOCULAR LENS IMPLANT Left 2011  . COLONOSCOPY N/A 01/03/2016   Procedure: COLONOSCOPY;  Surgeon: Aviva Signs, MD;  [facility];  Service: Gastroenterology;  Laterality: N/A;  . ESOPHAGEAL DILATION  2000's   x2  . EYE  SURGERY    . INGUINAL HERNIA REPAIR Right 1970's  . INGUINAL HERNIA REPAIR Bilateral 03/12/2013   w/mesh bilaterally/notes 03/12/2013  . INGUINAL HERNIA REPAIR Bilateral 03/12/2013   Procedure: HERNIA REPAIR INGUINAL ADULT BILATERAL;  Surgeon: Merrie Roof, MD;  Location: [facility];  Service: General;  Laterality: Bilateral;  . INSERTION OF MESH Bilateral 03/12/2013   Procedure: INSERTION OF MESH;  Surgeon: Merrie Roof, MD;  Location: [facility];  Service: General;  Laterality: Bilateral;  . RETINAL DETACHMENT SURGERY Right 08/29/2010  . TONSILLECTOMY  1940's  . TRANSURETHRAL  RESECTION OF PROSTATE  2002; 2013    Family History  Problem Relation Age of Onset  . Stroke Mother   . Healthy Father     Social History   Tobacco Use  . Smoking status: Former Smoker    Packs/day: 2.00    Years: 35.00    Pack years: 70.00    Types: Cigarettes    Last attempt to quit: 10/08/1985    Years since quitting: 32.3  . Smokeless tobacco: Former Systems developer    Types: [place] date: 10/08/1997  Substance Use Topics  . Alcohol use: No    Alcohol/week: 1.2 oz    Types: 2 Standard drinks or equivalent per week    Subjective:  Patient is accompanied with his wife today with concerns for elevated blood pressure. PT was at patient's home today and noted blood pressure to be very elevated- at 180/75 and 190/89; patient notes he has been feeling very good and is very surprised at how elevated his pressure is today; per wife, patient does have a history of "high blood pressure" and has taken medication in the past; neither she nor patient remember what medication he has taken or how long he has been off medication. Denies any chest pain, shortness of breath, blurred vision or headache. In reviewing recent notes, patient's blood pressure has appeared to fluctuate over the past few months; he appears to have had unexplained bradycardia for at least the past year;     Objective:  Vitals:   02/18/18 1612  BP: (!) 160/78  Pulse: (!) 51  Temp: 98.8 F (37.1 C)  TempSrc: Oral  SpO2: 94%  Weight: 166 lb 1.3 oz (75.3 kg)  Height: 5\' 10"  (1.778 m)    General: Well developed, well nourished, in no acute distress  Skin : Warm and dry.  Head: Normocephalic and atraumatic  Lungs: Respirations unlabored; clear to auscultation bilaterally without wheeze, rales, rhonchi  CVS exam: normal rate and regular rhythm.  Neurologic: Alert and oriented; speech intact; face symmetrical; moves all extremities well; CNII-XII intact without focal deficit   Assessment:  1. Bradycardia   2. Essential  hypertension     Plan:  Rate in office is regular but am concerned for unexplained source of bradycardia; will give low dose of Amlodipine 2.5 mg daily; Wife is adamant that she does not want an EKG for her husband since he just had one at his pulmonologist in March and at [facility] in early April.  Will refer to cardiology for further evaluation.   No follow-ups on file.  No orders of the defined types were placed in this encounter.   Requested Prescriptions   Signed Prescriptions Disp Refills  . amLODipine (NORVASC) 2.5 MG tablet 90 tablet 0    Sig: Take 1 tablet (2.5 mg total) by mouth daily.

## 2018-02-19 ENCOUNTER — Other Ambulatory Visit: Payer: Self-pay | Admitting: Family

## 2018-02-19 DIAGNOSIS — R001 Bradycardia, unspecified: Secondary | ICD-10-CM

## 2018-02-19 NOTE — Telephone Encounter (Signed)
Patient spouse is calling back and states that she lives in [place] and there is a doctor with [facility] Dr. Domenic Polite that comes to [facility] and she would like the appt to be set up with him.

## 2018-02-19 NOTE — Telephone Encounter (Signed)
Spoke with Mrs. Radford today and info given regarding referral.

## 2018-02-19 NOTE — Telephone Encounter (Signed)
Are you able to update her referral for [facility] office in [facility] for Dr. Rozann Lesches?  Thanks

## 2018-03-13 ENCOUNTER — Other Ambulatory Visit: Payer: Self-pay | Admitting: Nurse Practitioner

## 2018-03-13 DIAGNOSIS — I639 Cerebral infarction, unspecified: Secondary | ICD-10-CM

## 2018-03-25 ENCOUNTER — Other Ambulatory Visit (INDEPENDENT_AMBULATORY_CARE_PROVIDER_SITE_OTHER): Payer: Medicare Other

## 2018-03-25 ENCOUNTER — Ambulatory Visit: Payer: Medicare Other | Admitting: Nurse Practitioner

## 2018-03-25 ENCOUNTER — Encounter: Payer: Self-pay | Admitting: Nurse Practitioner

## 2018-03-25 VITALS — BP 160/72 | HR 48 | Temp 97.6°F | Resp 20 | Ht 70.0 in | Wt 166.0 lb

## 2018-03-25 DIAGNOSIS — I10 Essential (primary) hypertension: Secondary | ICD-10-CM

## 2018-03-25 DIAGNOSIS — E782 Mixed hyperlipidemia: Secondary | ICD-10-CM | POA: Diagnosis not present

## 2018-03-25 DIAGNOSIS — E119 Type 2 diabetes mellitus without complications: Secondary | ICD-10-CM

## 2018-03-25 DIAGNOSIS — R7989 Other specified abnormal findings of blood chemistry: Secondary | ICD-10-CM

## 2018-03-25 DIAGNOSIS — Z125 Encounter for screening for malignant neoplasm of prostate: Secondary | ICD-10-CM

## 2018-03-25 DIAGNOSIS — I63239 Cerebral infarction due to unspecified occlusion or stenosis of unspecified carotid arteries: Secondary | ICD-10-CM | POA: Diagnosis not present

## 2018-03-25 DIAGNOSIS — R350 Frequency of micturition: Secondary | ICD-10-CM

## 2018-03-25 LAB — MICROALBUMIN / CREATININE URINE RATIO
CREATININE, U: 202.3 mg/dL
Microalb Creat Ratio: 12 mg/g (ref 0.0–30.0)
Microalb, Ur: 24.2 mg/dL — ABNORMAL HIGH (ref 0.0–1.9)

## 2018-03-25 LAB — CBC
HEMATOCRIT: 41.5 % (ref 39.0–52.0)
Hemoglobin: 13.6 g/dL (ref 13.0–17.0)
MCHC: 32.8 g/dL (ref 30.0–36.0)
MCV: 92.7 fl (ref 78.0–100.0)
PLATELETS: 263 10*3/uL (ref 150.0–400.0)
RBC: 4.47 Mil/uL (ref 4.22–5.81)
RDW: 14.2 % (ref 11.5–15.5)
WBC: 13.4 10*3/uL — ABNORMAL HIGH (ref 4.0–10.5)

## 2018-03-25 LAB — COMPREHENSIVE METABOLIC PANEL
ALT: 13 U/L (ref 0–53)
AST: 15 U/L (ref 0–37)
Albumin: 4 g/dL (ref 3.5–5.2)
Alkaline Phosphatase: 84 U/L (ref 39–117)
BUN: 13 mg/dL (ref 6–23)
CALCIUM: 9.3 mg/dL (ref 8.4–10.5)
CO2: 37 meq/L — AB (ref 19–32)
Chloride: 93 mEq/L — ABNORMAL LOW (ref 96–112)
Creatinine, Ser: 0.77 mg/dL (ref 0.40–1.50)
GFR: 102.75 mL/min (ref >60.00)
Glucose, Bld: 120 mg/dL — ABNORMAL HIGH (ref 70–99)
Potassium: 4.4 mEq/L (ref 3.5–5.1)
Sodium: 137 mEq/L (ref 135–145)
Total Bilirubin: 0.6 mg/dL (ref 0.2–1.2)
Total Protein: 6.5 g/dL (ref 6.0–8.3)

## 2018-03-25 LAB — LIPID PANEL
CHOL/HDL RATIO: 2
Cholesterol: 122 mg/dL (ref 0–200)
HDL: 56.3 mg/dL (ref >39.00)
LDL Cholesterol: 54 mg/dL (ref 0–99)
NONHDL: 65.28
Triglycerides: 58 mg/dL (ref 0.0–149.0)
VLDL: 11.6 mg/dL (ref 0.0–40.0)

## 2018-03-25 LAB — PSA: PSA: 2.5 ng/mL (ref 0.10–4.00)

## 2018-03-25 NOTE — Patient Instructions (Addendum)
Please head downstairs for lab work/x-rays. If any of your test results are critically abnormal, you will be contacted right away. Your results may be released to your MyChart for viewing before I am able to provide you with my response. I will contact you within a week about your test results and any recommendations for abnormalities.  We will decide when you should come back to see me again when your labs return.

## 2018-03-25 NOTE — Progress Notes (Signed)
Name: Luis [name]   MRN: [id number]    DOB: July 27, 1934   Date:03/25/2018       Progress Note  Subjective  Chief Complaint  Chief Complaint  Patient presents with  . Follow-up    blood pressure medication     HPI Luis [name] is Here today for routine follow up of chronic medical conditions. He is accompanied by his wife. Aside from primary care, he follows routinely with neurology for moderate vascular dementia and history of CVA, pulmonology for COPD and chronic respiratory failure, and endocrinology Dr Luis [name] for routine diabetes maintenance, which he reports is controlled by diet and exercise. He overall feels well today and does not have any complaints  Hypertension -low dose of Amlodipine 2.5 mg started on 02/18/18 for elevated BP reading and a referral was made to cardiology for further evaluation of bradycardia and labile BP readings, with upcoming appointment next week. Reports he has been taking the amlodipine daily without noted adverse medication effects.  Reports readings have continued to vary at home, ranging from 120-160/50s-70s Denies headaches, vision changes, chest pain, edema. He is chronically SOB due to his COPD, no worse today  BP Readings from Last 3 Encounters:  03/25/18 (!) 160/72  02/18/18 (!) 160/78  01/09/18 (!) 154/95   Cholesterol- maintained on atorvastatin 80 daily Reports daily medication compliance without noted adverse medication effects including nausea, myalgias.  Lab Results  Component Value Date   CHOL 129 12/19/2016   HDL 50.50 12/19/2016   LDLCALC 64 12/19/2016   TRIG 69.0 12/19/2016   CHOLHDL 3 12/19/2016   Stroke- History of CVA, possibly due to small vessel disease per neurology note on 2/20/1. Followed by neurology. Maintained on plavix, aspirin, and statin for secondary prevention without any noted adverse effects.  Urinary frequency- Maintained on flomax 0.4mg  daily, which was started in 2017 for urinary frequency with normal PSAs  on last several checks. He reports improvement in urinary frequency with flomax No fevers, abdominal pain, pelvic pain, dysuria, hematuria.  Patient Active Problem List   Diagnosis Date Noted  . Medicare annual wellness visit, subsequent 12/19/2016  . Encounter for general adult medical examination with abnormal findings 12/19/2016  . Vascular dementia without behavioral disturbance 11/21/2016  . TIA (transient ischemic attack) 11/12/2016  . Hypokalemia 08/01/2016  . Cough 07/13/2016  . General weakness 07/13/2016  . History of stroke 12/29/2015  . GI bleed 12/19/2015  . Mild cognitive impairment 07/26/2015  . Hyperlipidemia 07/26/2015  . Acute encephalopathy   . Altered mental status 07/15/2015  . COPD exacerbation ([place]) 07/15/2015  . Hyponatremia 07/15/2015  . Essential hypertension 07/15/2015  . CVA (cerebral vascular accident) ([place]) 07/15/2015  . Chronic respiratory failure with hypoxia ([place]) 07/15/2015  . Diabetes mellitus ([place]) 07/15/2015  . Bilateral inguinal hernia 03/04/2013  . Vasomotor rhinitis 11/29/2012  . COPD mixed type ([place]) 11/08/2012    Past Surgical History:  Procedure Laterality Date  . CATARACT EXTRACTION W/ INTRAOCULAR LENS IMPLANT Left 2011  . COLONOSCOPY N/A 01/03/2016   Procedure: COLONOSCOPY;  Surgeon: Aviva Signs, MD;  [facility];  Service: Gastroenterology;  Laterality: N/A;  . ESOPHAGEAL DILATION  2000's   x2  . EYE SURGERY    . INGUINAL HERNIA REPAIR Right 1970's  . INGUINAL HERNIA REPAIR Bilateral 03/12/2013   w/mesh bilaterally/notes 03/12/2013  . INGUINAL HERNIA REPAIR Bilateral 03/12/2013   Procedure: HERNIA REPAIR INGUINAL ADULT BILATERAL;  Surgeon: Merrie Roof, MD;  Location: [facility];  Service: General;  Laterality: Bilateral;  . INSERTION OF MESH Bilateral 03/12/2013   Procedure: INSERTION OF MESH;  Surgeon: Merrie Roof, MD;  Location: [facility];  Service: General;  Laterality: Bilateral;  . RETINAL DETACHMENT SURGERY Right  08/29/2010  . TONSILLECTOMY  1940's  . TRANSURETHRAL RESECTION OF PROSTATE  2002; 2013    Family History  Problem Relation Age of Onset  . Stroke Mother   . Healthy Father     Social History   Socioeconomic History  . Marital status: Married    Spouse name: Not on file  . Number of children: 3  . Years of education: 78  . Highest education level: Not on file  Occupational History  . Occupation: Retired  Scientific laboratory technician  . Financial resource strain: Not on file  . Food insecurity:    Worry: Not on file    Inability: Not on file  . Transportation needs:    Medical: Not on file    Non-medical: Not on file  Tobacco Use  . Smoking status: Former Smoker    Packs/day: 2.00    Years: 35.00    Pack years: 70.00    Types: Cigarettes    Last attempt to quit: 10/08/1985    Years since quitting: 32.4  . Smokeless tobacco: Former Systems developer    Types: [place] date: 10/08/1997  Substance and Sexual Activity  . Alcohol use: No    Alcohol/week: 1.2 oz    Types: 2 Standard drinks or equivalent per week  . Drug use: No  . Sexual activity: Not Currently  Lifestyle  . Physical activity:    Days per week: Not on file    Minutes per session: Not on file  . Stress: Not on file  Relationships  . Social connections:    Talks on phone: Not on file    Gets together: Not on file    Attends religious service: Not on file    Active member of club or organization: Not on file    Attends meetings of clubs or organizations: Not on file    Relationship status: Not on file  . Intimate partner violence:    Fear of current or ex partner: Not on file    Emotionally abused: Not on file    Physically abused: Not on file    Forced sexual activity: Not on file  Other Topics Concern  . Not on file  Social History Narrative   Fun: Golf   Denies abuse and feels safe at home.      Current Outpatient Medications:  .  amLODipine (NORVASC) 2.5 MG tablet, Take 1 tablet (2.5 mg total) by mouth daily.,  Disp: 90 tablet, Rfl: 0 .  aspirin EC 325 MG EC tablet, Take 1 tablet (325 mg total) by mouth daily., Disp: 30 tablet, Rfl: 0 .  atorvastatin (LIPITOR) 80 MG tablet, TAKE ONE TABLET BY MOUTH DAILY., Disp: 30 tablet, Rfl: 1 .  Cholecalciferol (D3 MAXIMUM STRENGTH) 5000 UNITS capsule, Take 5,000 Units by mouth daily., Disp: , Rfl:  .  clopidogrel (PLAVIX) 75 MG tablet, TAKE ONE TABLET BY MOUTH DAILY., Disp: 90 tablet, Rfl: 1 .  divalproex (DEPAKOTE ER) 250 MG 24 hr tablet, Take 1 tablet (250 mg total) by mouth daily., Disp: 90 tablet, Rfl: 3 .  donepezil (ARICEPT) 10 MG tablet, Take 1 tablet daily, Disp: 90 tablet, Rfl: 3 .  escitalopram (LEXAPRO) 5 MG tablet, Take 1 tablet (5 mg total) by mouth daily., Disp: 30 tablet,  Rfl: 11 .  formoterol (PERFOROMIST) 20 MCG/2ML nebulizer solution, Take 2 mLs (20 mcg total) by nebulization 2 (two) times daily., Disp: 120 mL, Rfl: 6 .  nitroGLYCERIN (NITROSTAT) 0.4 MG SL tablet, Place 0.4 mg under the tongue every 5 (five) minutes as needed for chest pain. Reported on 03/19/2016, Disp: , Rfl:  .  OXYGEN, Inhale into the lungs. On 2 liters during the day and at night, Disp: , Rfl:  .  Respiratory Therapy Supplies (FLUTTER) DEVI, Blow through as directed, Disp: 1 each, Rfl: 0 .  tamsulosin (FLOMAX) 0.4 MG CAPS capsule, Take 1 capsule (0.4 mg total) by mouth daily. Must keep schedule appt for future refills, Disp: 90 capsule, Rfl: 0  Allergies  Allergen Reactions  . Tranexamic Acid Anaphylaxis    Had in 09/09/2016 in [facility] ED and had anaphylaxis reaction     ROS See HPI  Objective  Vitals:   03/25/18 1155  BP: (!) 160/72  Pulse: (!) 48  Resp: 20  Temp: 97.6 F (36.4 C)  TempSrc: Oral  SpO2: 94%  Weight: 166 lb (75.3 kg)  Height: 5\' 10"  (1.778 m)  recent evaluation of bradycardia in our office on 02/18/18, he is scheduled to see cardiology for F/U of bradycardia next week  Body mass index is 23.82 kg/m.  Physical Exam Vital signs  reviewed. Constitutional: Patient appears well-developed and well-nourished. No distress. Continuous oxygen via nasal cannula HENT: Head: Normocephalic and atraumatic.  Nose: Nose normal. Mouth/Throat: Oropharynx is clear and moist. No oropharyngeal exudate.  Eyes: Conjunctivae and EOM are normal. Pupils are equal, round, and reactive to light. No scleral icterus.  Neck: Normal range of motion. Neck supple.  Cardiovascular: Bradycardic rate, regular rhythm and normal heart sounds.  No BLE edema. Distal pulses intact. Pulmonary/Chest: Effort normal, lung sounds diminished. No respiratory distress. Neurological: He is alert and oriented. No cranial nerve deficit. Coordination, balance, strength, speech and gait are normal.  Skin: Skin is warm and dry. No rash noted. No erythema.  Psychiatric: Patient has a normal mood and affect. behavior is normal. Judgment and thought content normal.   Assessment & Plan F/U TBD pending lab results  Healthcare maintenance- Type 2 diabetes mellitus without complication, unspecified whether long term insulin use ([place])- Microalbumin / creatinine urine ratio; Future Screening for prostate cancer- PSA; Future

## 2018-03-26 ENCOUNTER — Encounter: Payer: Self-pay | Admitting: Nurse Practitioner

## 2018-03-26 NOTE — Assessment & Plan Note (Signed)
Stable, no complaints today Continue flomax Update labs - PSA; Future

## 2018-03-26 NOTE — Assessment & Plan Note (Signed)
Continue asa, plavix, statin Continue routine follow up with neurology

## 2018-03-26 NOTE — Assessment & Plan Note (Signed)
He is not fasting today, ate oatmeal , fruit and pecan roll a few hours prior to visit. Continue atorvastain Update labs F/U with further recommendations pending lab results - Lipid panel; Future

## 2018-03-26 NOTE — Assessment & Plan Note (Signed)
BP remains elevated in office today with some reported normal to low readings at home, will not adjust medications today Continue amlodipine 2.5 daily F/U with cardiology next week as scheduled for further evaluation - CBC; Future - Comprehensive metabolic panel; Future

## 2018-04-01 ENCOUNTER — Encounter: Payer: Self-pay | Admitting: Cardiology

## 2018-04-01 NOTE — Progress Notes (Signed)
Cardiology Office Note  Date: 04/02/2018   ID: [name], DOB 1934-04-23, MRN [id number]  PCP: Lance Sell, NP  Consulting Cardiologist: Rozann Lesches, MD   Chief Complaint  Patient presents with  . Bradycardia    History of Present Illness: Luis [name] is an 84 y.o. male referred for cardiology consultation by Ms. Shambley NP for the evaluation of bradycardia and elevated blood pressure.  He is here today with his wife.  I reviewed his history and updated the chart.  He has been placed on low-dose Norvasc recently for treatment of hypertension.  I went over his home blood pressure and heart rate checks provided by his wife.  He is tolerating Norvasc, and although his average systolic is around 220, he does have some measurements in the 100-120 range.  Most of his heart rates are in the low 50s, some in the high 40s.  Review of his vital signs over time in EPIC shows that heart rates have intermittently been recorded in the low 50s going back to 2017, although more regularly of late.  He does not report any lightheadedness or syncope.  His wife confirms this.  She states that he is very sedentary, moves about in his house on home oxygen but does not get outdoors.  I reviewed his medications which are outlined below.  He is on Aricept 10 mg daily with history of vascular dementia, no AV nodal blocking drugs.  I personally reviewed his ECG today which shows a sinus bradycardia in the low 50s with lead motion artifact and PAC, nonspecific ST changes.  Past Medical History:  Diagnosis Date  . Arthritis   . COPD, severe ([place])   . Dysphagia   . GERD (gastroesophageal reflux disease)   . Heart murmur    In childhood  . History of stroke   . Hyperlipidemia   . Hypertension   . Type 2 diabetes mellitus ([place])   . Vascular dementia     Past Surgical History:  Procedure Laterality Date  . CATARACT EXTRACTION W/ INTRAOCULAR LENS IMPLANT Left 2011  . COLONOSCOPY  N/A 01/03/2016   Procedure: COLONOSCOPY;  Surgeon: Aviva Signs, MD;  [facility];  Service: Gastroenterology;  Laterality: N/A;  . ESOPHAGEAL DILATION  2000's   x2  . EYE SURGERY    . INGUINAL HERNIA REPAIR Right 1970's  . INGUINAL HERNIA REPAIR Bilateral 03/12/2013   w/mesh bilaterally/notes 03/12/2013  . INGUINAL HERNIA REPAIR Bilateral 03/12/2013   Procedure: HERNIA REPAIR INGUINAL ADULT BILATERAL;  Surgeon: Merrie Roof, MD;  Location: [facility];  Service: General;  Laterality: Bilateral;  . INSERTION OF MESH Bilateral 03/12/2013   Procedure: INSERTION OF MESH;  Surgeon: Merrie Roof, MD;  Location: [facility];  Service: General;  Laterality: Bilateral;  . RETINAL DETACHMENT SURGERY Right 08/29/2010  . TONSILLECTOMY  1940's  . TRANSURETHRAL RESECTION OF PROSTATE  2002; 2013    Current Outpatient Medications  Medication Sig Dispense Refill  . amLODipine (NORVASC) 2.5 MG tablet Take 1 tablet (2.5 mg total) by mouth daily. 90 tablet 0  . aspirin EC 325 MG EC tablet Take 1 tablet (325 mg total) by mouth daily. 30 tablet 0  . atorvastatin (LIPITOR) 80 MG tablet TAKE ONE TABLET BY MOUTH DAILY. 30 tablet 1  . Cholecalciferol (D3 MAXIMUM STRENGTH) 5000 UNITS capsule Take 5,000 Units by mouth daily.    . clopidogrel (PLAVIX) 75 MG tablet TAKE ONE TABLET BY MOUTH DAILY. 90 tablet  1  . divalproex (DEPAKOTE ER) 250 MG 24 hr tablet Take 1 tablet (250 mg total) by mouth daily. 90 tablet 3  . donepezil (ARICEPT) 10 MG tablet Take 1 tablet daily 90 tablet 3  . escitalopram (LEXAPRO) 5 MG tablet Take 1 tablet (5 mg total) by mouth daily. 30 tablet 11  . formoterol (PERFOROMIST) 20 MCG/2ML nebulizer solution Take 2 mLs (20 mcg total) by nebulization 2 (two) times daily. 120 mL 6  . nitroGLYCERIN (NITROSTAT) 0.4 MG SL tablet Place 0.4 mg under the tongue every 5 (five) minutes as needed for chest pain. Reported on 03/19/2016    . OXYGEN Inhale into the lungs. On 2 liters during the day and at night      . Respiratory Therapy Supplies (FLUTTER) DEVI Blow through as directed 1 each 0  . tamsulosin (FLOMAX) 0.4 MG CAPS capsule Take 1 capsule (0.4 mg total) by mouth daily. Must keep schedule appt for future refills 90 capsule 0   No current facility-administered medications for this visit.    Allergies:  Tranexamic acid   Social History: The patient  reports that he quit smoking about 32 years ago. His smoking use included cigarettes. He has a 70.00 pack-year smoking history. He quit smokeless tobacco use about 20 years ago. His smokeless tobacco use included chew. He reports that he does not drink alcohol or use drugs.   Family History: The patient's family history includes Healthy in his father; Stroke in his mother.   ROS:  Please see the history of present illness. Otherwise, complete review of systems is positive for hearing loss, chronic shortness of breath.  All other systems are reviewed and negative.   Physical Exam: VS:  BP 100/66   Pulse (!) 51   Ht 6' (1.829 m)   Wt 166 lb (75.3 kg)   SpO2 91%   BMI 22.51 kg/m , BMI Body mass index is 22.51 kg/m.  Wt Readings from Last 3 Encounters:  04/02/18 166 lb (75.3 kg)  03/25/18 166 lb (75.3 kg)  02/18/18 166 lb 1.3 oz (75.3 kg)    General: Elderly male wearing region via nasal cannula.  Appears comfortable at rest. HEENT: Conjunctiva and lids normal, oropharynx clear. Neck: Supple, no elevated JVP or carotid bruits, no thyromegaly. Lungs: Clear to auscultation, nonlabored breathing at rest. Cardiac: Regular rate and rhythm, no S3, soft systolic murmur. Abdomen: Soft, nontender, bowel sounds present. Extremities: Trace ankle edema, distal pulses 2+. Skin: Warm and dry. Musculoskeletal: No kyphosis. Neuropsychiatric: Alert and oriented x2, affect grossly appropriate.  ECG: I personally reviewed the tracing from 01/09/2018 which showed sinus rhythm with frequent PACs.  Recent Labwork: 03/25/2018: ALT 13; AST 15; BUN 13;  Creatinine, Ser 0.77; Hemoglobin 13.6; Platelets 263.0; Potassium 4.4; Sodium 137     Component Value Date/Time   CHOL 122 03/25/2018 1254   TRIG 58.0 03/25/2018 1254   HDL 56.30 03/25/2018 1254   CHOLHDL 2 03/25/2018 1254   VLDL 11.6 03/25/2018 1254   LDLCALC 54 03/25/2018 1254    Other Studies Reviewed Today:  Carotid Dopplers 11/12/2016: IMPRESSION: Mild atherosclerotic disease in the bilateral carotid arteries. Estimated degree of stenosis in the internal carotid arteries is less than 50% bilaterally.  Limited evaluation of the left vertebral artery.  Echocardiogram 11/12/2016: Study Conclusions  - Left ventricle: The cavity size was normal. Systolic function was   normal. The estimated ejection fraction was in the range of 55%   to 60%. Wall motion was normal; there were  no regional wall   motion abnormalities. Doppler parameters are consistent with   abnormal left ventricular relaxation (grade 1 diastolic   dysfunction). - Mitral valve: Calcified annulus. Mildly thickened leaflets.  Assessment and Plan:  1.  Essential hypertension.  Agree with Norvasc as choice of antihypertensive as this should not contribute to bradycardia with no major AV nodal blocking effects.  At this point I do not plan to advance the dose, would simply follow blood pressure trend with PCP.  2.  Sinus bradycardia, not entirely clear that this is symptom provoking.  I reviewed his ECG.  Known side effect of Aricept is bradycardia, and this is likely contributing, although he probably has some degree of underlying conduction system disease at age 10.  Plan is to obtain a 24-hour Holter monitor to better assess heart rate variability.  Unless there are significant abnormalities, I would simply observe and not necessarily recommend stopping his Aricept.  We will call with these results.  3.  History of stroke with vascular dementia, currently on Aricept and antiplatelet regimen, followed by  neurology.  4.  Severe COPD with chronic hypoxic respiratory failure on oxygen.  He follows with Dr. Annamaria Boots.  Current medicines were reviewed with the patient today.   Orders Placed This Encounter  Procedures  . Holter monitor - 24 hour  . EKG 12-Lead    Disposition: Call with test results.  Signed, Satira Sark, MD, [facility] 04/02/2018 11:03 AM    [place] [facility] [place], [place], [place] [address] Phone: [phone]; Fax: [phone]

## 2018-04-02 ENCOUNTER — Encounter: Payer: Self-pay | Admitting: Cardiology

## 2018-04-02 ENCOUNTER — Ambulatory Visit: Payer: Medicare Other | Admitting: Cardiology

## 2018-04-02 VITALS — BP 100/66 | HR 51 | Ht 72.0 in | Wt 166.0 lb

## 2018-04-02 DIAGNOSIS — Z8673 Personal history of transient ischemic attack (TIA), and cerebral infarction without residual deficits: Secondary | ICD-10-CM | POA: Diagnosis not present

## 2018-04-02 DIAGNOSIS — I10 Essential (primary) hypertension: Secondary | ICD-10-CM | POA: Diagnosis not present

## 2018-04-02 DIAGNOSIS — J449 Chronic obstructive pulmonary disease, unspecified: Secondary | ICD-10-CM

## 2018-04-02 DIAGNOSIS — R001 Bradycardia, unspecified: Secondary | ICD-10-CM | POA: Diagnosis not present

## 2018-04-02 DIAGNOSIS — F0150 Vascular dementia without behavioral disturbance: Secondary | ICD-10-CM | POA: Diagnosis not present

## 2018-04-02 NOTE — Patient Instructions (Signed)
Medication Instructions:  Your physician recommends that you continue on your current medications as directed. Please refer to the Current Medication list given to you today.  Labwork: NONE  Testing/Procedures: Your physician has recommended that you wear a holter monitor FOR 24 HOURS. Holter monitors are medical devices that record the heart's electrical activity. Doctors most often use these monitors to diagnose arrhythmias. Arrhythmias are problems with the speed or rhythm of the heartbeat. The monitor is a small, portable device. You can wear one while you do your normal daily activities. This is usually used to diagnose what is causing palpitations/syncope (passing out).   Follow-Up: Your physician recommends that you schedule a follow-up appointment PENDING TEST RESULTS  Any Other Special Instructions Will Be Listed Below (If Applicable).  If you need a refill on your cardiac medications before your next appointment, please call your pharmacy.

## 2018-04-07 ENCOUNTER — Encounter (INDEPENDENT_AMBULATORY_CARE_PROVIDER_SITE_OTHER): Payer: Medicare Other

## 2018-04-07 DIAGNOSIS — R001 Bradycardia, unspecified: Secondary | ICD-10-CM

## 2018-04-08 ENCOUNTER — Telehealth: Payer: Self-pay

## 2018-04-08 NOTE — Telephone Encounter (Signed)
-----   Message from Satira Sark, MD sent at 04/07/2018  8:26 AM EDT ----- Results reviewed.  Cardiac monitor did not demonstrate any unusual degree of bradycardia, no pauses.  At this point would simply recommend observation.  No clear need for him to stop or reduce Aricept at this point.  Keep follow-up with PCP. A copy of this test should be forwarded to Lance Sell, NP.

## 2018-04-08 NOTE — Telephone Encounter (Signed)
Patient notified. Routed to PCP 

## 2018-04-09 ENCOUNTER — Other Ambulatory Visit (INDEPENDENT_AMBULATORY_CARE_PROVIDER_SITE_OTHER): Payer: Medicare Other

## 2018-04-09 DIAGNOSIS — R7989 Other specified abnormal findings of blood chemistry: Secondary | ICD-10-CM

## 2018-04-09 LAB — URINALYSIS
Bilirubin Urine: NEGATIVE
HGB URINE DIPSTICK: NEGATIVE
Ketones, ur: NEGATIVE
Leukocytes, UA: NEGATIVE
NITRITE: NEGATIVE
PH: 7 (ref 5.0–8.0)
Specific Gravity, Urine: 1.02 (ref 1.000–1.030)
Urine Glucose: NEGATIVE
Urobilinogen, UA: 0.2 (ref 0.0–1.0)

## 2018-04-09 LAB — CBC
HCT: 39.7 % (ref 39.0–52.0)
HEMOGLOBIN: 13.4 g/dL (ref 13.0–17.0)
MCHC: 33.7 g/dL (ref 30.0–36.0)
MCV: 92 fl (ref 78.0–100.0)
PLATELETS: 214 10*3/uL (ref 150.0–400.0)
RBC: 4.31 Mil/uL (ref 4.22–5.81)
RDW: 14.3 % (ref 11.5–15.5)
WBC: 8.5 10*3/uL (ref 4.0–10.5)

## 2018-04-10 LAB — URINE CULTURE
MICRO NUMBER:: 90793045
Result:: NO GROWTH
SPECIMEN QUALITY: ADEQUATE

## 2018-05-19 ENCOUNTER — Other Ambulatory Visit: Payer: Self-pay | Admitting: Family

## 2018-05-20 ENCOUNTER — Encounter: Payer: Self-pay | Admitting: Neurology

## 2018-05-20 ENCOUNTER — Other Ambulatory Visit: Payer: Self-pay

## 2018-05-20 ENCOUNTER — Ambulatory Visit: Payer: Medicare Other | Admitting: Neurology

## 2018-05-20 DIAGNOSIS — F0150 Vascular dementia without behavioral disturbance: Secondary | ICD-10-CM

## 2018-05-20 MED ORDER — DONEPEZIL HCL 10 MG PO TABS: ORAL_TABLET | ORAL | 3 refills | Status: AC

## 2018-05-20 MED ORDER — DIVALPROEX SODIUM ER 250 MG PO TB24: 250.0000 mg | ORAL_TABLET | Freq: Every day | ORAL | 3 refills | Status: AC

## 2018-05-20 MED ORDER — ESCITALOPRAM OXALATE 5 MG PO TABS: 5.0000 mg | ORAL_TABLET | Freq: Every day | ORAL | 3 refills | Status: AC

## 2018-05-20 NOTE — Progress Notes (Signed)
NEUROLOGY FOLLOW UP OFFICE NOTE  KENDALE REMBOLD [id number]  DOB: Mar 22, 1934  HISTORY OF PRESENT ILLNESS: I had the pleasure of seeing Artha Chiasson in follow-up in the neurology clinic on 05/20/2018.  The patient was last seen 6 months ago after a small stroke and mild dementia, likely vascular etiology. He is again accompanied by his wife and son who help supplement the history today. MMSE in August 2018 was 22/30 (25/30 in February 2018, 24/30 in October 2017, 27/30 in March 2017). He is tolerating Aricept 89m daily without side effects. He is taking low dose Depakote 2519mqhs for mood stabilization, and was started on low dose Lexapro 76m67maily on his last visit. His son had called last April 2019 about worsening confusion, his wife reported he went to bed fully clothed, then was in the living room the next morning saying he could not find the bedroom. He was taken to the ER where he had an infectious workup which was negative, he had a limited MRI brain (unable to complete entire study) with no acute changes seen. Family mostly needs more help at home, his wife was brought to the ER recently and symptoms were felt to be stress-induced, she became tearful in the office today. Family reports continued cognitive decline, but he is also having brief confusion more noticeable when off O2. He needs to wear O2 at all times, and gets short of breath with short distances, even with O2. Mood is better with addition of Lexapro. Sleep is good, he does not wander. His wife puts out his clothes and he is able to dress himself. He refuses baths, he had not showered in more than 2 months until for today's visit, but he states he takes sponge baths. No hallucinations or paranoia. He denies any headaches, dizziness, vision changes, no falls. He had seen Cardiology for sinus bradycardia, likely due to Aricept, but with cardiac monitor not showing any unusual degree of bradycardia or pauses, observation was recommended  instead of stopping Aricept.   History on Initial Assessment 07/26/2015: This is a very pleasant 84 9 RH man with a history of hypertension, hyperlipidemia, diabetes, COPD, who presented for evaluation of worsening memory and hospital follow-up after recent stroke in October 2016. He feels his memory "could be better," he sometimes has to think when asked a question. His family reports memory changes started over the past year, he would forget names, conversations, ask the same questions. He would misplace things. His wife has recently started to pay attention to make sure he takes his medications, but he denies missing any doses. He continues to balance their checkbook and denies any missed bill payments. Prior to the stroke, he was driving and denied getting lost, but family has noticed his wife was doing more of the driving. He has occasional word-finding difficulties. He denies any difficulties with ADLs. On 07/15/15, they were at a picnic when family noticed that something was "not right," he had rambling speech and was not making sense, starting off a sentence then drifting off. There was no slurred speech or focal weakness noted. [facility] showed a small acute infarct in the left external capsule. I personally reviewed MRI brain, in addition there was moderate chronic microvascular disease. Carotid dopplers showed less than 50% stenosis bilaterally, mild plaque at the level of the carotid bulbs and ICA as well as the right common carotid artery. Echocardiogram showed EF 65-70%, normal left atrium. He had  been taking low dose aspirin, and was switched to full dose 364m aspirin. Lipid panel showed total cholesterol 164, LDL 88. Simvastatin dose increased to 254mdaily. HbA1c 6.8. He was also diagnosed with hyponatremia with sodium of 128 and hypoxia from COPD exacerbation. On hospital discharge, sodium level was 132. He was back to cognitive baseline. He denies any  significant head injuries. No family history of dementia. He has always had a poor sense of smell.   [facility] His wife reports that he had more confusion than baseline. He was watching the Super Bowl when she noticed he was rubbing his forehead and she asked if he had a headache. He stood up to go to the bathroom and could not find it. Since similar symptoms occurred with prior stroke, his [facility] called EMS[facility] which I personally reviewed, there was a punctate focus of restricted diffusion in the medial anterior right frontal lobe. There was diffuse atrophy and severe age-advanced chronic microvascular disease with confluent white matter changes seen. Stroke workup was unremarkable with echocardiogram showing EF 5566-44%grade 1 diastolic dysfunction, normal left atrium. Carotid dopplers did not show any significant stenosis, estimated less than 50% bilaterally. He was evaluated by neurologist Dr. DoMerlene Laughterit was doubtful that the tiny frontal infarct was the cause of his marked encephalopathy. His EEG showed mild diffuse slowing. Bloodwork for vasculitis showed normal ESR, CRP, homocysteine, RPR, ANA. Normal TSH and B12. He was discharged home on aspirin 32549maily and Plavix.   Diagnostic Data: Brain MRA did not show any branch occlusion or flow limiting stenosis to explain the recent small frontal infarct. There was subtle but convincing delayed opacification of the left vertebral artery, correlating with previous Doppler findings. This could be related to stenosis at the origin, but the lumen is intermittently and asymmetrically irregular, remote dissection is also considered. Left vertebral findings have an overall chronic appearance as noted above. The dominant right vertebral artery is widely patent to the basilar. Advanced chronic microvascular disease in the cerebral white matter. Bilateral PCA atheromatous narrowings, mild on the left and  advanced on the right.  PAST MEDICAL HISTORY: Past Medical History:  Diagnosis Date  . Arthritis   . [place], severe ([place] . Dysphagia   . GERD (gastroesophageal reflux disease)   . Heart murmur    In childhood  . History of stroke   . Hyperlipidemia   . Hypertension   . [place] 2 [place] ([place] . Vascular dementia     MEDICATIONS: Current Outpatient Medications on File Prior to Visit  Medication Sig Dispense Refill  . amLODipine (NORVASC) 2.5 MG tablet TAKE ONE TABLET BY MOUTH DAILY 90 tablet 0  . aspirin EC 325 MG EC tablet Take 1 tablet (325 mg total) by mouth daily. 30 tablet 0  . atorvastatin (LIPITOR) 80 MG tablet TAKE ONE TABLET BY MOUTH DAILY. 30 tablet 1  . Cholecalciferol (D3 MAXIMUM STRENGTH) 5000 UNITS capsule Take 5,000 Units by mouth daily.    . clopidogrel (PLAVIX) 75 MG tablet TAKE ONE TABLET BY MOUTH DAILY. 90 tablet 1  . divalproex (DEPAKOTE ER) 250 MG 24 hr tablet Take 1 tablet (250 mg total) by mouth daily. 90 tablet 3  . donepezil (ARICEPT) 10 MG tablet Take 1 tablet daily 90 tablet 3  . escitalopram (LEXAPRO) 5 MG tablet Take 1 tablet (5 mg total) by mouth daily. 30 tablet 11  .  formoterol (PERFOROMIST) 20 MCG/2ML nebulizer solution Take 2 mLs (20 mcg total) by nebulization 2 (two) times daily. 120 mL 6  . nitroGLYCERIN (NITROSTAT) 0.4 MG SL tablet Place 0.4 mg under the tongue every 5 (five) minutes as needed for chest pain. Reported on 03/19/2016    . OXYGEN Inhale into the lungs. On 2 liters during the day and at night    . Respiratory Therapy Supplies (FLUTTER) DEVI Blow through as directed 1 each 0  . tamsulosin (FLOMAX) 0.4 MG CAPS capsule Take 1 capsule (0.4 mg total) by mouth daily. Must keep schedule appt for future refills 90 capsule 0   No current facility-administered medications on file prior to visit.     ALLERGIES: Allergies  Allergen Reactions  . Tranexamic Acid Anaphylaxis    Had in 09/09/2016 in [facility] ED and had anaphylaxis reaction     FAMILY HISTORY: Family History  Problem Relation Age of Onset  . Stroke Mother   . Healthy Father     SOCIAL HISTORY: Social History   Socioeconomic History  . Marital status: Married    Spouse name: Not on file  . Number of children: 3  . Years of education: 4  . Highest education level: Not on file  Occupational History  . Occupation: Retired  Scientific laboratory technician  . Financial resource strain: Not on file  . Food insecurity:    Worry: Not on file    Inability: Not on file  . Transportation needs:    Medical: Not on file    Non-medical: Not on file  Tobacco Use  . Smoking status: Former Smoker    Packs/day: 2.00    Years: 35.00    Pack years: 70.00    Types: Cigarettes    Last attempt to quit: 10/08/1985    Years since quitting: 32.6  . Smokeless tobacco: Former Systems developer    Types: [place] date: 10/08/1997  Substance and Sexual Activity  . Alcohol use: No    Alcohol/week: 2.0 standard drinks    Types: 2 Standard drinks or equivalent per week  . Drug use: No  . Sexual activity: Not Currently  Lifestyle  . Physical activity:    Days per week: Not on file    Minutes per session: Not on file  . Stress: Not on file  Relationships  . Social connections:    Talks on phone: Not on file    Gets together: Not on file    Attends religious service: Not on file    Active member of club or organization: Not on file    Attends meetings of clubs or organizations: Not on file    Relationship status: Not on file  . Intimate partner violence:    Fear of current or ex partner: Not on file    Emotionally abused: Not on file    Physically abused: Not on file    Forced sexual activity: Not on file  Other Topics Concern  . Not on file  Social History Narrative   Fun: Golf   Denies abuse and feels safe at home.     REVIEW OF SYSTEMS: Constitutional: No fevers, chills, or sweats, no generalized fatigue, change in appetite Eyes: No visual changes, double vision, eye pain Ear,  nose and throat: No hearing loss, ear pain, nasal congestion, sore throat Cardiovascular: No chest pain, palpitations Respiratory:  No shortness of breath at rest or with exertion, wheezes GastrointestinaI: No nausea, vomiting, diarrhea, abdominal pain, fecal incontinence Genitourinary:  No dysuria, urinary retention or frequency Musculoskeletal:  No neck pain, back pain Integumentary: No rash, pruritus, skin lesions Neurological: as above Psychiatric: No depression, insomnia, anxiety Endocrine: No palpitations, fatigue, diaphoresis, mood swings, change in appetite, change in weight, increased thirst Hematologic/Lymphatic:  No anemia, purpura, petechiae. Allergic/Immunologic: no itchy/runny eyes, nasal congestion, recent allergic reactions, rashes  PHYSICAL EXAM: Vitals:   05/20/18 1118  BP: (!) 142/68  Pulse: (!) 49  SpO2: 95%   General: No acute distress, on O2 nasal cannula Head:  Normocephalic, he has a crusted abrasion on the right side of his nose Neck: supple, no paraspinal tenderness, full range of motion Heart:  Regular rate and rhythm Lungs:  Clear to auscultation bilaterally Back: No paraspinal tenderness Skin/Extremities: No rash, no edema Neurological Exam: alert and oriented to person, place, and month/season. No aphasia or dysarthria. Fund of knowledge is reduced.  Remote memory impaired.  Attention and concentration are reduced, 1/5 WORLD backwards. 0/3 delayed recall.  Able to name objects and repeat phrases. CDT  MMSE - Mini Mental State Exam 05/20/2018 05/22/2017 11/21/2016  Orientation to time 2 2 3   Orientation to Place 4 5 5   Registration 3 3 3   Attention/ Calculation 1 3 5   Recall 0 0 0  Language- name 2 objects 2 2 2   Language- repeat 1 1 1   Language- follow 3 step command 3 3 3   Language- read & follow direction 1 1 1   Write a sentence 1 1 1   Copy design 1 1 1   Total score 19 22 25    Cranial nerves: Pupils equal, round, reactive to light.  Extraocular  movements intact with no nystagmus. Visual fields full. Facial sensation intact. No facial asymmetry. Tongue, uvula, palate midline.  Motor: Bulk and tone normal, muscle strength 5/5 throughout with no pronator drift.  Sensation to light touch intact.  No extinction to double simultaneous stimulation.  Deep tendon reflexes +2 throughout, toes downgoing.  Finger to nose testing intact.  Gait not tested.  IMPRESSION: This is a pleasant 84 yo RH man with a history of hypertension, hyperlipidemia, diabetes, COPD, with worsening memory and strokes, most recently last February 2018. His MRI brain shows severe chronic microvascular disease. The stroke could be due to small vessel disease, echo and carotid dopplers unremarkable. Vasculitis is also considered, bloodwork normal. Continue maximum medical management with aspirin and Plavix, lipid and BP control. Memory continues to decline, MMSE today 19/30 (22/30 in August 2018). He is taking Aricept 54m daily with some bradycardia, continue to monitor. Continue Depakote and Lexapro for mood. Mostly needed at this time is more help at home, but family has been having difficulties finding resources locally. More contact information was given today to hopefully help, but we also discussed starting to look into assisted living facilities where they could both move to and have increased level of care. He will follow-up in 6 months and knows to call our office for any problems.   Thank you for allowing me to participate in his care.  Please do not hesitate to call for any questions or concerns.  The duration of this appointment visit was 30 minutes of face-to-face time with the patient.  Greater than 50% of this time was spent in counseling, explanation of diagnosis, planning of further management, and coordination of care.   KEllouise Newer M.D.   CC: ACaesar Chestnut NP

## 2018-05-20 NOTE — Patient Instructions (Signed)
1. Continue all your medications 2. Continue looking into getting more help at home. Try contacting Lennon Alstrom with the [facility] and asking the [facility] if they could provide additional resources in your area:  Lennon Alstrom Address: [place] [address] [place] Phone: [phone]  3. Would also start thinking about assisted living facilities where we can get more help  4. Follow-up in 6 months or so, call for any changes  FALL PRECAUTIONS: Be cautious when walking. Scan the area for obstacles that may increase the risk of trips and falls. When getting up in the mornings, sit up at the edge of the bed for a few minutes before getting out of bed. Consider elevating the bed at the head end to avoid drop of blood pressure when getting up. Walk always in a well-lit room (use night lights in the walls). Avoid area rugs or power cords from appliances in the middle of the walkways. Use a walker or a cane if necessary and consider physical therapy for balance exercise. Get your eyesight checked regularly.  HOME SAFETY: Consider the safety of the kitchen when operating appliances like stoves, microwave oven, and blender. Consider having supervision and share cooking responsibilities until no longer able to participate in those. Accidents with firearms and other hazards in the house should be identified and addressed as well.  ABILITY TO BE LEFT ALONE: If patient is unable to contact 911 operator, consider using LifeLine, or when the need is there, arrange for someone to stay with patients. Smoking is a fire hazard, consider supervision or cessation. Risk of wandering should be assessed by caregiver and if detected at any point, supervision and safe proof recommendations should be instituted.  MEDICATION SUPERVISION: Inability to self-administer medication needs to be constantly addressed. Implement a mechanism to ensure safe administration  of the medications.  RECOMMENDATIONS FOR ALL PATIENTS WITH MEMORY PROBLEMS: 1. Continue to exercise (Recommend 30 minutes of walking everyday, or 3 hours every week) 2. Increase social interactions - continue going to [place] and enjoy social gatherings with friends and family 3. Eat healthy, avoid fried foods and eat more fruits and vegetables 4. Maintain adequate blood pressure, blood sugar, and blood cholesterol level. Reducing the risk of stroke and cardiovascular disease also helps promoting better memory. 5. Avoid stressful situations. Live a simple life and avoid aggravations. Organize your time and prepare for the next day in anticipation. 6. Sleep well, avoid any interruptions of sleep and avoid any distractions in the bedroom that may interfere with adequate sleep quality 7. Avoid sugar, avoid sweets as there is a strong link between excessive sugar intake, diabetes, and cognitive impairment The Mediterranean diet has been shown to help patients reduce the risk of progressive memory disorders and reduces cardiovascular risk. This includes eating fish, eat fruits and green leafy vegetables, nuts like almonds and hazelnuts, walnuts, and also use olive oil. Avoid fast foods and fried foods as much as possible. Avoid sweets and sugar as sugar use has been linked to worsening of memory function.  There is always a concern of gradual progression of memory problems. If this is the case, then we may need to adjust level of care according to patient needs. Support, both to the patient and caregiver, should then be put into place.

## 2018-06-10 ENCOUNTER — Telehealth: Payer: Self-pay | Admitting: Internal Medicine

## 2018-06-10 NOTE — Telephone Encounter (Signed)
Medical Information sheet from [facility] has been completed and faxed. Copy of info sheet made and placed in scan folder. Copy given to T. Bahr as well. Will route info to T. Zenon as Juluis Rainier. Nothing further needed at this time.

## 2018-06-10 NOTE — Telephone Encounter (Signed)
Called and spoke to Luis [name] at [facility]. She stated only the first page was coming through. Re-faxed the paperwork to the original fax number and an alternate fax number she gave. Nothing further needed at this time.

## 2018-06-12 ENCOUNTER — Other Ambulatory Visit: Payer: Self-pay | Admitting: Nurse Practitioner

## 2018-06-12 ENCOUNTER — Encounter: Payer: Self-pay | Admitting: Internal Medicine

## 2018-06-12 ENCOUNTER — Ambulatory Visit: Payer: Medicare Other | Admitting: Internal Medicine

## 2018-06-12 VITALS — BP 120/60 | HR 94 | Ht 71.0 in | Wt 169.8 lb

## 2018-06-12 DIAGNOSIS — Z23 Encounter for immunization: Secondary | ICD-10-CM | POA: Diagnosis not present

## 2018-06-12 DIAGNOSIS — F0150 Vascular dementia without behavioral disturbance: Secondary | ICD-10-CM | POA: Diagnosis not present

## 2018-06-12 DIAGNOSIS — I639 Cerebral infarction, unspecified: Secondary | ICD-10-CM

## 2018-06-12 DIAGNOSIS — J449 Chronic obstructive pulmonary disease, unspecified: Secondary | ICD-10-CM

## 2018-06-12 MED ORDER — ALBUTEROL SULFATE HFA 108 (90 BASE) MCG/ACT IN AERS: 2.0000 | INHALATION_SPRAY | Freq: Four times a day (QID) | RESPIRATORY_TRACT | 12 refills | Status: AC | PRN

## 2018-06-12 NOTE — Assessment & Plan Note (Signed)
His wife helps with memory issues a little but he is alert, responsive and comfortable.  His wife takes care of medication timing.

## 2018-06-12 NOTE — Progress Notes (Signed)
HPI male former smoker followed for severe COPD/emphysema, vasomotor rhinitis PFT2/5/14- severe obstructive airways disease with slight response to bronchodilator. Air-trapping with increased residual volume. Diffusion mildly reduced. Emphysema pattern. FVC 2.56/59%, FEV1 1.17/41%, FEV1/FVC 0.45. Residual volume 132%, DLCO 78%. 6MWT-11/12/12- 96%, 94%, 96%, 454 m. Good distance with oxygenation well maintained. a1AT- WNL MM 142  6 minute walk test-10/20/15--saturation dropped to 89% on room air by end of walk, 412 m. Borderline but not qualifying for portable O2. Resolving acute bronchitis after doxycycline and Mucinex. Still frequent wheeze. Walk Test on room air 04/18/2016-qualified for portable oxygen with desaturation to 86% and appropriate recovery on 2 L oxygen. Denies cough with meals, eats slowly. --------------------------------------------------------------------------  12/09/17- 13 yo - male former smoker followed for COPD mixed type, vasomotor rhinitis, complicated by CVA/mild cognitive impairment, DM O2 2-3 L/ [facility] ----COPD mixed type: Increased SOB with exertion, wheezing, full feeling in chest(rattles).  Arrival HR 42/minute, BP 124/68 EKG 12/09/17- NSR 59, PACs, NSSTWC Trelegy  No help and not filled. Neb now = Perforomist, not Duoneb. Keeps her chest rattling cough even with regular use of flutter device which does help some, and nebulizer.  Nothing purulent, no blood, fever or chest pain. Continues oxygen 2-3 liters used 24/7.  06/12/2018- 84 yo - male former smoker followed for COPD mixed type, chronic hypoxic respiratory failure,, vasomotor rhinitis, complicated by CVA/vascular dementia, DM, HBP, GERD, O2 2-3 L/ [facility] -----Follows for: COPD, bradycardia Neb Perforomist, Oxygen definitely helps.  Not much cough.  Daily flutter device and Perforomist by nebulizer.  Needs flu shot.  Wife asks about a rescue inhaler to have available. Discussed results of CT scan from the  spring. CT chest 01/09/2018-   IMPRESSION: 1. Emphysema (ICD10-J43.9). Generalized inflammatory or infectious peribronchial thickening appears increased since 2013. There is mild opacification of some segmental bronchi in the right lower lobe related to retained secretions, but no pneumonia or acute parenchymal lung inflammation identified. 2. Aortic Atherosclerosis (ICD10-I70.0). Coronary artery atherosclerosis. 3. Chronic cholelithiasis.  ROS-see HPI  + = positive Constitutional:   No-   weight loss, night sweats, fevers, chills, fatigue, lassitude. HEENT:   No-  headaches, difficulty swallowing, tooth/dental problems, sore throat,       No-  sneezing, itching, ear ache, nasal congestion, post nasal drip,  CV:  No-   chest pain, orthopnea, PND, swelling in lower extremities, anasarca,                                                                           dizziness, palpitations Resp: +shortness of breath with exertion or at rest.             productive cough,  No non-productive cough,  No- coughing up of blood.              No-   change in color of mucus.  + wheezing.   Skin: No-   rash or lesions. GI:  No-   heartburn, indigestion, abdominal pain, nausea, vomiting, GU: . MS:  No-   joint pain or swelling.  . Neuro-     nothing unusual Psych:  No- change in mood or affect. No depression or anxiety.    OBJ- Physical Exam General- Alert,  Oriented, Affect-appropriate, Distress- none acute, trim, +POC O2 L Skin- rash-none, lesions- none, excoriation- none Lymphadenopathy- none Head- atraumatic            Eyes- Gross vision intact, PERRLA, conjunctivae and secretions clear            Ears- Hearing, canals-normal            Nose- Clear, no-Septal dev, mucus, polyps, erosion, perforation             Throat- Mallampati II , mucosa clear , drainage- none, tonsils- atrophic Neck- flexible , trachea midline, no stridor , thyroid nl, carotid no bruit Chest - symmetrical excursion ,  unlabored           Heart/CV- RRR , no murmur , no gallop  , no rub, nl s1 s2                           - JVD- none , edema- none, stasis changes- none, varices- none           Lung-  +diminished, wheeze- none, cough-none, dullness-none, rub- none,                           Chest wall-  Abd-  Br/ Gen/ Rectal- Not done, not indicated Extrem- cyanosis- none, clubbing, none, atrophy- none, strength- nl Neuro- + nonfocal sitting quietly in exam room. Speech seems normal during simple conversation. Wife volunteers a lot of information.

## 2018-06-12 NOTE — Patient Instructions (Signed)
Order- Flu vax senior  Script sent for albuterol rescue inhaler    Use 2 puffs every 4-6 hours, if needed  Please cal if we can help

## 2018-06-12 NOTE — Assessment & Plan Note (Signed)
Stable at this time without acute events. Plan-albuterol rescue inhaler with discussion.  Flu vaccine

## 2018-06-16 ENCOUNTER — Other Ambulatory Visit: Payer: Self-pay | Admitting: Nurse Practitioner

## 2018-06-16 DIAGNOSIS — I639 Cerebral infarction, unspecified: Secondary | ICD-10-CM

## 2018-07-03 ENCOUNTER — Other Ambulatory Visit: Payer: Self-pay | Admitting: Internal Medicine

## 2018-07-03 MED ORDER — COMPRESSOR NEBULIZER MISC: 1.0000 | Freq: Once | 0 refills | Status: AC

## 2018-07-16 ENCOUNTER — Other Ambulatory Visit: Payer: Self-pay | Admitting: Internal Medicine

## 2018-08-18 ENCOUNTER — Other Ambulatory Visit: Payer: Self-pay | Admitting: Nurse Practitioner

## 2018-08-25 IMAGING — DX DG CHEST 2V
2 series · 3 of 3 positions shown · non-contrast
Comparison: 08/16/2017.

CLINICAL DATA: COPD, altered mental status, low oxygenation.

EXAM:
CHEST - 2 VIEW

[chest lat]
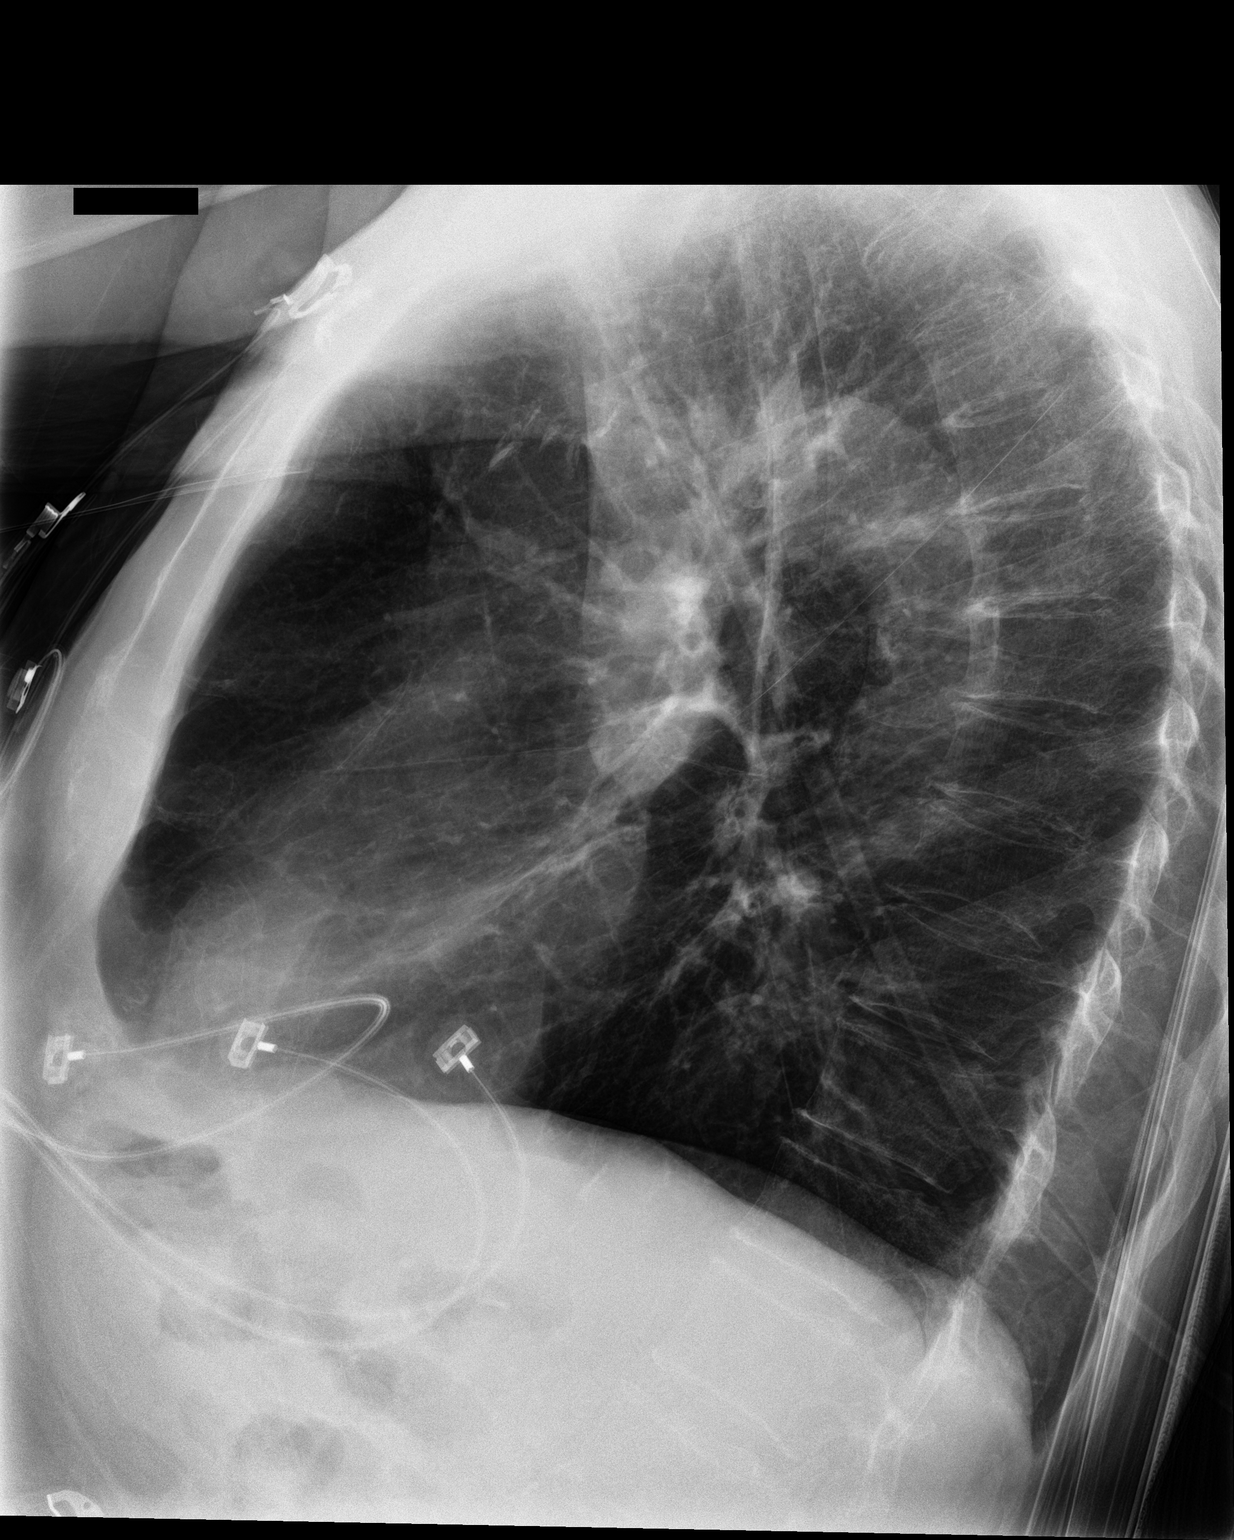

[Series 3: chest ap · 0.14mm/px · 2 of 2 slices shown]
[im 1/2]
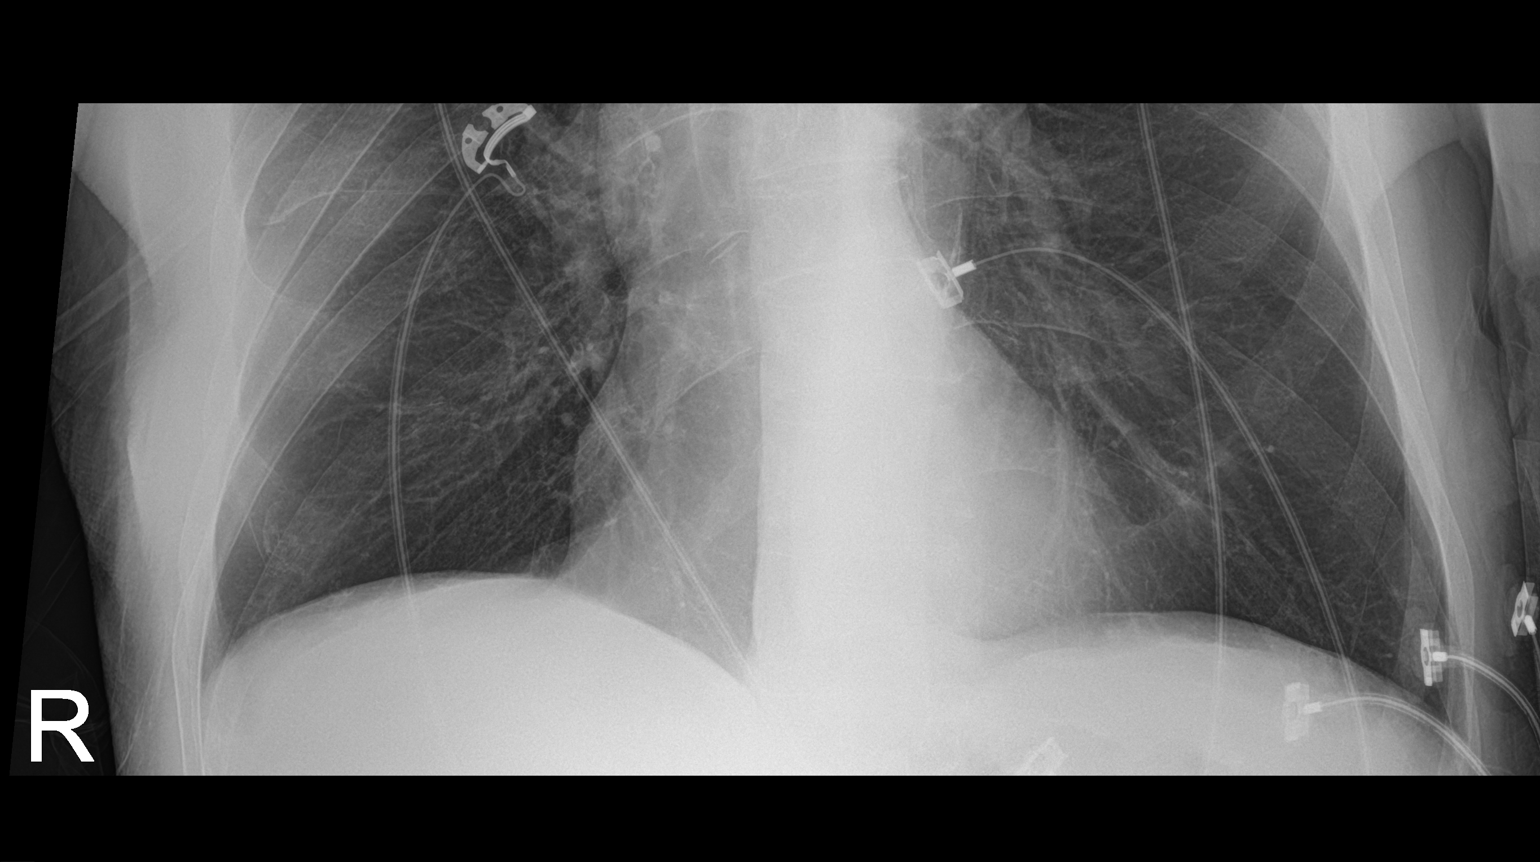
[im 2/2]
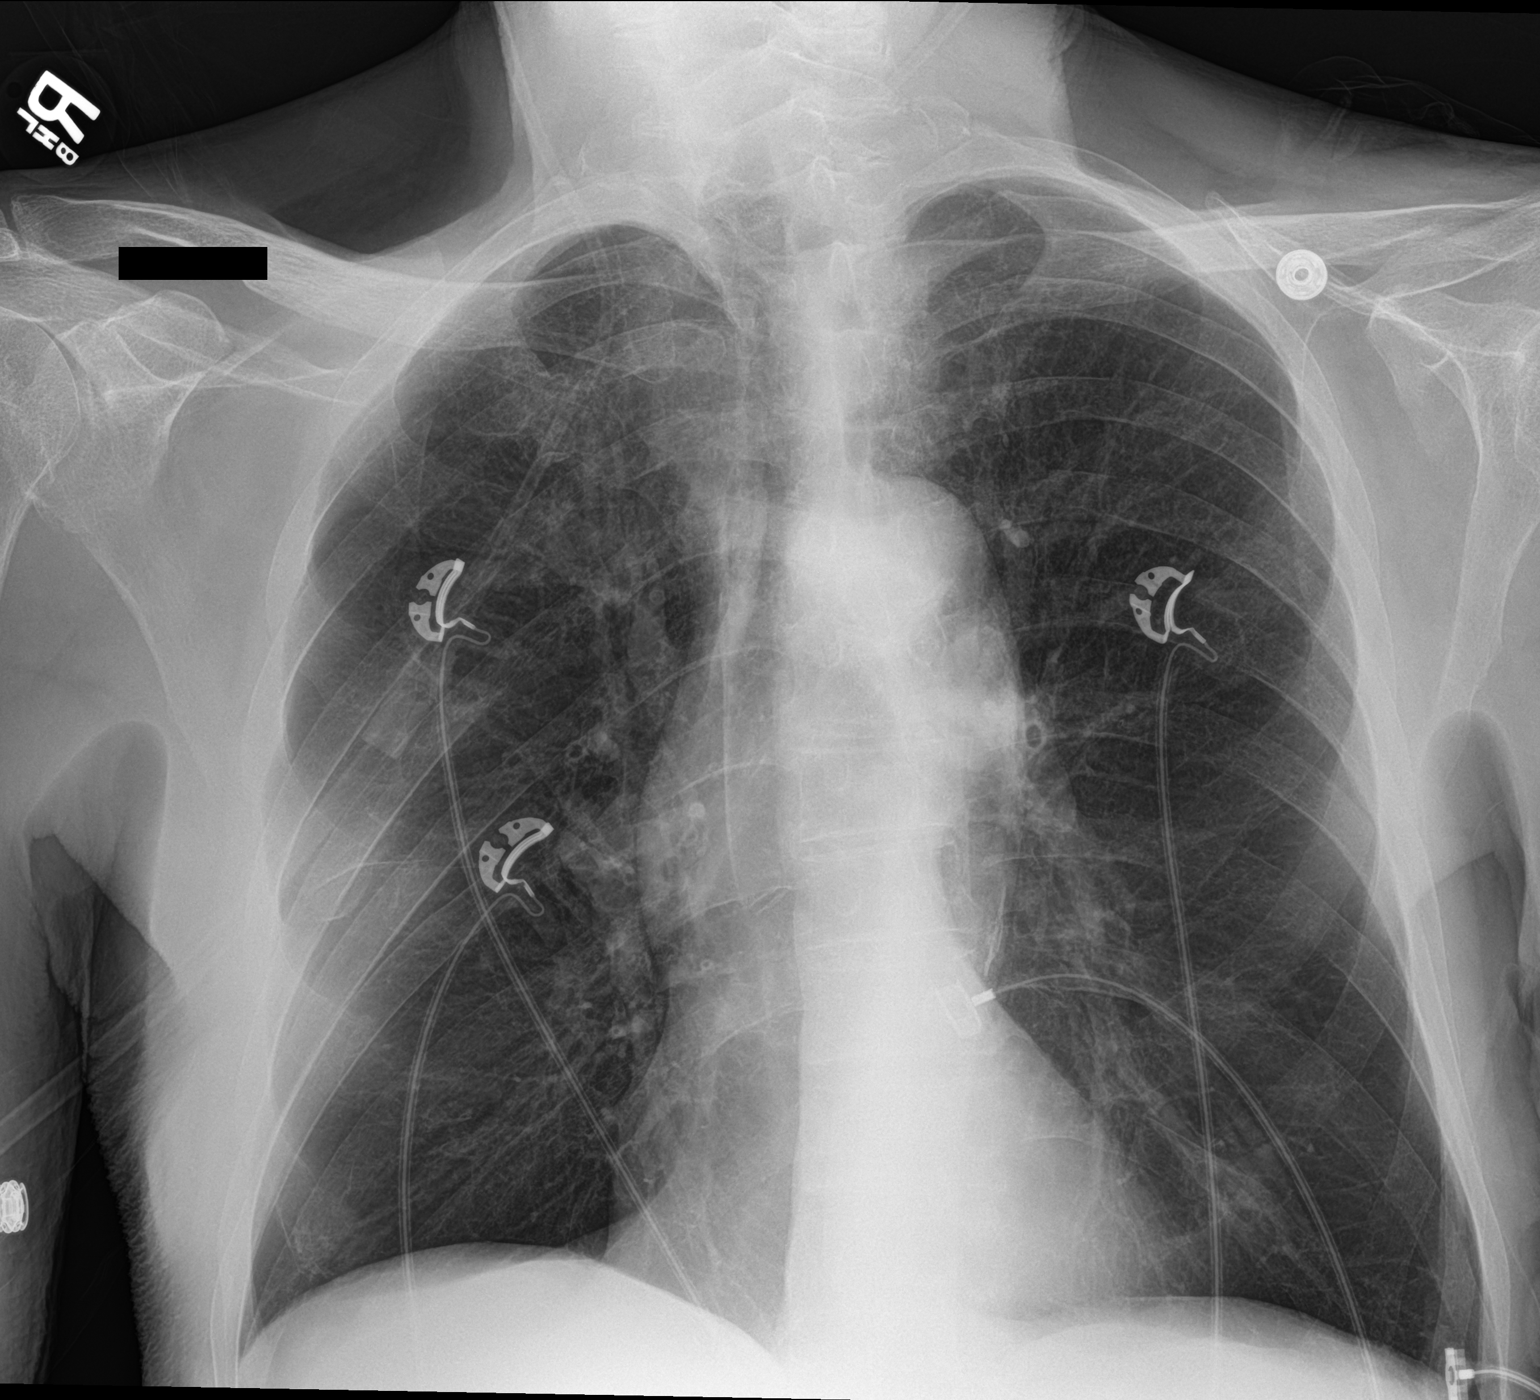

[3 of 3 positions shown; findings below may reference images not displayed]

FINDINGS: Normal heart size. Severe hyperinflation consistent with COPD. No
consolidation or edema. No effusion or pneumothorax. Bones
unremarkable.
IMPRESSION: COPD, no active disease, similar appearance to priors.

## 2018-08-25 IMAGING — MR MR HEAD W/O CM
5 series · 39 of 48 positions shown · non-contrast
Comparison: CT head 01/09/2018.

CLINICAL DATA: Altered mental status.

EXAM:
MRI HEAD WITHOUT CONTRAST
TECHNIQUE: Multiplanar, multiecho pulse sequences of the brain and surrounding
structures were obtained without intravenous contrast.

[Series 2: [id number] · sagittal · 5.0mm · 0.42mm/px · 5 of 20 slices shown]
[im 1/20]
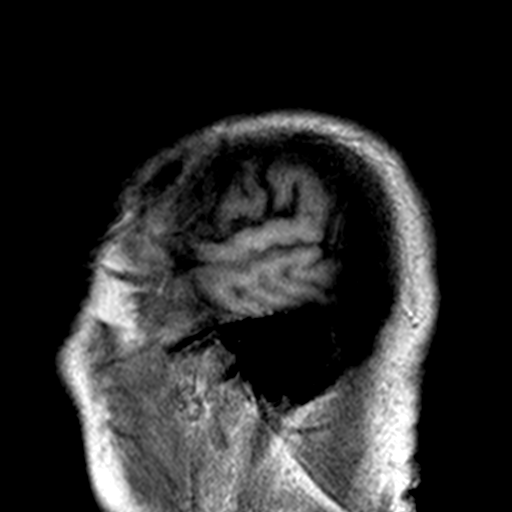
[im 4/20]
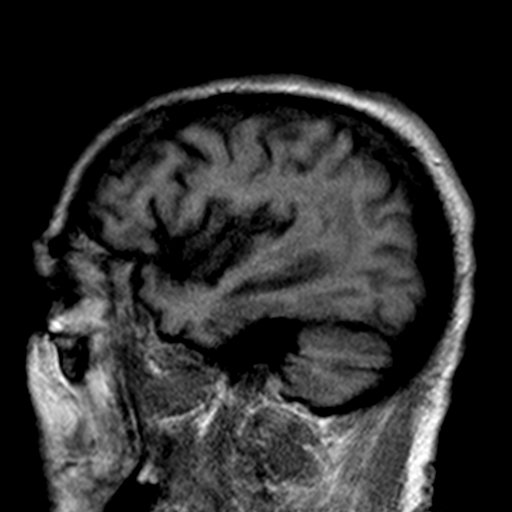
[im 8/20]
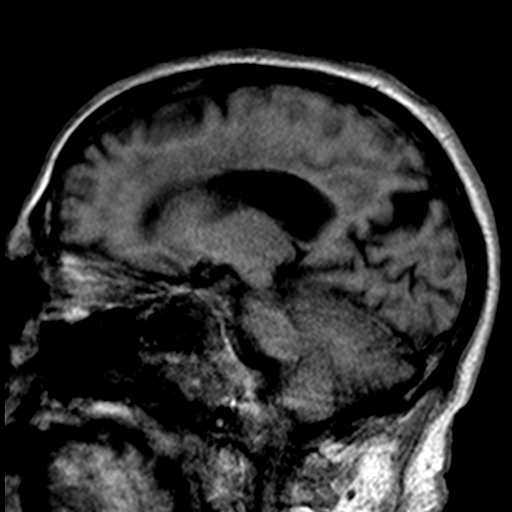
[im 12/20]
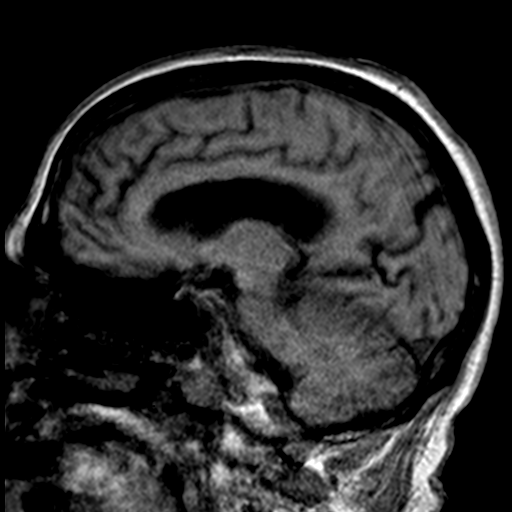
[im 16/20]
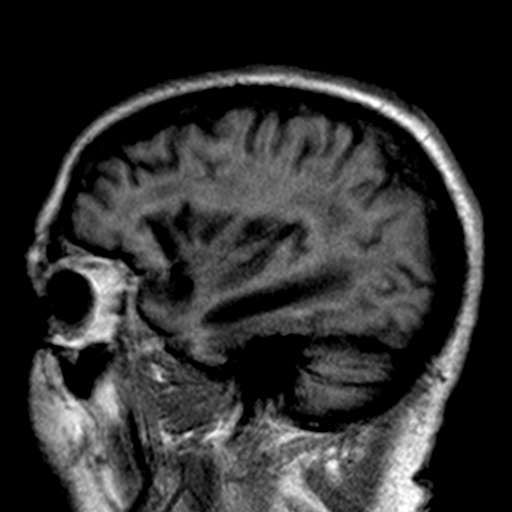

[Series 3: DWI · axial · 3.0mm · 0.82mm/px · z∈[-29,+129]mm · 9 of 55 slices shown (1 of 4)]
[im 1/55]
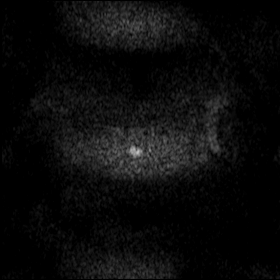
[im 10/55]
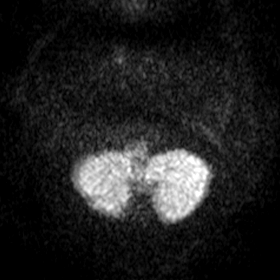
[im 19/55]
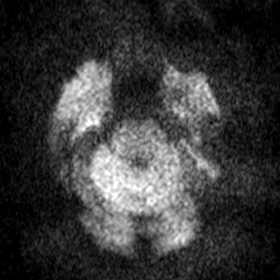
[im 23/55]
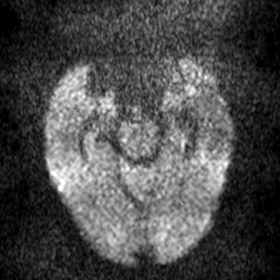
[im 28/55]
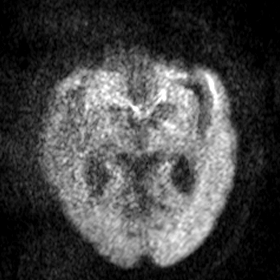
[im 32/55]
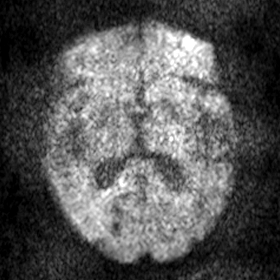
[im 37/55]
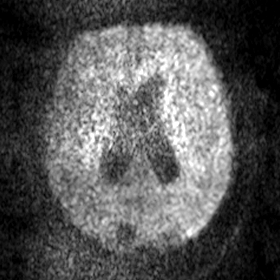
[im 46/55]
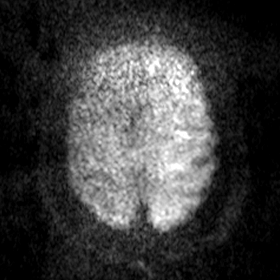
[im 55/55]
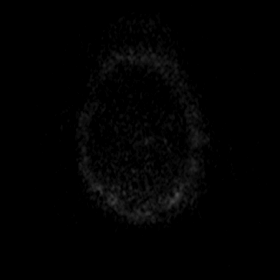

[Series 4: DWI · axial · 3.0mm · 0.75mm/px · z∈[-31,+130]mm · 9 of 55 slices shown (2 of 4)]
[im 1/55]
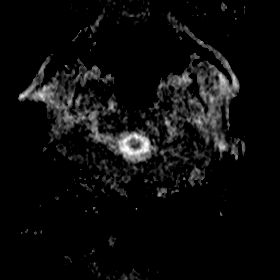
[im 10/55]
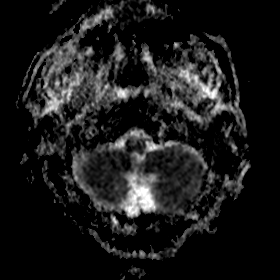
[im 19/55]
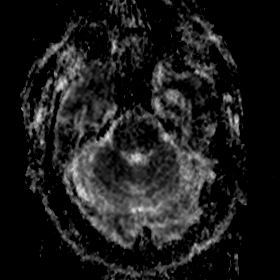
[im 23/55]
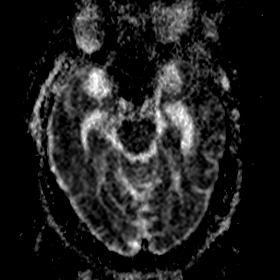
[im 28/55]
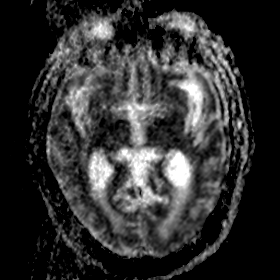
[im 32/55]
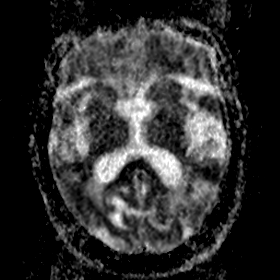
[im 37/55]
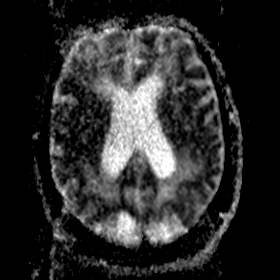
[im 46/55]
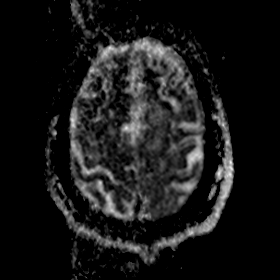
[im 55/55]
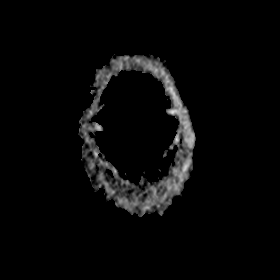

[Series 5: DWI · coronal · 5.0mm · 0.50mm/px · 8 of 34 slices shown (3 of 4)]
[im 1/34]
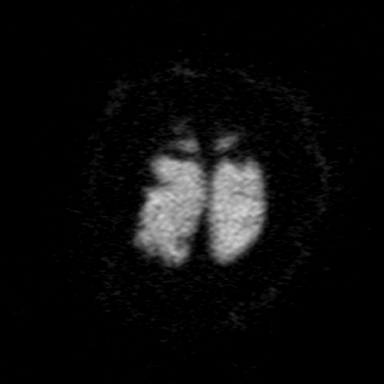
[im 5/34]
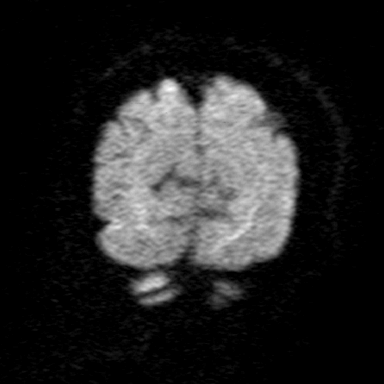
[im 10/34]
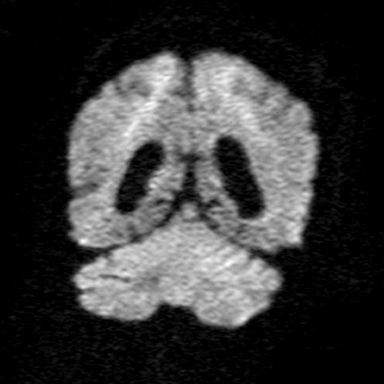
[im 15/34]
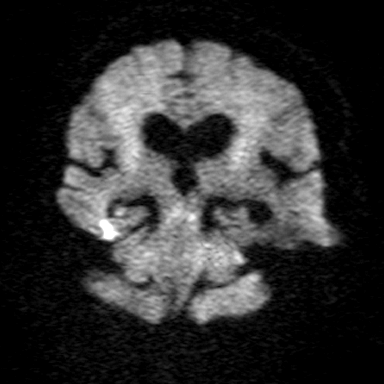
[im 19/34]
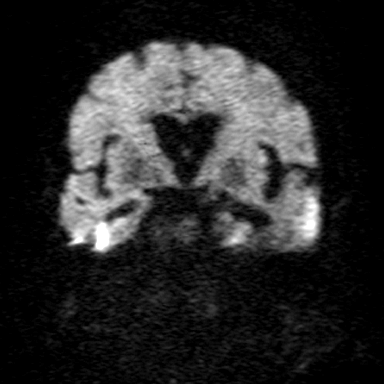
[im 24/34]
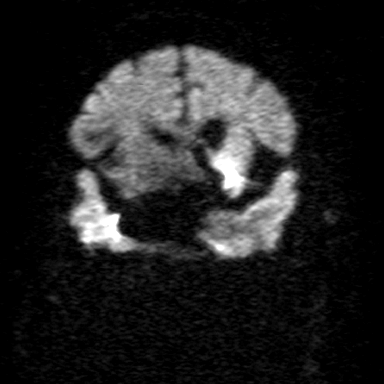
[im 29/34]
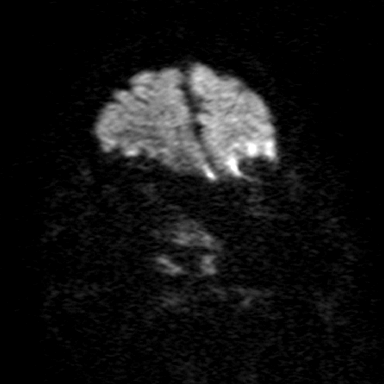
[im 34/34]
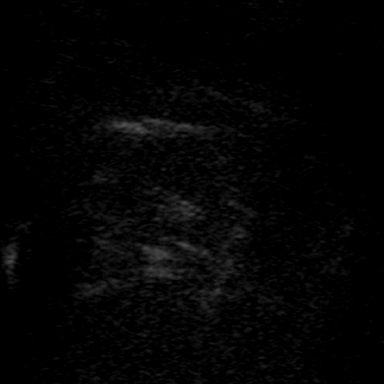

[Series 6: DWI · coronal · 5.0mm · 0.51mm/px · 8 of 34 slices shown (4 of 4)]
[im 1/34]
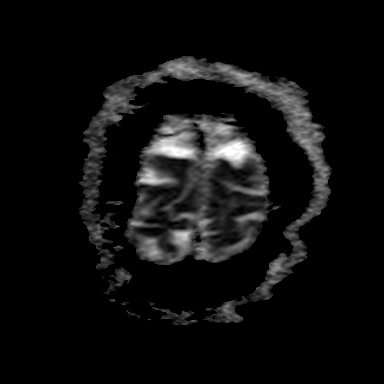
[im 5/34]
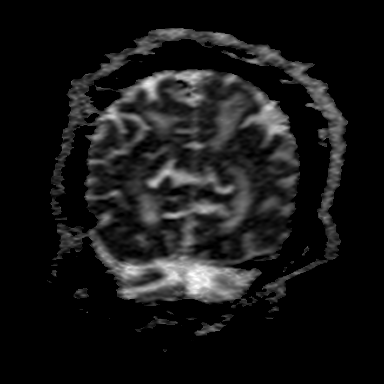
[im 10/34]
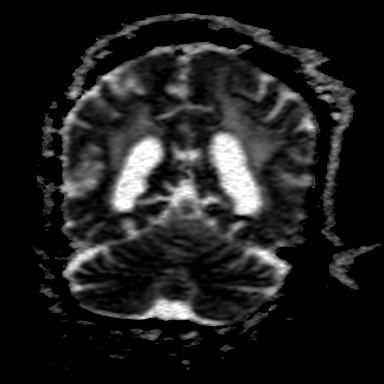
[im 15/34]
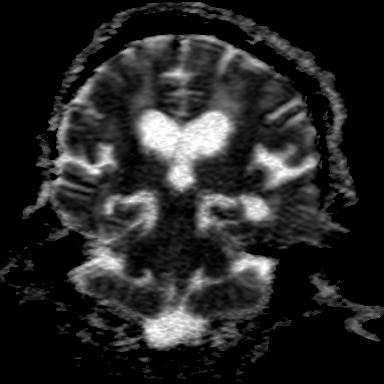
[im 19/34]
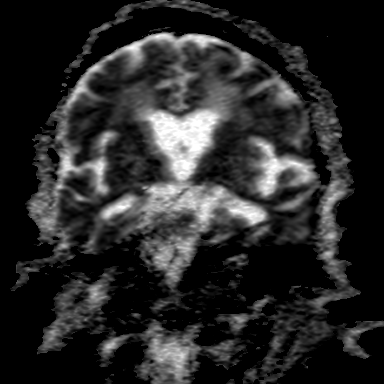
[im 24/34]
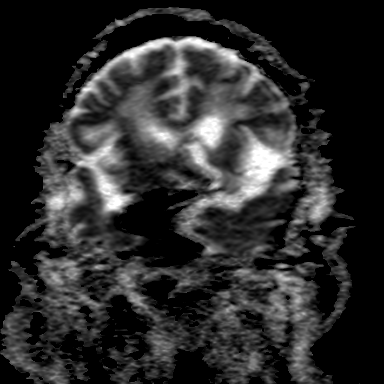
[im 29/34]
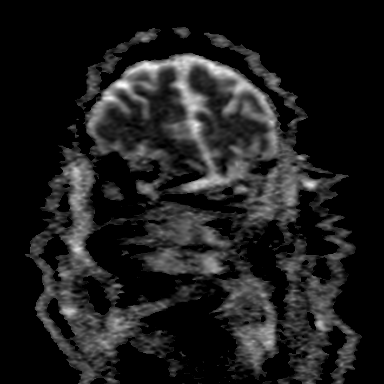
[im 34/34]
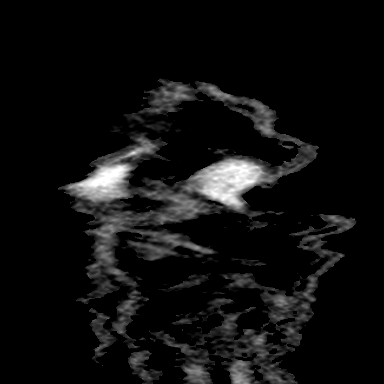

[39 of 48 positions shown; findings below may reference images not displayed]

FINDINGS: The exam was prematurely truncated, due to inability of the patient
to cooperate.

Brain: No restricted diffusion to suggest acute infarction is
observed. No visible mass lesion, hydrocephalus, or restricted
extra-axial fluid.

Vascular: Not assessed.

Skull and upper cervical spine: Grossly normal marrow signal.

Sinuses/Orbits: Not assessed.

Other: None.
IMPRESSION: Prematurely truncated MR examination of brain demonstrates no
visible acute infarction, mass lesion, or other significant finding.

## 2018-08-25 IMAGING — CT CT HEAD W/O CM
3 series · 15 of 47 positions shown, 18 images · non-contrast
Comparison: 11/13/2016

CLINICAL DATA: LEFT anterior chest pain and confusion this morning,
diabetes mellitus, essential hypertension, COPD, prior stroke

EXAM:
CT HEAD WITHOUT CONTRAST
TECHNIQUE: Contiguous axial images were obtained from the base of the skull
through the vertex without intravenous contrast. Sagittal and
coronal MPR images reconstructed from axial data set.

[Series 2: head wo · axial · 0.45mm/px · z∈[+116,+246]mm · 9 of 32 slices shown, 12 images]
[im 3/32  brain]
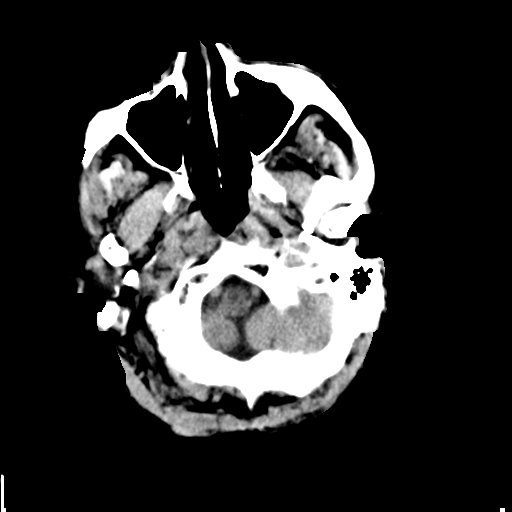
[im 3/32  bone]
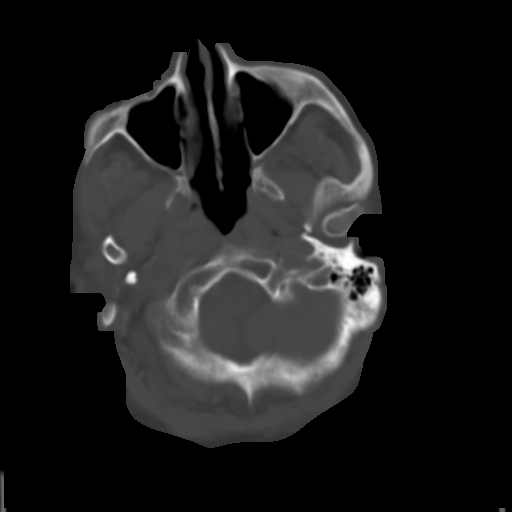
[im 6/32  brain]
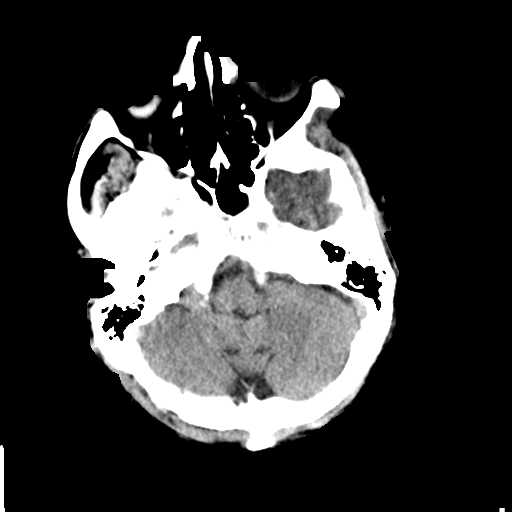
[im 9/32  brain]
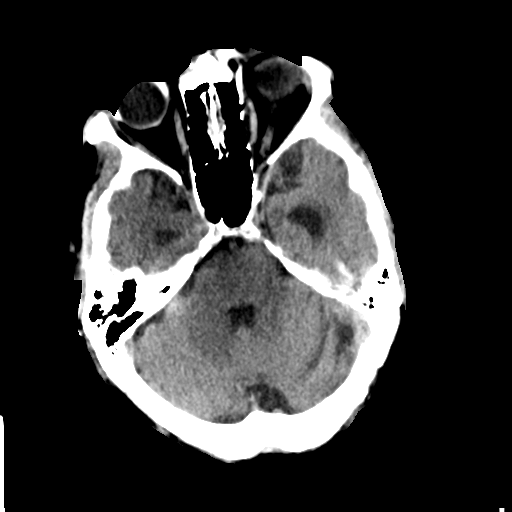
[im 12/32  brain]
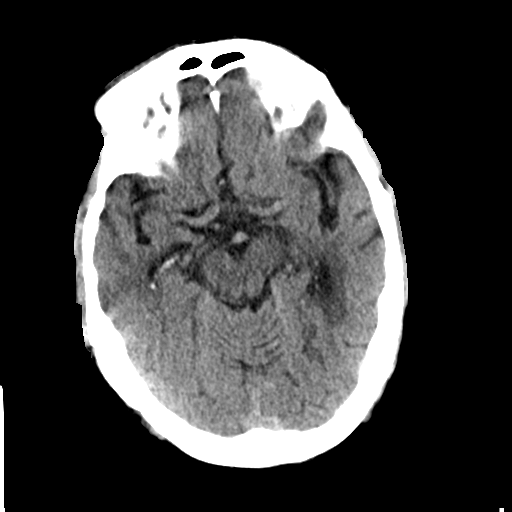
[im 17/32  brain]
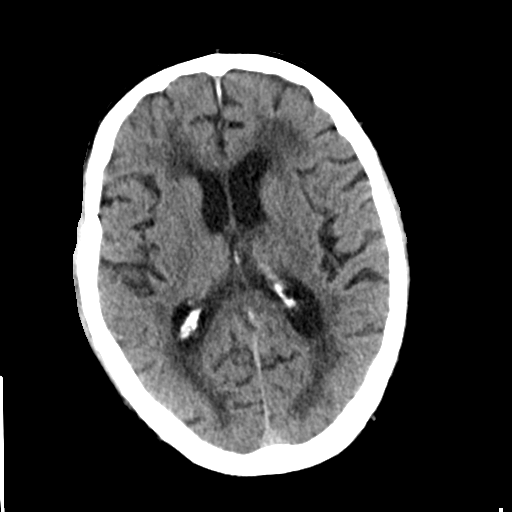
[im 17/32  bone]
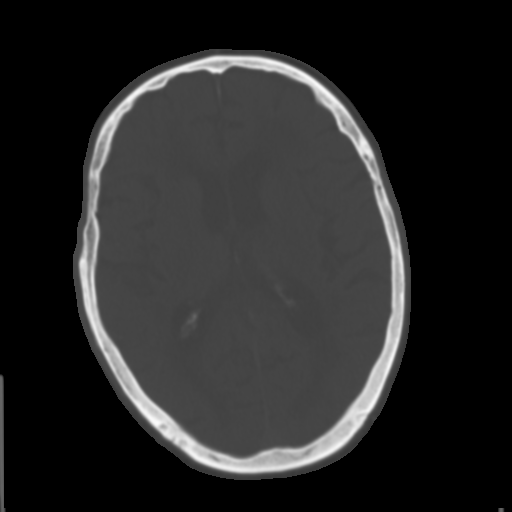
[im 20/32  brain]
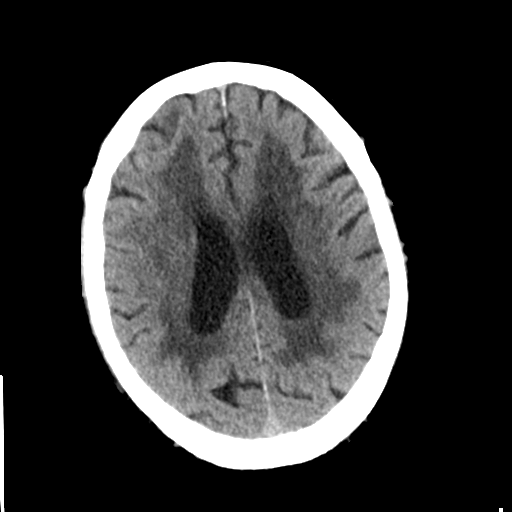
[im 23/32  brain]
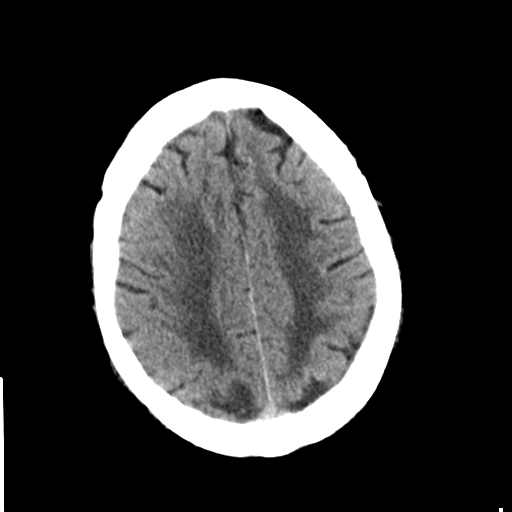
[im 26/32  brain]
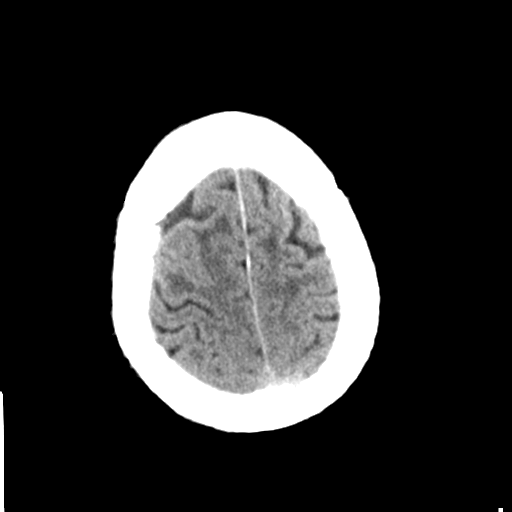
[im 29/32  brain]
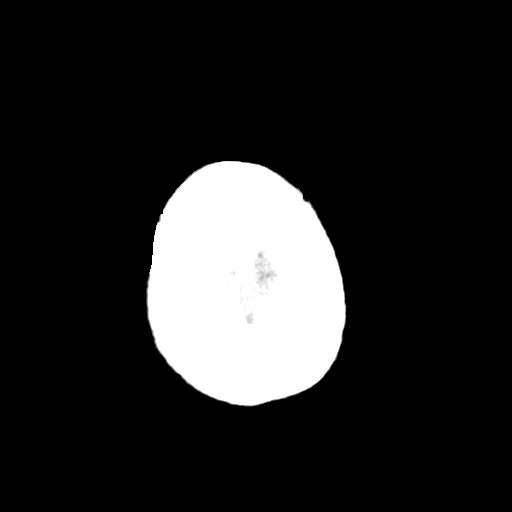
[im 29/32  bone]
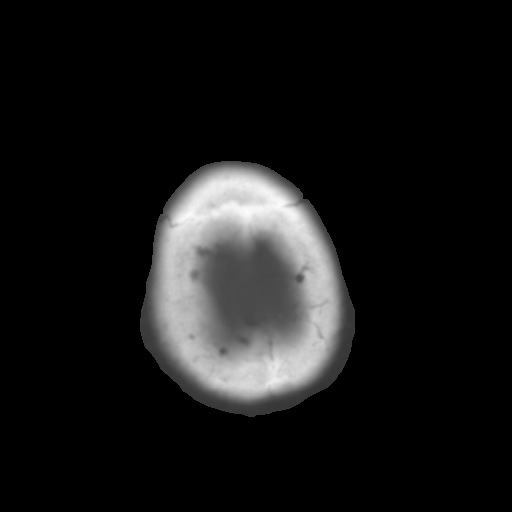

[Series 4: coronal soft tissue · coronal · 0.39mm/px · 3 of 74 slices shown]
[im 25/74  brain]
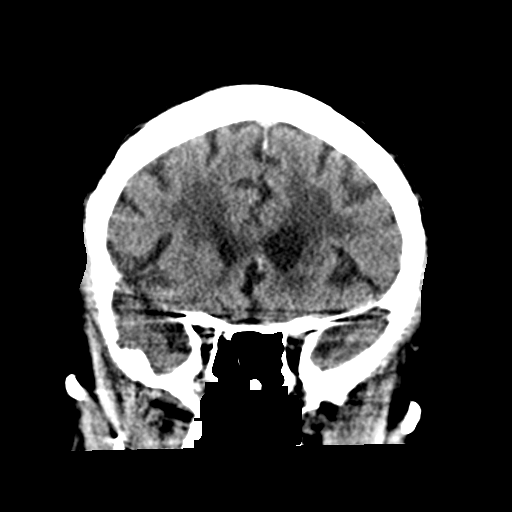
[im 33/74  brain]
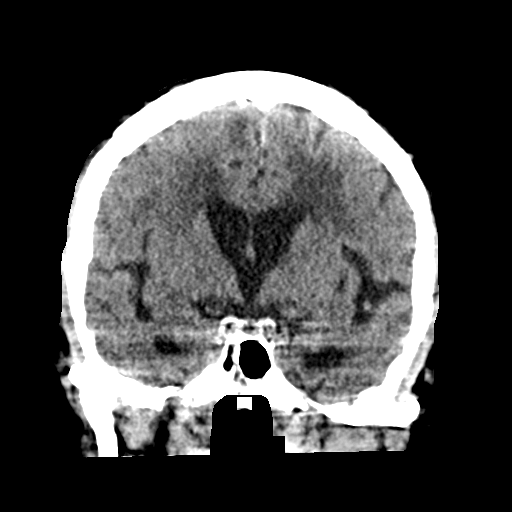
[im 41/74  brain]
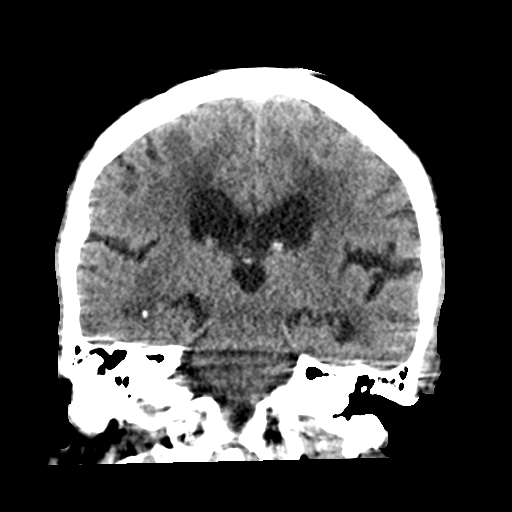

[Series 5: sagittal soft tissue · sagittal · 0.36mm/px · 3 of 67 slices shown]
[im 23/67  brain]
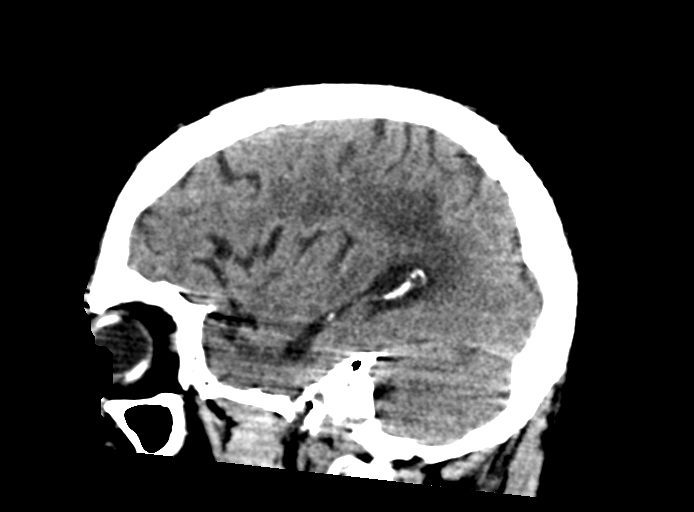
[im 34/67  brain]
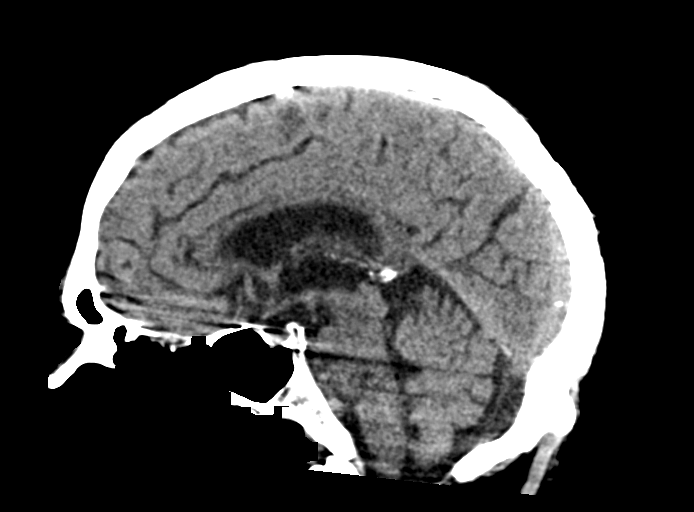
[im 45/67  brain]
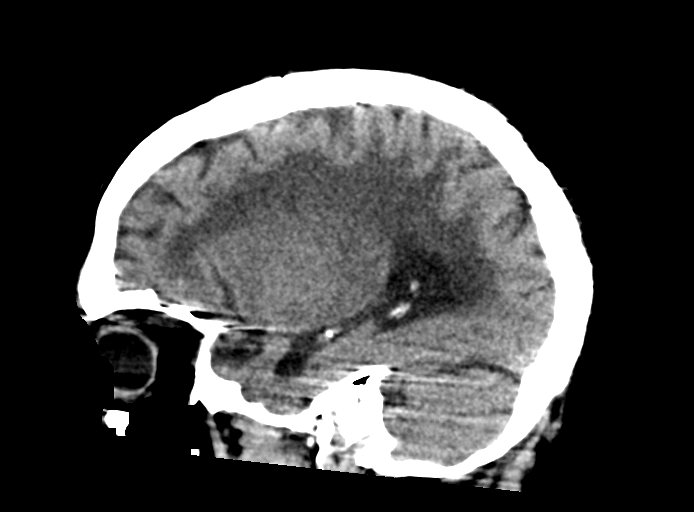

[15 of 47 positions shown; findings below may reference images not displayed]

FINDINGS: Brain: Generalized atrophy. Normal ventricular morphology. No
midline shift or mass effect. Small vessel chronic ischemic changes
of deep cerebral white matter. No intracranial hemorrhage, mass
lesion, evidence of acute infarction, or extra-axial fluid
collection.

Vascular: Mild atherosclerotic calcification of internal carotid
arteries at skull base

Skull: Intact

Sinuses/Orbits: Clear

Other: N/A
IMPRESSION: Atrophy with small vessel chronic ischemic changes of deep cerebral
white matter.

No acute intracranial abnormalities.

## 2018-09-15 ENCOUNTER — Other Ambulatory Visit: Payer: Self-pay | Admitting: Nurse Practitioner

## 2018-10-15 ENCOUNTER — Other Ambulatory Visit: Payer: Self-pay | Admitting: Nurse Practitioner

## 2018-10-15 DIAGNOSIS — I639 Cerebral infarction, unspecified: Secondary | ICD-10-CM

## 2018-11-18 ENCOUNTER — Other Ambulatory Visit: Payer: Self-pay | Admitting: Nurse Practitioner

## 2018-12-11 ENCOUNTER — Other Ambulatory Visit: Payer: Self-pay | Admitting: *Deleted

## 2018-12-11 ENCOUNTER — Ambulatory Visit: Payer: Medicare Other | Admitting: Internal Medicine

## 2018-12-11 DIAGNOSIS — I639 Cerebral infarction, unspecified: Secondary | ICD-10-CM

## 2018-12-11 MED ORDER — CLOPIDOGREL BISULFATE 75 MG PO TABS: 75.0000 mg | ORAL_TABLET | Freq: Every day | ORAL | 0 refills | Status: AC

## 2018-12-15 ENCOUNTER — Other Ambulatory Visit: Payer: Self-pay

## 2018-12-15 ENCOUNTER — Encounter (HOSPITAL_COMMUNITY): Payer: Self-pay | Admitting: Emergency Medicine

## 2018-12-15 ENCOUNTER — Emergency Department (HOSPITAL_COMMUNITY)
Admission: EM | Admit: 2018-12-15 | Discharge: 2018-12-15 | Disposition: A | Discharge status: Home / Self Care | Payer: Medicare Other | Source: Home / Self Care | Attending: Emergency Medicine | Admitting: Emergency Medicine

## 2018-12-15 ENCOUNTER — Emergency Department (HOSPITAL_COMMUNITY): Payer: Medicare Other

## 2018-12-15 DIAGNOSIS — J449 Chronic obstructive pulmonary disease, unspecified: Secondary | ICD-10-CM

## 2018-12-15 DIAGNOSIS — I10 Essential (primary) hypertension: Secondary | ICD-10-CM

## 2018-12-15 DIAGNOSIS — F0150 Vascular dementia without behavioral disturbance: Secondary | ICD-10-CM | POA: Insufficient documentation

## 2018-12-15 DIAGNOSIS — Z8673 Personal history of transient ischemic attack (TIA), and cerebral infarction without residual deficits: Secondary | ICD-10-CM | POA: Insufficient documentation

## 2018-12-15 DIAGNOSIS — Z79899 Other long term (current) drug therapy: Secondary | ICD-10-CM | POA: Insufficient documentation

## 2018-12-15 DIAGNOSIS — E119 Type 2 diabetes mellitus without complications: Secondary | ICD-10-CM | POA: Insufficient documentation

## 2018-12-15 DIAGNOSIS — R040 Epistaxis: Secondary | ICD-10-CM

## 2018-12-15 DIAGNOSIS — Z7982 Long term (current) use of aspirin: Secondary | ICD-10-CM | POA: Insufficient documentation

## 2018-12-15 DIAGNOSIS — Z7902 Long term (current) use of antithrombotics/antiplatelets: Secondary | ICD-10-CM | POA: Insufficient documentation

## 2018-12-15 DIAGNOSIS — Z87891 Personal history of nicotine dependence: Secondary | ICD-10-CM | POA: Insufficient documentation

## 2018-12-15 LAB — CBC WITH DIFFERENTIAL/PLATELET
Abs Immature Granulocytes: 0.06 10*3/uL (ref 0.00–0.07)
Basophils Absolute: 0 10*3/uL (ref 0.0–0.1)
Basophils Relative: 0 %
Eosinophils Absolute: 0 10*3/uL (ref 0.0–0.5)
Eosinophils Relative: 0 %
HCT: 39.6 % (ref 39.0–52.0)
Hemoglobin: 12.6 g/dL — ABNORMAL LOW (ref 13.0–17.0)
Immature Granulocytes: 0 %
Lymphocytes Relative: 2 %
Lymphs Abs: 0.3 10*3/uL — ABNORMAL LOW (ref 0.7–4.0)
MCH: 30.4 pg (ref 26.0–34.0)
MCHC: 31.8 g/dL (ref 30.0–36.0)
MCV: 95.4 fL (ref 80.0–100.0)
MONO ABS: 1.2 10*3/uL — AB (ref 0.1–1.0)
Monocytes Relative: 8 %
Neutro Abs: 13.6 10*3/uL — ABNORMAL HIGH (ref 1.7–7.7)
Neutrophils Relative %: 90 %
Platelets: 232 10*3/uL (ref 150–400)
RBC: 4.15 MIL/uL — AB (ref 4.22–5.81)
RDW: 13.3 % (ref 11.5–15.5)
WBC: 15.1 10*3/uL — ABNORMAL HIGH (ref 4.0–10.5)
nRBC: 0 % (ref 0.0–0.2)

## 2018-12-15 LAB — COMPREHENSIVE METABOLIC PANEL
ALT: 13 U/L (ref 0–44)
AST: 16 U/L (ref 15–41)
Albumin: 3.5 g/dL (ref 3.5–5.0)
Alkaline Phosphatase: 72 U/L (ref 38–126)
Anion gap: 10 (ref 5–15)
BUN: 18 mg/dL (ref 8–23)
CO2: 36 mmol/L — ABNORMAL HIGH (ref 22–32)
Calcium: 9.1 mg/dL (ref 8.9–10.3)
Chloride: 91 mmol/L — ABNORMAL LOW (ref 98–111)
Creatinine, Ser: 0.64 mg/dL (ref 0.61–1.24)
GFR calc Af Amer: >60 mL/min (ref >60)
GFR calc non Af Amer: >60 mL/min (ref >60)
Glucose, Bld: 140 mg/dL — ABNORMAL HIGH (ref 70–99)
POTASSIUM: 3.5 mmol/L (ref 3.5–5.1)
Sodium: 137 mmol/L (ref 135–145)
Total Bilirubin: 0.9 mg/dL (ref 0.3–1.2)
Total Protein: 6.7 g/dL (ref 6.5–8.1)

## 2018-12-15 LAB — INFLUENZA PANEL BY PCR (TYPE A & B)
Influenza A By PCR: NEGATIVE
Influenza B By PCR: NEGATIVE

## 2018-12-15 MED ORDER — LIDOCAINE HCL (PF) 1 % IJ SOLN
INTRAMUSCULAR | Status: AC
  Filled 2018-12-15: qty 10

## 2018-12-15 MED ORDER — PREDNISONE 10 MG PO TABS: 40.0000 mg | ORAL_TABLET | Freq: Every day | ORAL | 0 refills | Status: DC

## 2018-12-15 MED ORDER — DOXYCYCLINE HYCLATE 100 MG PO CAPS: 100.0000 mg | ORAL_CAPSULE | Freq: Two times a day (BID) | ORAL | 0 refills | Status: DC

## 2018-12-15 MED ORDER — SILVER NITRATE-POT NITRATE 75-25 % EX MISC
CUTANEOUS | Status: AC
  Filled 2018-12-15: qty 1

## 2018-12-15 MED ORDER — ALBUTEROL (5 MG/ML) CONTINUOUS INHALATION SOLN: 5.0000 mg/h | INHALATION_SOLUTION | Freq: Once | RESPIRATORY_TRACT | Status: DC

## 2018-12-15 MED ORDER — OXYMETAZOLINE HCL 0.05 % NA SOLN
1.0000 | Freq: Once | NASAL | Status: AC
  Administered 2018-12-15: 1 via NASAL

## 2018-12-15 MED ORDER — LIDOCAINE-EPINEPHRINE (PF) 1 %-1:200000 IJ SOLN
INTRAMUSCULAR | Status: AC
  Administered 2018-12-15: 30 mL
  Filled 2018-12-15: qty 30

## 2018-12-15 MED ORDER — OXYMETAZOLINE HCL 0.05 % NA SOLN
NASAL | Status: AC
  Administered 2018-12-15: 1 via NASAL
  Filled 2018-12-15: qty 30

## 2018-12-15 MED ORDER — SILVER NITRATE-POT NITRATE 75-25 % EX MISC
CUTANEOUS | Status: AC
  Filled 2018-12-15: qty 3

## 2018-12-15 MED ORDER — ALBUTEROL SULFATE (2.5 MG/3ML) 0.083% IN NEBU
2.5000 mg | INHALATION_SOLUTION | Freq: Once | RESPIRATORY_TRACT | Status: AC
  Administered 2018-12-15: 2.5 mg via RESPIRATORY_TRACT
  Filled 2018-12-15: qty 3

## 2018-12-15 MED ORDER — LIDOCAINE-EPINEPHRINE (PF) 1 %-1:200000 IJ SOLN
30.0000 mL | Freq: Once | INTRAMUSCULAR | Status: AC
  Administered 2018-12-15: 30 mL

## 2018-12-15 NOTE — ED Triage Notes (Signed)
Pt states his nose started bleeding spontaneously earlier today.  Pt is on home oxygen and is anticoagulated.  Right nare steady ooze.

## 2018-12-15 NOTE — ED Notes (Signed)
No active bleeding noted 

## 2018-12-15 NOTE — ED Provider Notes (Signed)
[facility] EMERGENCY DEPARTMENT Provider Note   CSN: [id number] Arrival date & time: 12/15/18  1610    History   Chief Complaint Chief Complaint  Patient presents with  . Epistaxis    HPI AC COLAN is a 85 y.o. male.     Pt presents to the ED along with wife complaining of a sudden onset episode of epistaxis to the right naris that occurred about 1.5 hours PTA. Pt is unsure what he was doing at the time; wife was in the house but not with patient. Pt is on Plavix; no recent change in medication. They attempted to apply pressure to the nose and packed it with toilet paper without relief; prompting wife to bring pt in today. She reports he had mild difficulty walking to the ambulance due to generalized weakness that he has been complaining of for the past 2-3 days. Wife also states pt has had a cough. She recently increased his at home O2 from 2 to 3 L. Pt used his breathing machine last night without relief from cough. No recent sick contacts. Denies fever, chills, chest pain, abdominal pain, nausea, vomiting, muscle aches, or any other associated symptoms.      Past Medical History:  Diagnosis Date  . Arthritis   . COPD, severe ([place])   . Dysphagia   . GERD (gastroesophageal reflux disease)   . Heart murmur    In childhood  . History of stroke   . Hyperlipidemia   . Hypertension   . Type 2 diabetes mellitus ([place])   . Vascular dementia [facility])     Patient Active Problem List   Diagnosis Date Noted  . Medicare annual wellness visit, subsequent 12/19/2016  . Encounter for general adult medical examination with abnormal findings 12/19/2016  . Vascular dementia without behavioral disturbance ([place]) 11/21/2016  . TIA (transient ischemic attack) 11/12/2016  . Urinary frequency 08/01/2016  . Hypokalemia 08/01/2016  . History of stroke 12/29/2015  . GI bleed 12/19/2015  . Mild cognitive impairment 07/26/2015  . Hyperlipidemia 07/26/2015  . Acute encephalopathy   .  Altered mental status 07/15/2015  . COPD exacerbation ([place]) 07/15/2015  . Hyponatremia 07/15/2015  . Essential hypertension 07/15/2015  . CVA (cerebral vascular accident) ([place]) 07/15/2015  . Chronic respiratory failure with hypoxia ([place]) 07/15/2015  . Diabetes mellitus ([place]) 07/15/2015  . Bilateral inguinal hernia 03/04/2013  . Vasomotor rhinitis 11/29/2012  . COPD mixed type ([place]) 11/08/2012    Past Surgical History:  Procedure Laterality Date  . CATARACT EXTRACTION W/ INTRAOCULAR LENS IMPLANT Left 2011  . COLONOSCOPY N/A 01/03/2016   Procedure: COLONOSCOPY;  Surgeon: Aviva Signs, MD;  [facility];  Service: Gastroenterology;  Laterality: N/A;  . ESOPHAGEAL DILATION  2000's   x2  . EYE SURGERY    . INGUINAL HERNIA REPAIR Right 1970's  . INGUINAL HERNIA REPAIR Bilateral 03/12/2013   w/mesh bilaterally/notes 03/12/2013  . INGUINAL HERNIA REPAIR Bilateral 03/12/2013   Procedure: HERNIA REPAIR INGUINAL ADULT BILATERAL;  Surgeon: Merrie Roof, MD;  Location: [place];  Service: General;  Laterality: Bilateral;  . INSERTION OF MESH Bilateral 03/12/2013   Procedure: INSERTION OF MESH;  Surgeon: Merrie Roof, MD;  Location: [place];  Service: General;  Laterality: Bilateral;  . RETINAL DETACHMENT SURGERY Right 08/29/2010  . TONSILLECTOMY  1940's  . TRANSURETHRAL RESECTION OF PROSTATE  2002; 2013        Home Medications    Prior to Admission medications   Medication Sig  Start Date End Date Taking? Authorizing Provider  albuterol (PROVENTIL HFA;VENTOLIN HFA) 108 (90 Base) MCG/ACT inhaler Inhale 2 puffs into the lungs every 6 (six) hours as needed for wheezing or shortness of breath. 06/12/18  Yes Young, Tarri Fuller D, MD  amLODipine (NORVASC) 2.5 MG tablet TAKE ONE TABLET BY MOUTH DAILY Patient taking differently: Take 2.5 mg by mouth every morning.  11/18/18  Yes Lance Sell, NP  aspirin EC 325 MG EC tablet Take 1 tablet (325 mg total) by mouth daily. Patient taking  differently: Take 325 mg by mouth every evening.  11/14/16  Yes Nita Sells, MD  atorvastatin (LIPITOR) 80 MG tablet TAKE ONE TABLET BY MOUTH DAILY. Patient taking differently: Take 80 mg by mouth every evening.  10/15/18  Yes Lance Sell, NP  Cholecalciferol (D3 MAXIMUM STRENGTH) 5000 UNITS capsule Take 5,000 Units by mouth every morning.    Yes [provider]  clopidogrel (PLAVIX) 75 MG tablet Take 1 tablet (75 mg total) by mouth daily. Patient taking differently: Take 75 mg by mouth every morning.  12/11/18  Yes Lance Sell, NP  divalproex (DEPAKOTE ER) 250 MG 24 hr tablet Take 1 tablet (250 mg total) by mouth daily. Patient taking differently: Take 250 mg by mouth every evening.  05/20/18  Yes Cameron Sprang, MD  donepezil (ARICEPT) 10 MG tablet Take 1 tablet daily Patient taking differently: Take 10 mg by mouth every morning.  05/20/18  Yes Cameron Sprang, MD  escitalopram (LEXAPRO) 5 MG tablet Take 1 tablet (5 mg total) by mouth daily. Patient taking differently: Take 5 mg by mouth every morning.  05/20/18  Yes Cameron Sprang, MD  nitroGLYCERIN (NITROSTAT) 0.4 MG SL tablet Place 0.4 mg under the tongue every 5 (five) minutes as needed for chest pain. Reported on 03/19/2016   Yes [provider]  OXYGEN Inhale 3 L into the lungs continuous. On 3 liters during the day and at night   Yes [provider]  PERFOROMIST 20 MCG/2ML nebulizer solution INHALE CONTENTS OF 1 VIAL IN NEBULIZER TWICE DAILY. Patient taking differently: Take 20 mcg by nebulization 2 (two) times daily.  07/16/18  Yes Deneise Lever, MD  Respiratory Therapy Supplies (FLUTTER) DEVI Blow through as directed 12/06/16  Yes Young, Tarri Fuller D, MD  tamsulosin (FLOMAX) 0.4 MG CAPS capsule TAKE ONE CAPSULE BY MOUTH DAILY. Patient taking differently: Take 0.4 mg by mouth every evening.  09/15/18  Yes Lance Sell, NP  doxycycline (VIBRAMYCIN) 100 MG capsule Take 1 capsule (100 mg  total) by mouth 2 (two) times daily. 12/15/18   Alroy Bailiff, Zacary Bauer, PA-C  predniSONE (DELTASONE) 10 MG tablet Take 4 tablets (40 mg total) by mouth daily for 5 days. 12/15/18 12/20/18  Eustaquio Maize, PA-C    Family History Family History  Problem Relation Age of Onset  . Stroke Mother   . Healthy Father     Social History Social History   Tobacco Use  . Smoking status: Former Smoker    Packs/day: 2.00    Years: 35.00    Pack years: 70.00    Types: Cigarettes    Last attempt to quit: 10/08/1985    Years since quitting: 33.2  . Smokeless tobacco: Former Systems developer    Types: [place] date: 10/08/1997  Substance Use Topics  . Alcohol use: No    Alcohol/week: 2.0 standard drinks    Types: 2 Standard drinks or equivalent per week  . Drug use: No  Allergies   Tranexamic acid   Review of Systems Review of Systems  Constitutional: Positive for fatigue. Negative for chills and fever.  HENT: Positive for nosebleeds.   Eyes: Negative for visual disturbance.  Respiratory: Positive for cough and shortness of breath.   Cardiovascular: Negative for chest pain.  Gastrointestinal: Negative for abdominal pain, nausea and vomiting.  Musculoskeletal: Negative for myalgias.  Skin: Negative for rash.  Neurological: Positive for weakness (generalized). Negative for dizziness and light-headedness.  Hematological: Bruises/bleeds easily (on plavix).     Physical Exam Updated Vital Signs BP (!) 152/71 (BP Location: Right Arm)   Pulse 71   Resp 17   Ht 6' (1.829 m)   Wt 77 kg   SpO2 99%   BMI 23.02 kg/m   Physical Exam Vitals signs and nursing note reviewed.  Constitutional:      Appearance: He is not ill-appearing.  HENT:     Head: Normocephalic and atraumatic.     Right Ear: Tympanic membrane normal.     Left Ear: Tympanic membrane normal.     Nose:     Comments: Dried blood to right naris; able to visualized small area of bleeding to septum, unsure if this is source of bleeding.  Left naris clear.     Mouth/Throat:     Comments: Blood to posterior oropharynx from naris.  Eyes:     Conjunctiva/sclera: Conjunctivae normal.  Neck:     Musculoskeletal: Neck supple.  Cardiovascular:     Rate and Rhythm: Normal rate and regular rhythm.     Pulses: Normal pulses.     Comments: 2+ radial pulses Pulmonary:     Effort: Pulmonary effort is normal. No respiratory distress.     Breath sounds: Wheezing present. No rhonchi.  Abdominal:     Palpations: Abdomen is soft.     Tenderness: There is no abdominal tenderness.  Skin:    General: Skin is warm and dry.  Neurological:     Mental Status: He is alert.      ED Treatments / Results  Labs (all labs ordered are listed, but only abnormal results are displayed) Labs Reviewed  COMPREHENSIVE METABOLIC PANEL - Abnormal; Notable for the following components:      Result Value   Chloride 91 (*)    CO2 36 (*)    Glucose, Bld 140 (*)    All other components within normal limits  CBC WITH DIFFERENTIAL/PLATELET - Abnormal; Notable for the following components:   WBC 15.1 (*)    RBC 4.15 (*)    Hemoglobin 12.6 (*)    Neutro Abs 13.6 (*)    Lymphs Abs 0.3 (*)    Monocytes Absolute 1.2 (*)    All other components within normal limits  INFLUENZA PANEL BY PCR (TYPE A & B)    EKG EKG Interpretation  Date/Time:  Monday December 15 2018 17:03:56 EDT Ventricular Rate:  69 PR Interval:    QRS Duration: 87 QT Interval:  386 QTC Calculation: 414 R Axis:   30 Text Interpretation:  Sinus rhythm Atrial premature complexes since last tracing no significant change Confirmed by Noemi Chapel [phone]) on 12/15/2018 8:50:56 PM   Radiology Dg Chest Port 1 View  Result Date: 12/15/2018 CLINICAL DATA:  Cough.  Nose bleed today. EXAM: PORTABLE CHEST 1 VIEW COMPARISON:  01/09/2018 FINDINGS: Heart size is normal. There is ordinary aortic atherosclerosis. The pulmonary vascularity is normal. The lungs are clear. No effusions. No significant  bone finding. IMPRESSION: No active disease.  Electronically Signed   By: Nelson Chimes M.D.   On: 12/15/2018 17:31    Procedures Procedures (including critical care time)  Medications Ordered in ED Medications  oxymetazoline (AFRIN) 0.05 % nasal spray 1 spray (1 spray Each Nare Given 12/15/18 1656)  lidocaine-EPINEPHrine (XYLOCAINE-EPINEPHrine) 1 %-1:200000 (PF) injection 30 mL (30 mLs Other Given 12/15/18 1656)  albuterol (PROVENTIL) (2.5 MG/3ML) 0.083% nebulizer solution 2.5 mg (2.5 mg Nebulization Given 12/15/18 2122)     Initial Impression / Assessment and Plan / ED Course  I have reviewed the triage vital signs and the nursing notes.  Pertinent labs & imaging results that were available during my care of the patient were reviewed by me and considered in my medical decision making (see chart for details).    Presents with epistaxis > 1 hour since onset. Pt is anticoagulated with Plavix; no recent change in dosage. Wife also reports pt feeling more weak than normal the last few days with a cough; has hx of COPD. O2 sats while examining patient dropped to 83; placed nasal cannula in mouth due to epistaxis; increased to 96%. Dr. Sabra Heck at bedside with me; bleed appears to be anterior at the moment; packed right naris with cotton balls soaked in lidocaine with epi. Will reevaluate pt in 30 minutes to determine if more packing is required. PNA rule out initiated as well as flu swab given pt's recent complaints of generalized weakness and cough.   5:50 PM Upon recheck pt states he is feeling much better; does not endorse sensation of blood dripping down the back of his throat; no blood throw nasal packing. Able to visualize small amount of blood to posterior oropharynx; will consult with Dr. Sabra Heck regarding same and need for further management of epistaxis. CBC with leukocytosis of 15.1; CXR clear of acute abnormalities; still awaiting flu panel and CMP.   6:33 PM Flu negative. CMP unremarkable.    7:23 PM Updated patient on results during reevaluation; do not appreciate blood in the posterior oropharynx at this time. Small amount of blood has leaked through the nasal packing; will have Dr. Sabra Heck reevaluate pt as well prior to taking out packing.   8:30 PM Dr. Sabra Heck and I reevaluated pt; removed packing; did not observe any active bleeding although applied silver nitrate to area that was previously bleeding for good measure. Will discharge patient home with Rx Doxycycline x 10 days to prevent infection as well as prednisone 40 mg x 5 days for wheezing;  5 mg albuterol neb treatment given prior to discharge. Will have pt follow up with PCP regarding same; advised to return to the ED for any continued bleeding, fever, chills, nausea, vomiting, shortness of breath. Pt in agreement with plan and stable for discharge.         Final Clinical Impressions(s) / ED Diagnoses   Final diagnoses:  Epistaxis  Right-sided epistaxis    ED Discharge Orders         Ordered    doxycycline (VIBRAMYCIN) 100 MG capsule  2 times daily     12/15/18 2057    predniSONE (DELTASONE) 10 MG tablet  Daily     12/15/18 2057           Eustaquio Maize, PA-C 12/15/18 2312    Noemi Chapel, MD 12/16/18 [phone]

## 2018-12-15 NOTE — Discharge Instructions (Signed)
Please take medication as prescribed Follow up with PCP regarding symptoms Return to the ED for continued bleeding; nausea, vomiting, fevers, chills, shortness of breath

## 2018-12-15 NOTE — ED Provider Notes (Signed)
Medical screening examination/treatment/procedure(s) were conducted as a shared visit with non-physician practitioner(s) and myself.  I personally evaluated the patient during the encounter.  Clinical Impression:   Final diagnoses:  Epistaxis  Right-sided epistaxis      The patient is an 85 year old male, he is anticoagulated on Plavix.  Presents with epistaxis from the right nostril.  The patient has had several days of coughing and shortness of breath but today started having epistaxis.  He tried to apply pressure but continues to have a slow and steady drip.  Symptoms are persistent, on my exam he does have some right-sided anterior epistaxis which is well visualized.  He has only a minimal amount of blood in his posterior pharynx.  The lungs are mildly tachypneic with some wheezing.  His chest x-ray shows no acute findings though he does have a leukocytosis of 15,000.  The patient required some anterior nasal packing with lidocaine with epinephrine on gauze which stopped the bleeding which allowed me to cauterize this area successfully.  Bleeding was completely stopped and the patient was stable for discharge after nebulized treatment.  He will be discharged home on doxycycline and prednisone, the patient stable for discharge.  Flu negative, metabolic panel unremarkable  .Epistaxis Management Date/Time: 12/15/2018 8:53 PM Performed by: Noemi Chapel, MD Authorized by: Noemi Chapel, MD   Consent:    Consent obtained:  Verbal   Consent given by:  Patient   Risks discussed:  Bleeding   Alternatives discussed:  Alternative treatment Procedure details:    Treatment site:  R anterior   Treatment method:  Anterior pack   Treatment complexity:  Limited   Treatment episode: initial   Post-procedure details:    Assessment:  Bleeding stopped   Patient tolerance of procedure:  Tolerated well, no immediate complications Comments:     Afrin applied, nasal compression, packed with gauze and  lidocaine with epinephrine soaked, after 1 hour the patient had complete cessation and I can see the area that was bleeding on the septum, a small amount of silver nitrate cautery was used and the patient tolerated this very well.  He was given a nebulized treatment prior to discharge      Noemi Chapel, MD 12/16/18 [phone]

## 2018-12-15 NOTE — ED Notes (Signed)
Pt wheeled to waiting room. Pt verbalized understanding of discharge instructions.   

## 2018-12-16 ENCOUNTER — Inpatient Hospital Stay (HOSPITAL_COMMUNITY)
Admission: EM | Admit: 2018-12-16 | Discharge: 2018-12-22 | DRG: 191 | Disposition: A | Discharge status: Skilled Nursing Facility | Payer: Medicare Other | Attending: Internal Medicine | Admitting: Internal Medicine

## 2018-12-16 ENCOUNTER — Emergency Department (HOSPITAL_COMMUNITY): Payer: Medicare Other

## 2018-12-16 ENCOUNTER — Encounter (HOSPITAL_COMMUNITY): Payer: Self-pay | Admitting: Emergency Medicine

## 2018-12-16 ENCOUNTER — Other Ambulatory Visit: Payer: Self-pay

## 2018-12-16 DIAGNOSIS — Z79899 Other long term (current) drug therapy: Secondary | ICD-10-CM

## 2018-12-16 DIAGNOSIS — I639 Cerebral infarction, unspecified: Secondary | ICD-10-CM | POA: Diagnosis present

## 2018-12-16 DIAGNOSIS — D72828 Other elevated white blood cell count: Secondary | ICD-10-CM | POA: Diagnosis present

## 2018-12-16 DIAGNOSIS — Z7902 Long term (current) use of antithrombotics/antiplatelets: Secondary | ICD-10-CM

## 2018-12-16 DIAGNOSIS — Z9981 Dependence on supplemental oxygen: Secondary | ICD-10-CM

## 2018-12-16 DIAGNOSIS — T380X5A Adverse effect of glucocorticoids and synthetic analogues, initial encounter: Secondary | ICD-10-CM | POA: Diagnosis present

## 2018-12-16 DIAGNOSIS — J439 Emphysema, unspecified: Principal | ICD-10-CM | POA: Diagnosis present

## 2018-12-16 DIAGNOSIS — J9611 Chronic respiratory failure with hypoxia: Secondary | ICD-10-CM | POA: Diagnosis present

## 2018-12-16 DIAGNOSIS — T45525A Adverse effect of antithrombotic drugs, initial encounter: Secondary | ICD-10-CM | POA: Diagnosis present

## 2018-12-16 DIAGNOSIS — J441 Chronic obstructive pulmonary disease with (acute) exacerbation: Secondary | ICD-10-CM | POA: Diagnosis present

## 2018-12-16 DIAGNOSIS — Z8673 Personal history of transient ischemic attack (TIA), and cerebral infarction without residual deficits: Secondary | ICD-10-CM

## 2018-12-16 DIAGNOSIS — Z66 Do not resuscitate: Secondary | ICD-10-CM | POA: Diagnosis not present

## 2018-12-16 DIAGNOSIS — R627 Adult failure to thrive: Secondary | ICD-10-CM | POA: Diagnosis present

## 2018-12-16 DIAGNOSIS — T39015A Adverse effect of aspirin, initial encounter: Secondary | ICD-10-CM | POA: Diagnosis present

## 2018-12-16 DIAGNOSIS — I10 Essential (primary) hypertension: Secondary | ICD-10-CM | POA: Diagnosis present

## 2018-12-16 DIAGNOSIS — Z6823 Body mass index (BMI) 23.0-23.9, adult: Secondary | ICD-10-CM

## 2018-12-16 DIAGNOSIS — Z87891 Personal history of nicotine dependence: Secondary | ICD-10-CM

## 2018-12-16 DIAGNOSIS — E876 Hypokalemia: Secondary | ICD-10-CM | POA: Diagnosis not present

## 2018-12-16 DIAGNOSIS — Z7189 Other specified counseling: Secondary | ICD-10-CM

## 2018-12-16 DIAGNOSIS — R040 Epistaxis: Secondary | ICD-10-CM | POA: Diagnosis not present

## 2018-12-16 DIAGNOSIS — E440 Moderate protein-calorie malnutrition: Secondary | ICD-10-CM | POA: Diagnosis present

## 2018-12-16 DIAGNOSIS — E119 Type 2 diabetes mellitus without complications: Secondary | ICD-10-CM | POA: Diagnosis present

## 2018-12-16 DIAGNOSIS — Z823 Family history of stroke: Secondary | ICD-10-CM

## 2018-12-16 DIAGNOSIS — E785 Hyperlipidemia, unspecified: Secondary | ICD-10-CM | POA: Diagnosis present

## 2018-12-16 DIAGNOSIS — Z888 Allergy status to other drugs, medicaments and biological substances status: Secondary | ICD-10-CM

## 2018-12-16 DIAGNOSIS — I69319 Unspecified symptoms and signs involving cognitive functions following cerebral infarction: Secondary | ICD-10-CM

## 2018-12-16 DIAGNOSIS — F0150 Vascular dementia without behavioral disturbance: Secondary | ICD-10-CM | POA: Diagnosis present

## 2018-12-16 DIAGNOSIS — Z7982 Long term (current) use of aspirin: Secondary | ICD-10-CM

## 2018-12-16 LAB — COMPREHENSIVE METABOLIC PANEL
ALT: 12 U/L (ref 0–44)
AST: 16 U/L (ref 15–41)
Albumin: 3.2 g/dL — ABNORMAL LOW (ref 3.5–5.0)
Alkaline Phosphatase: 71 U/L (ref 38–126)
Anion gap: 11 (ref 5–15)
BUN: 20 mg/dL (ref 8–23)
CALCIUM: 9 mg/dL (ref 8.9–10.3)
CO2: 38 mmol/L — ABNORMAL HIGH (ref 22–32)
Chloride: 90 mmol/L — ABNORMAL LOW (ref 98–111)
Creatinine, Ser: 0.71 mg/dL (ref 0.61–1.24)
GFR calc Af Amer: >60 mL/min (ref >60)
GFR calc non Af Amer: >60 mL/min (ref >60)
Glucose, Bld: 119 mg/dL — ABNORMAL HIGH (ref 70–99)
Potassium: 3.3 mmol/L — ABNORMAL LOW (ref 3.5–5.1)
Sodium: 139 mmol/L (ref 135–145)
Total Bilirubin: 0.8 mg/dL (ref 0.3–1.2)
Total Protein: 6.5 g/dL (ref 6.5–8.1)

## 2018-12-16 LAB — CBC WITH DIFFERENTIAL/PLATELET
Abs Immature Granulocytes: 0.04 10*3/uL (ref 0.00–0.07)
Basophils Absolute: 0 10*3/uL (ref 0.0–0.1)
Basophils Relative: 0 %
EOS ABS: 0.1 10*3/uL (ref 0.0–0.5)
Eosinophils Relative: 0 %
HCT: 38 % — ABNORMAL LOW (ref 39.0–52.0)
HEMOGLOBIN: 12 g/dL — AB (ref 13.0–17.0)
Immature Granulocytes: 0 %
Lymphocytes Relative: 5 %
Lymphs Abs: 0.7 10*3/uL (ref 0.7–4.0)
MCH: 30.2 pg (ref 26.0–34.0)
MCHC: 31.6 g/dL (ref 30.0–36.0)
MCV: 95.7 fL (ref 80.0–100.0)
Monocytes Absolute: 0.9 10*3/uL (ref 0.1–1.0)
Monocytes Relative: 7 %
Neutro Abs: 12.4 10*3/uL — ABNORMAL HIGH (ref 1.7–7.7)
Neutrophils Relative %: 88 %
Platelets: 230 10*3/uL (ref 150–400)
RBC: 3.97 MIL/uL — ABNORMAL LOW (ref 4.22–5.81)
RDW: 13.3 % (ref 11.5–15.5)
WBC: 14.2 10*3/uL — AB (ref 4.0–10.5)
nRBC: 0 % (ref 0.0–0.2)

## 2018-12-16 LAB — URINALYSIS, ROUTINE W REFLEX MICROSCOPIC
Bacteria, UA: NONE SEEN
Bilirubin Urine: NEGATIVE
Glucose, UA: NEGATIVE mg/dL
Ketones, ur: 5 mg/dL — AB
Leukocytes,Ua: NEGATIVE
Nitrite: NEGATIVE
Protein, ur: 30 mg/dL — AB
Specific Gravity, Urine: 1.024 (ref 1.005–1.030)
pH: 5 (ref 5.0–8.0)

## 2018-12-16 LAB — TROPONIN I: Troponin I: <0.03 ng/mL (ref <0.03)

## 2018-12-16 LAB — BRAIN NATRIURETIC PEPTIDE: B Natriuretic Peptide: 105 pg/mL — ABNORMAL HIGH (ref 0.0–100.0)

## 2018-12-16 MED ORDER — SODIUM CHLORIDE 0.9 % IV SOLN: 250.0000 mL | INTRAVENOUS | Status: DC | PRN

## 2018-12-16 MED ORDER — TRAZODONE HCL 50 MG PO TABS
50.0000 mg | ORAL_TABLET | Freq: Every evening | ORAL | Status: DC | PRN
  Administered 2018-12-16 – 2018-12-19 (×3): 50 mg via ORAL
  Filled 2018-12-16 (×5): qty 1

## 2018-12-16 MED ORDER — POLYETHYLENE GLYCOL 3350 17 G PO PACK: 17.0000 g | PACK | Freq: Every day | ORAL | Status: DC | PRN

## 2018-12-16 MED ORDER — NITROGLYCERIN 0.4 MG SL SUBL: 0.4000 mg | SUBLINGUAL_TABLET | SUBLINGUAL | Status: DC | PRN

## 2018-12-16 MED ORDER — ALBUTEROL SULFATE (2.5 MG/3ML) 0.083% IN NEBU
2.5000 mg | INHALATION_SOLUTION | RESPIRATORY_TRACT | Status: DC | PRN
  Administered 2018-12-21 (×2): 2.5 mg via RESPIRATORY_TRACT
  Filled 2018-12-16 (×2): qty 3

## 2018-12-16 MED ORDER — ALBUTEROL SULFATE HFA 108 (90 BASE) MCG/ACT IN AERS: 2.0000 | INHALATION_SPRAY | Freq: Four times a day (QID) | RESPIRATORY_TRACT | Status: DC | PRN

## 2018-12-16 MED ORDER — SODIUM CHLORIDE 0.9% FLUSH: 3.0000 mL | INTRAVENOUS | Status: DC | PRN

## 2018-12-16 MED ORDER — SODIUM CHLORIDE 0.9 % IV BOLUS
1000.0000 mL | Freq: Once | INTRAVENOUS | Status: AC
  Administered 2018-12-16: 1000 mL via INTRAVENOUS

## 2018-12-16 MED ORDER — DOXYCYCLINE HYCLATE 100 MG PO TABS
100.0000 mg | ORAL_TABLET | Freq: Two times a day (BID) | ORAL | Status: AC
  Administered 2018-12-16 – 2018-12-20 (×8): 100 mg via ORAL
  Filled 2018-12-16 (×8): qty 1

## 2018-12-16 MED ORDER — ONDANSETRON HCL 4 MG/2ML IJ SOLN: 4.0000 mg | Freq: Four times a day (QID) | INTRAMUSCULAR | Status: DC | PRN

## 2018-12-16 MED ORDER — ESCITALOPRAM OXALATE 10 MG PO TABS
5.0000 mg | ORAL_TABLET | Freq: Every day | ORAL | Status: DC
  Administered 2018-12-16 – 2018-12-22 (×7): 5 mg via ORAL
  Filled 2018-12-16 (×7): qty 1

## 2018-12-16 MED ORDER — DEXTROSE-NACL 5-0.45 % IV SOLN
INTRAVENOUS | Status: DC
  Administered 2018-12-16: 19:00:00 via INTRAVENOUS

## 2018-12-16 MED ORDER — ACETAMINOPHEN 325 MG PO TABS: 650.0000 mg | ORAL_TABLET | Freq: Four times a day (QID) | ORAL | Status: DC | PRN

## 2018-12-16 MED ORDER — DONEPEZIL HCL 5 MG PO TABS
10.0000 mg | ORAL_TABLET | Freq: Every morning | ORAL | Status: DC
  Administered 2018-12-17 – 2018-12-22 (×6): 10 mg via ORAL
  Filled 2018-12-16 (×6): qty 2

## 2018-12-16 MED ORDER — ONDANSETRON HCL 4 MG PO TABS: 4.0000 mg | ORAL_TABLET | Freq: Four times a day (QID) | ORAL | Status: DC | PRN

## 2018-12-16 MED ORDER — ATORVASTATIN CALCIUM 40 MG PO TABS
80.0000 mg | ORAL_TABLET | Freq: Every evening | ORAL | Status: DC
  Administered 2018-12-16 – 2018-12-21 (×6): 80 mg via ORAL
  Filled 2018-12-16 (×6): qty 2

## 2018-12-16 MED ORDER — SALINE SPRAY 0.65 % NA SOLN
2.0000 | Freq: Three times a day (TID) | NASAL | Status: DC
  Administered 2018-12-16 – 2018-12-21 (×17): 2 via NASAL
  Filled 2018-12-16 (×2): qty 44

## 2018-12-16 MED ORDER — DIVALPROEX SODIUM ER 250 MG PO TB24
250.0000 mg | ORAL_TABLET | Freq: Every evening | ORAL | Status: DC
  Administered 2018-12-17 – 2018-12-21 (×5): 250 mg via ORAL
  Filled 2018-12-16 (×10): qty 1

## 2018-12-16 MED ORDER — PREDNISONE 20 MG PO TABS
40.0000 mg | ORAL_TABLET | Freq: Every day | ORAL | Status: DC
  Administered 2018-12-16 – 2018-12-17 (×2): 40 mg via ORAL
  Filled 2018-12-16 (×3): qty 2

## 2018-12-16 MED ORDER — ENSURE ENLIVE PO LIQD
237.0000 mL | Freq: Two times a day (BID) | ORAL | Status: DC
  Administered 2018-12-17 – 2018-12-22 (×9): 237 mL via ORAL

## 2018-12-16 MED ORDER — ACETAMINOPHEN 650 MG RE SUPP: 650.0000 mg | Freq: Four times a day (QID) | RECTAL | Status: DC | PRN

## 2018-12-16 MED ORDER — CLOPIDOGREL BISULFATE 75 MG PO TABS
75.0000 mg | ORAL_TABLET | Freq: Every day | ORAL | Status: DC
  Administered 2018-12-16 – 2018-12-22 (×7): 75 mg via ORAL
  Filled 2018-12-16 (×7): qty 1

## 2018-12-16 MED ORDER — TAMSULOSIN HCL 0.4 MG PO CAPS
0.4000 mg | ORAL_CAPSULE | Freq: Every day | ORAL | Status: DC
  Administered 2018-12-16 – 2018-12-22 (×7): 0.4 mg via ORAL
  Filled 2018-12-16 (×7): qty 1

## 2018-12-16 MED ORDER — VITAMIN D 25 MCG (1000 UNIT) PO TABS
5000.0000 [IU] | ORAL_TABLET | Freq: Every morning | ORAL | Status: DC
  Administered 2018-12-17 – 2018-12-22 (×6): 5000 [IU] via ORAL
  Filled 2018-12-16 (×6): qty 5

## 2018-12-16 MED ORDER — AMLODIPINE BESYLATE 5 MG PO TABS
2.5000 mg | ORAL_TABLET | Freq: Every day | ORAL | Status: DC
  Administered 2018-12-16 – 2018-12-22 (×7): 2.5 mg via ORAL
  Filled 2018-12-16 (×8): qty 1

## 2018-12-16 MED ORDER — SODIUM CHLORIDE 0.9% FLUSH
3.0000 mL | Freq: Two times a day (BID) | INTRAVENOUS | Status: DC
  Administered 2018-12-17 – 2018-12-21 (×10): 3 mL via INTRAVENOUS

## 2018-12-16 MED ORDER — OXYMETAZOLINE HCL 0.05 % NA SOLN
2.0000 | Freq: Once | NASAL | Status: AC
  Administered 2018-12-16: 2 via NASAL
  Filled 2018-12-16: qty 30

## 2018-12-16 MED ORDER — HEPARIN SODIUM (PORCINE) 5000 UNIT/ML IJ SOLN
5000.0000 [IU] | Freq: Three times a day (TID) | INTRAMUSCULAR | Status: DC
  Administered 2018-12-16 – 2018-12-22 (×17): 5000 [IU] via SUBCUTANEOUS
  Filled 2018-12-16 (×18): qty 1

## 2018-12-16 MED ORDER — ASPIRIN EC 81 MG PO TBEC
81.0000 mg | DELAYED_RELEASE_TABLET | Freq: Every day | ORAL | Status: DC
  Filled 2018-12-16: qty 1

## 2018-12-16 NOTE — ED Notes (Signed)
ED Provider at bedside. 

## 2018-12-16 NOTE — ED Notes (Signed)
Patient transported to X-ray 

## 2018-12-16 NOTE — Evaluation (Signed)
Physical Therapy Evaluation Patient Details Name: Luis [name] MRN: [id number] DOB: 1933/11/23 Today's Date: 12/16/2018   History of Present Illness  The patient is an 86 year old male, he is anticoagulated on Plavix.  Presents with epistaxis from the right nostril.  The patient has had several days of coughing and shortness of breath but today started having epistaxis.  He tried to apply pressure but continues to have a slow and steady drip.  Symptoms are persistent, on my exam he does have some right-sided anterior epistaxis which is well visualized.  He has only a minimal amount of blood in his posterior pharynx.    Clinical Impression  Patient demonstrates labored movement for sit to stands, unsteady on feet, slightly impulsive requiring occasional repeated verbal/tactile cueing, at risk for falls once fatigued due to SOB, able to ambulate in hallway without loss of balance, but decreased balance once fatigued and SOB with SpO2 dropping below 85% while on 2 LPM, once O2 increased to 3 LPM O2 at 93%.  Patient put back to bed after therapy with patient's son present at bedside.  Patient will benefit from continued physical therapy in hospital and recommended venue below to increase strength, balance, endurance for safe ADLs and gait.    Follow Up Recommendations SNF;Supervision/Assistance - 24 hour;Supervision for mobility/OOB    Equipment Recommendations  None recommended by PT    Recommendations for Other Services       Precautions / Restrictions Precautions Precautions: Fall Restrictions Weight Bearing Restrictions: No      Mobility  Bed Mobility Overal bed mobility: Needs Assistance Bed Mobility: Supine to Sit;Sit to Supine     Supine to sit: Supervision Sit to supine: Supervision   General bed mobility comments: labored movement  Transfers Overall transfer level: Needs assistance Equipment used: Rolling walker (2 wheeled);None Transfers: Sit to/from [employer] to Stand: Min guard Stand pivot transfers: Min guard       General transfer comment: slow labored movement  Ambulation/Gait Ambulation/Gait assistance: Min assist Gait Distance (Feet): 65 Feet Assistive device: Rolling walker (2 wheeled) Gait Pattern/deviations: Decreased step length - right;Decreased step length - left;Decreased stride length Gait velocity: decreased   General Gait Details: slightly labored slow cadence, slightly unsteady, no loss of balance, limited mostly due to faitgue and SOB with desaturaiton below 85% while on 2 LPM  Stairs            Wheelchair Mobility    Modified Rankin (Stroke Patients Only)       Balance Overall balance assessment: Needs assistance Sitting-balance support: No upper extremity supported;Feet supported Sitting balance-Leahy Scale: Good     Standing balance support: No upper extremity supported;During functional activity Standing balance-Leahy Scale: Poor Standing balance comment: fair/poor without AD, fair using RW                             Pertinent Vitals/Pain Pain Assessment: No/denies pain    Home Living Family/patient expects to be discharged to:: Private residence Living Arrangements: Spouse/significant other Available Help at Discharge: Family;Available PRN/intermittently;Other (Comment)(limited per family) Type of Home: House Home Access: Stairs to enter Entrance Stairs-Rails: None Entrance Stairs-Number of Steps: 4 Home Layout: One level Home Equipment: [place] - 4 wheels;Bedside commode;Tub bench      Prior Function Level of Independence: Needs assistance   Gait / Transfers Assistance Needed: household ambulator using leaning on furniture/walls or using rollator  ADL's / Homemaking Assistance Needed: assisted by  spouse        Hand Dominance        Extremity/Trunk Assessment   Upper Extremity Assessment Upper Extremity Assessment: Overall WFL for tasks assessed     Lower Extremity Assessment Lower Extremity Assessment: Generalized weakness    Cervical / Trunk Assessment Cervical / Trunk Assessment: Normal  Communication   Communication: No difficulties  Cognition Arousal/Alertness: Awake/alert Behavior During Therapy: WFL for tasks assessed/performed;Impulsive Overall Cognitive Status: Within Functional Limits for tasks assessed                                        General Comments      Exercises     Assessment/Plan    PT Assessment Patient needs continued PT services  PT Problem List Decreased strength;Decreased activity tolerance;Decreased balance;Decreased mobility       PT Treatment Interventions Gait training;Stair training;Functional mobility training;Therapeutic activities;Therapeutic exercise;Patient/family education    PT Goals (Current goals can be found in the Care Plan section)  Acute Rehab PT Goals Patient Stated Goal: return home(return home) PT Goal Formulation: With patient/family Time For Goal Achievement: 12/30/18 Potential to Achieve Goals: Good    Frequency Min 3X/week   Barriers to discharge        Co-evaluation               AM-PAC PT "6 Clicks" Mobility  Outcome Measure Help needed turning from your back to your side while in a flat bed without using bedrails?: None Help needed moving from lying on your back to sitting on the side of a flat bed without using bedrails?: None Help needed moving to and from a bed to a chair (including a wheelchair)?: A Little Help needed standing up from a chair using your arms (e.g., wheelchair or bedside chair)?: A Little Help needed to walk in hospital room?: A Little Help needed climbing 3-5 steps with a railing? : A Lot 6 Click Score: 19    End of Session Equipment Utilized During Treatment: Gait belt;Oxygen Activity Tolerance: Patient tolerated treatment well;Patient limited by fatigue Patient left: in bed;with call bell/phone within  reach;with family/visitor present Nurse Communication: Mobility status PT Visit Diagnosis: Unsteadiness on feet (R26.81);Other abnormalities of gait and mobility (R26.89);Muscle weakness (generalized) (M62.81)    Time: 8372-9021 PT Time Calculation (min) (ACUTE ONLY): 32 min   Charges:   PT Evaluation $PT Eval Moderate Complexity: 1 Mod PT Treatments $Therapeutic Activity: 23-37 mins        3:35 PM, 12/16/18 Lonell Grandchild, MPT Physical Therapist with [facility] [phone] office [phone] mobile phone

## 2018-12-16 NOTE — TOC Initial Note (Signed)
Transition of Care [facility]) - Initial/Assessment Note    Patient Details  Name: Luis [name] MRN: [id number] Date of Birth: 1934-08-14  Transition of Care [facility]) CM/SW Contact:    Sherald Barge, RN Phone Number: 12/16/2018, 2:33 PM  Clinical Narrative:    Second day patient has been brought to ED with nose bleeds. Wife and son at bedside. Pt has had decreased oral intake for last couple weeks. He is being treated for COPD exacerbation. His functional mobility is much worse than at baseline. Pt unable to ambulate at this time and wife/son both unable to "handle" patient. Family adamant he is unsafe at home under wife's supervision. This CM agree's patient is unsafe at home and has started bed search. Wife understands pt will have to stay until ins authorization is give.                Expected Discharge Plan: Skilled Nursing Facility Barriers to Discharge: SNF Pending bed offer, Insurance Authorization   Patient Goals and CMS Choice Patient states their goals for this hospitalization and ongoing recovery are:: become more ind so that he may return home CMS Medicare.gov Compare Post Acute Care list provided to:: Patient Represenative (must comment) Choice offered to / list presented to : Spouse, Adult Children  Expected Discharge Plan and Services Expected Discharge Plan: [place] Acute Care Choice: [place] Living arrangements for the past 2 months: Single Family Home                          Prior Living Arrangements/Services Living arrangements for the past 2 months: Single Family Home Lives with:: Spouse Patient language and need for interpreter reviewed:: No Do you feel safe going back to the place where you live?: No   family unable to physically care for patient  Need for Family Participation in Patient Care: Yes (Comment) Care giver support system in place?: Yes (comment) Current home services: DME Criminal Activity/Legal  Involvement Pertinent to Current Situation/Hospitalization: No - Comment as needed  Activities of Daily Living      Permission Sought/Granted Permission sought to share information with : Chartered certified accountant granted to share information with : Yes, Verbal Permission Granted     Permission granted to share info w AGENCY: [place], [place]        Emotional Assessment Appearance:: Appears stated age, Disheveled   Affect (typically observed): Accepting, Appropriate, Calm Orientation: : Oriented to Self, Oriented to Place, Oriented to Situation      Admission diagnosis:  nosebleed Patient Active Problem List   Diagnosis Date Noted  . Frequent nosebleeds 12/16/2018  . Medicare annual wellness visit, subsequent 12/19/2016  . Encounter for general adult medical examination with abnormal findings 12/19/2016  . Vascular dementia without behavioral disturbance ([place]) 11/21/2016  . TIA (transient ischemic attack) 11/12/2016  . Urinary frequency 08/01/2016  . Hypokalemia 08/01/2016  . History of stroke 12/29/2015  . GI bleed 12/19/2015  . Mild cognitive impairment 07/26/2015  . Hyperlipidemia 07/26/2015  . Acute encephalopathy   . Altered mental status 07/15/2015  . COPD exacerbation ([place]) 07/15/2015  . Hyponatremia 07/15/2015  . Essential hypertension 07/15/2015  . CVA (cerebral vascular accident) ([place]) 07/15/2015  . Chronic respiratory failure with hypoxia ([place]) 07/15/2015  . Diabetes mellitus ([place]) 07/15/2015  . Bilateral inguinal hernia 03/04/2013  . Vasomotor rhinitis 11/29/2012  . COPD mixed type ([place]) 11/08/2012   PCP:  Lance Sell, NP Pharmacy:   [facility], [place], [place] [address] [place] [address] Phone: [phone] Fax: [phone]     Social Determinants of Health (SDOH) Interventions    Readmission Risk Interventions  No flowsheet data found.

## 2018-12-16 NOTE — ED Provider Notes (Signed)
[facility] EMERGENCY DEPARTMENT Provider Note   CSN: [id number] Arrival date & time: 12/16/18  0254    History   Chief Complaint Chief Complaint  Patient presents with  . Epistaxis    HPI Luis [name] is a 85 y.o. male.     Patient is an 85 year old male with history of COPD, hypertension, diabetes, and dementia.  He presents today for evaluation of nosebleed.  He was seen yesterday with similar complaints.  He underwent cauterization with good results.  He was discharged to home, then the bleeding resumed this morning.  Patient denies additional injury or trauma.  He denies difficulty breathing.  Patient does take aspirin.  Bleeding has been controlled by direct pressure by EMS.  The history is provided by the patient.  Epistaxis  Location:  R nare Severity:  Moderate Progression:  Improving Context: anticoagulants   Relieved by:  Applying pressure Worsened by:  Nothing Ineffective treatments:  None tried   Past Medical History:  Diagnosis Date  . Arthritis   . COPD, severe ([place])   . Dysphagia   . GERD (gastroesophageal reflux disease)   . Heart murmur    In childhood  . History of stroke   . Hyperlipidemia   . Hypertension   . Type 2 diabetes mellitus ([place])   . Vascular dementia [facility])     Patient Active Problem List   Diagnosis Date Noted  . Medicare annual wellness visit, subsequent 12/19/2016  . Encounter for general adult medical examination with abnormal findings 12/19/2016  . Vascular dementia without behavioral disturbance ([place]) 11/21/2016  . TIA (transient ischemic attack) 11/12/2016  . Urinary frequency 08/01/2016  . Hypokalemia 08/01/2016  . History of stroke 12/29/2015  . GI bleed 12/19/2015  . Mild cognitive impairment 07/26/2015  . Hyperlipidemia 07/26/2015  . Acute encephalopathy   . Altered mental status 07/15/2015  . COPD exacerbation ([place]) 07/15/2015  . Hyponatremia 07/15/2015  . Essential hypertension 07/15/2015  . CVA  (cerebral vascular accident) ([place]) 07/15/2015  . Chronic respiratory failure with hypoxia ([place]) 07/15/2015  . Diabetes mellitus ([place]) 07/15/2015  . Bilateral inguinal hernia 03/04/2013  . Vasomotor rhinitis 11/29/2012  . COPD mixed type ([place]) 11/08/2012    Past Surgical History:  Procedure Laterality Date  . CATARACT EXTRACTION W/ INTRAOCULAR LENS IMPLANT Left 2011  . COLONOSCOPY N/A 01/03/2016   Procedure: COLONOSCOPY;  Surgeon: Aviva Signs, MD;  Location[facility];  Service: Gastroenterology;  Laterality: N/A;  . ESOPHAGEAL DILATION  2000's   x2  . EYE SURGERY    . INGUINAL HERNIA REPAIR Right 1970's  . INGUINAL HERNIA REPAIR Bilateral 03/12/2013   w/mesh bilaterally/notes 03/12/2013  . INGUINAL HERNIA REPAIR Bilateral 03/12/2013   Procedure: HERNIA REPAIR INGUINAL ADULT BILATERAL;  Surgeon: Merrie Roof, MD;  Location: [place];  Service: General;  Laterality: Bilateral;  . INSERTION OF MESH Bilateral 03/12/2013   Procedure: INSERTION OF MESH;  Surgeon: Merrie Roof, MD;  Location: [place];  Service: General;  Laterality: Bilateral;  . RETINAL DETACHMENT SURGERY Right 08/29/2010  . TONSILLECTOMY  1940's  . TRANSURETHRAL RESECTION OF PROSTATE  2002; 2013        Home Medications    Prior to Admission medications   Medication Sig Start Date End Date Taking? Authorizing Provider  albuterol (PROVENTIL HFA;VENTOLIN HFA) 108 (90 Base) MCG/ACT inhaler Inhale 2 puffs into the lungs every 6 (six) hours as needed for wheezing or shortness of breath. 06/12/18   Deneise Lever, MD  amLODipine (NORVASC) 2.5 MG tablet TAKE ONE TABLET BY MOUTH DAILY Patient taking differently: Take 2.5 mg by mouth every morning.  11/18/18   Lance Sell, NP  aspirin EC 325 MG EC tablet Take 1 tablet (325 mg total) by mouth daily. Patient taking differently: Take 325 mg by mouth every evening.  11/14/16   Nita Sells, MD  atorvastatin (LIPITOR) 80 MG tablet TAKE ONE TABLET BY MOUTH  DAILY. Patient taking differently: Take 80 mg by mouth every evening.  10/15/18   Lance Sell, NP  Cholecalciferol (D3 MAXIMUM STRENGTH) 5000 UNITS capsule Take 5,000 Units by mouth every morning.     [provider]  clopidogrel (PLAVIX) 75 MG tablet Take 1 tablet (75 mg total) by mouth daily. Patient taking differently: Take 75 mg by mouth every morning.  12/11/18   Lance Sell, NP  divalproex (DEPAKOTE ER) 250 MG 24 hr tablet Take 1 tablet (250 mg total) by mouth daily. Patient taking differently: Take 250 mg by mouth every evening.  05/20/18   Cameron Sprang, MD  donepezil (ARICEPT) 10 MG tablet Take 1 tablet daily Patient taking differently: Take 10 mg by mouth every morning.  05/20/18   Cameron Sprang, MD  doxycycline (VIBRAMYCIN) 100 MG capsule Take 1 capsule (100 mg total) by mouth 2 (two) times daily. 12/15/18   Venter, Margaux, PA-C  escitalopram (LEXAPRO) 5 MG tablet Take 1 tablet (5 mg total) by mouth daily. Patient taking differently: Take 5 mg by mouth every morning.  05/20/18   Cameron Sprang, MD  nitroGLYCERIN (NITROSTAT) 0.4 MG SL tablet Place 0.4 mg under the tongue every 5 (five) minutes as needed for chest pain. Reported on 03/19/2016    [provider]  OXYGEN Inhale 3 L into the lungs continuous. On 3 liters during the day and at night    [provider]  PERFOROMIST 20 MCG/2ML nebulizer solution INHALE CONTENTS OF 1 VIAL IN NEBULIZER TWICE DAILY. Patient taking differently: Take 20 mcg by nebulization 2 (two) times daily.  07/16/18   Baird Lyons D, MD  predniSONE (DELTASONE) 10 MG tablet Take 4 tablets (40 mg total) by mouth daily for 5 days. 12/15/18 12/20/18  Eustaquio Maize, PA-C  Respiratory Therapy Supplies (FLUTTER) DEVI Blow through as directed 12/06/16   Baird Lyons D, MD  tamsulosin (FLOMAX) 0.4 MG CAPS capsule TAKE ONE CAPSULE BY MOUTH DAILY. Patient taking differently: Take 0.4 mg by mouth every evening.  09/15/18   Lance Sell, NP    Family History Family History  Problem Relation Age of Onset  . Stroke Mother   . Healthy Father     Social History Social History   Tobacco Use  . Smoking status: Former Smoker    Packs/day: 2.00    Years: 35.00    Pack years: 70.00    Types: Cigarettes    Last attempt to quit: 10/08/1985    Years since quitting: 33.2  . Smokeless tobacco: Former Systems developer    Types: [place] date: 10/08/1997  Substance Use Topics  . Alcohol use: No    Alcohol/week: 2.0 standard drinks    Types: 2 Standard drinks or equivalent per week  . Drug use: No     Allergies   Tranexamic acid   Review of Systems Review of Systems  HENT: Positive for nosebleeds.   All other systems reviewed and are negative.    Physical Exam Updated Vital Signs BP (!) 129/57 (BP  Location: Right Arm)   Pulse 76   Temp 98.1 F (36.7 C) (Oral)   Resp 18   Ht 6' (1.829 m)   Wt 77 kg   SpO2 99%   BMI 23.02 kg/m   Physical Exam Vitals signs and nursing note reviewed.  Constitutional:      General: He is not in acute distress.    Appearance: Normal appearance. He is not ill-appearing.  HENT:     Head: Normocephalic and atraumatic.     Nose:     Comments: There is no active bleeding from the right nares.  No other visible abnormality. Cardiovascular:     Rate and Rhythm: Normal rate and regular rhythm.     Heart sounds: No murmur.  Pulmonary:     Effort: Pulmonary effort is normal. No respiratory distress.     Breath sounds: Normal breath sounds.  Musculoskeletal: Normal range of motion.  Skin:    General: Skin is warm and dry.  Neurological:     Mental Status: He is alert.      ED Treatments / Results  Labs (all labs ordered are listed, but only abnormal results are displayed) Labs Reviewed - No data to display  EKG None  Radiology Dg Chest [facility] 1 View  Result Date: 12/15/2018 CLINICAL DATA:  Cough.  Nose bleed today. EXAM: PORTABLE CHEST 1 VIEW COMPARISON:   01/09/2018 FINDINGS: Heart size is normal. There is ordinary aortic atherosclerosis. The pulmonary vascularity is normal. The lungs are clear. No effusions. No significant bone finding. IMPRESSION: No active disease. Electronically Signed   By: Nelson Chimes M.D.   On: 12/15/2018 17:31    Procedures Procedures (including critical care time)  Medications Ordered in ED Medications - No data to display   Initial Impression / Assessment and Plan / ED Course  I have reviewed the triage vital signs and the nursing notes.  Pertinent labs & imaging results that were available during my care of the patient were reviewed by me and considered in my medical decision making (see chart for details).  Patient's nosebleed controlled prior to arrival.  He was given Neo-Synephrine and observed with good hemostasis.  After the patient's family arrived, they expressed concern with his progressive decline over the past couple of weeks.  They state the patient has a history of dementia and has been increasingly less stable on his feet and unable to ambulate.  Work-up was initiated into this including CBC, CMP, UA, and chest x-ray.  These were all performed and are unremarkable.  Patient's family expressed concern with his living arrangements.  The son does not feel as though the patient's wife can handle him at home.  Patient was seen by case management who feels as though observation and placement in an extended care facility are indicated.  I have spoken with Dr. Denton Brick who agrees to admit.  Final Clinical Impressions(s) / ED Diagnoses   Final diagnoses:  None    ED Discharge Orders    None       Veryl Speak, MD 12/16/18 1515

## 2018-12-16 NOTE — NC FL2 (Signed)
[place] LEVEL OF CARE SCREENING TOOL     IDENTIFICATION  Patient Name: Luis [name] Birthdate: 03-Sep-1934 Sex: male Admission Date (Current Location): 12/16/2018  [facility] and [place] Number:  [employer] and Address:  [place] [address] [place]      Provider Number: [id number]  Attending Physician Name and Address:  Roxan Hockey, MD  Relative Name and Phone Number:  Jovahn Breit [phone]    Current Level of Care: Hospital Recommended Level of Care: [place] Prior Approval Number:    Date Approved/Denied:   PASRR Number: [id number]  Discharge Plan: SNF    Current Diagnoses: Patient Active Problem List   Diagnosis Date Noted  . Frequent nosebleeds 12/16/2018  . Medicare annual wellness visit, subsequent 12/19/2016  . Encounter for general adult medical examination with abnormal findings 12/19/2016  . Vascular dementia without behavioral disturbance ([place]) 11/21/2016  . TIA (transient ischemic attack) 11/12/2016  . Urinary frequency 08/01/2016  . Hypokalemia 08/01/2016  . History of stroke 12/29/2015  . GI bleed 12/19/2015  . Mild cognitive impairment 07/26/2015  . Hyperlipidemia 07/26/2015  . Acute encephalopathy   . Altered mental status 07/15/2015  . COPD exacerbation ([place]) 07/15/2015  . Hyponatremia 07/15/2015  . Essential hypertension 07/15/2015  . CVA (cerebral vascular accident) ([place]) 07/15/2015  . Chronic respiratory failure with hypoxia ([place]) 07/15/2015  . Diabetes mellitus ([place]) 07/15/2015  . Bilateral inguinal hernia 03/04/2013  . Vasomotor rhinitis 11/29/2012  . COPD mixed type ([place]) 11/08/2012    Orientation RESPIRATION BLADDER Height & Weight     Self, Situation, Place  O2(2lpm ) Continent Weight: 77 kg Height:  6' (182.9 cm)  BEHAVIORAL SYMPTOMS/MOOD NEUROLOGICAL BOWEL NUTRITION STATUS      Continent Diet(regular)  AMBULATORY STATUS COMMUNICATION OF NEEDS Skin    Extensive Assist Verbally Normal                       Personal Care Assistance Level of Assistance  Bathing, Feeding, Dressing Bathing Assistance: Maximum assistance Feeding assistance: Independent Dressing Assistance: Limited assistance     Functional Limitations Info  Sight, Hearing, Speech Sight Info: Adequate Hearing Info: Adequate Speech Info: Adequate    SPECIAL CARE FACTORS FREQUENCY  PT (By licensed PT)     PT Frequency: 5days/week              Contractures Contractures Info: Not present    Additional Factors Info  Allergies, Code Status Code Status Info: Full Allergies Info: Tranexamic Acid           Current Medications (12/16/2018):  This is the current hospital active medication list No current facility-administered medications for this encounter.    Current Outpatient Medications  Medication Sig Dispense Refill  . albuterol (PROVENTIL HFA;VENTOLIN HFA) 108 (90 Base) MCG/ACT inhaler Inhale 2 puffs into the lungs every 6 (six) hours as needed for wheezing or shortness of breath. 1 Inhaler 12  . amLODipine (NORVASC) 2.5 MG tablet TAKE ONE TABLET BY MOUTH DAILY (Patient taking differently: Take 2.5 mg by mouth every morning. ) 90 tablet 0  . aspirin EC 325 MG EC tablet Take 1 tablet (325 mg total) by mouth daily. (Patient taking differently: Take 325 mg by mouth every evening. ) 30 tablet 0  . atorvastatin (LIPITOR) 80 MG tablet TAKE ONE TABLET BY MOUTH DAILY. (Patient taking differently: Take 80 mg by mouth every evening. ) 90 tablet 0  . Cholecalciferol (D3 MAXIMUM  STRENGTH) 5000 UNITS capsule Take 5,000 Units by mouth every morning.     . clopidogrel (PLAVIX) 75 MG tablet Take 1 tablet (75 mg total) by mouth daily. (Patient taking differently: Take 75 mg by mouth every morning. ) 90 tablet 0  . divalproex (DEPAKOTE ER) 250 MG 24 hr tablet Take 1 tablet (250 mg total) by mouth daily. (Patient taking differently: Take 250 mg by mouth every evening. )  90 tablet 3  . donepezil (ARICEPT) 10 MG tablet Take 1 tablet daily (Patient taking differently: Take 10 mg by mouth every morning. ) 90 tablet 3  . doxycycline (VIBRAMYCIN) 100 MG capsule Take 1 capsule (100 mg total) by mouth 2 (two) times daily. 20 capsule 0  . escitalopram (LEXAPRO) 5 MG tablet Take 1 tablet (5 mg total) by mouth daily. (Patient taking differently: Take 5 mg by mouth every morning. ) 90 tablet 3  . nitroGLYCERIN (NITROSTAT) 0.4 MG SL tablet Place 0.4 mg under the tongue every 5 (five) minutes as needed for chest pain. Reported on 03/19/2016    . OXYGEN Inhale 3 L into the lungs continuous. On 3 liters during the day and at night    . PERFOROMIST 20 MCG/2ML nebulizer solution INHALE CONTENTS OF 1 VIAL IN NEBULIZER TWICE DAILY. (Patient taking differently: Take 20 mcg by nebulization 2 (two) times daily. ) 120 mL 6  . predniSONE (DELTASONE) 10 MG tablet Take 4 tablets (40 mg total) by mouth daily for 5 days. 20 tablet 0  . Respiratory Therapy Supplies (FLUTTER) DEVI Blow through as directed 1 each 0  . tamsulosin (FLOMAX) 0.4 MG CAPS capsule TAKE ONE CAPSULE BY MOUTH DAILY. (Patient taking differently: Take 0.4 mg by mouth every evening. ) 90 capsule 1     Discharge Medications: Please see discharge summary for a list of discharge medications.  Relevant Imaging Results:  Relevant Lab Results:   Additional Information SS# [id number]  Sherald Barge, RN

## 2018-12-16 NOTE — ED Notes (Signed)
Hospitalist at bedside 

## 2018-12-16 NOTE — ED Triage Notes (Signed)
Pt here for epistaxis. Pt was seen in ED for same complaint yesterday. Pt sent home after bleeding stopped. Stated it began again this morning. Only RT nostril is bleeding at this time. EMS reports bleeding has slowed.

## 2018-12-16 NOTE — H&P (Addendum)
Patient Demographics:    Luis [name], is a 85 y.o. male  MRN: [id number]   DOB - 10-26-1933  Admit Date - 12/16/2018  Outpatient Primary MD for the patient is Lance Sell, NP   Assessment & Plan:    Principal Problem:   Frequent nosebleeds Active Problems:   Essential hypertension   CVA (cerebral vascular accident) ([place])   Chronic respiratory failure with hypoxia ([place])   History of stroke   Vascular dementia without behavioral disturbance ([place])   1)Recurrent Nosebleeds----patient seen in ED on 12/15/2018 on 12/16/2018 for nosebleeds, appears hemostatic at this time-suspect due to aspirin/Plavix combo and also due to continuous oxygen use via nasal cannula patient will need more humidification and saline nose spray .... Will observe overnight for further nosebleeds and recheck CBC in a.m.  2)Generalized weakness/debility-no recent falls at home PT eval appreciated recommends SNF rehab and 24-hour supervision, case management consult appreciated, please see HPI  3)Chronic hypoxic respiratory failure--- requirement appears to be at baseline, no acute exacerbation of COPD at this time, continue supplemental oxygen via nasal cannula with humidifier  4) COPD--- please see #3 above, patient is completing doxycycline and prednisone as previously prescribed , continue bronchodilators  5)H/o CVA in 2016 and 2017----prior to admission patient was taking aspirin 325 mg daily as well as Plavix 75 mg is probably contributing to his nosebleeds, okay to stop aspirin and continue Plavix 75 mg daily for secondary stroke prophylaxis, patient has no history of coronary stents or peripheral artery disease with stenting  6) history of vascular dementia--- labile at this time, continue Aricept and Depakote  7)Leukocytosis--very  due to recent steroid use, patient is currently on doxycycline and prednisone   With History of - Reviewed by me  Past Medical History:  Diagnosis Date  . Arthritis   . COPD, severe ([place])   . Dysphagia   . GERD (gastroesophageal reflux disease)   . Heart murmur    In childhood  . History of stroke   . Hyperlipidemia   . Hypertension   . Type 2 diabetes mellitus ([place])   . Vascular dementia [facility])       Past Surgical History:  Procedure Laterality Date  . CATARACT EXTRACTION W/ INTRAOCULAR LENS IMPLANT Left 2011  . COLONOSCOPY N/A 01/03/2016   Procedure: COLONOSCOPY;  Surgeon: Aviva Signs, MD;  [facility];  Service: Gastroenterology;  Laterality: N/A;  . ESOPHAGEAL DILATION  2000's   x2  . EYE SURGERY    . INGUINAL HERNIA REPAIR Right 1970's  . INGUINAL HERNIA REPAIR Bilateral 03/12/2013   w/mesh bilaterally/notes 03/12/2013  . INGUINAL HERNIA REPAIR Bilateral 03/12/2013   Procedure: HERNIA REPAIR INGUINAL ADULT BILATERAL;  Surgeon: Merrie Roof, MD;  Location: [place];  Service: General;  Laterality: Bilateral;  . INSERTION OF MESH Bilateral 03/12/2013   Procedure: INSERTION OF MESH;  Surgeon: Merrie Roof, MD;  Location: [place];  Service: General;  Laterality: Bilateral;  . RETINAL DETACHMENT SURGERY Right 08/29/2010  . TONSILLECTOMY  1940's  . TRANSURETHRAL RESECTION OF PROSTATE  2002; 2013      Chief Complaint  Patient presents with  . Epistaxis      HPI:    Luis [name]  is a 85 y.o. male  COPD, chronic hypoxic respiratory failure on 2 to 3 L of oxygen at home,  hypertension, diabetes, and dementia who presents to the ED for the second day in a row with concerns about nosebleed  Patient was seen in the ED on 12/15/2018 for nosebleed after cauterization nosebleed resolved and was discharged home, returns today by EMS with another episode of nosebleed EMS applied pressure, ED physician applied Neo-Synephrine observed patient and no further nosebleed  noted  EDP attempted to discharge patient home with family, family expressed concerns about patient's debility and weakness and patient's wife is 78 years old unable to apparently safely care for him at home  EDP contacted case manager to help with disposition on possible home health services Please see TOC note from case manager dated 12/16/2018 timed at 239 pm.... Case manager deems patient home situation unsafe.... Recommends transition to SNF facility  Physical therapist evaluated patient in ED recommends 24-hour supervision and possibly SNF  ED work-up reviewed... Additional history obtained from patient's wife and son at bedside... Concerns about progressive weakness and debility, no recent falls at home  No chest pains, no focal weakness, no speech disturbance, no stroke-like symptoms or concerns--- just progressive decline, poor oral intake with increasing weakness and debility  In ED--- chest x-ray without acute findings, WBC 14.2, however patient was recently treated for COPD with steroids and antibiotics, BNP is 105 and troponin is negative.  UA does not suggest UTI No fever  Or chills ,  No Nausea, Vomiting or Diarrhea Recent influenza test was negative  PTA  patient was taking aspirin 325 mg daily as well as Plavix 75 mg is probably contributing to his nosebleeds, okay to stop aspirin and continue Plavix 75 mg daily for secondary stroke prophylaxis, patient has no history of coronary stents or peripheral artery disease with stenting     Review of systems:    In addition to the HPI above,   A full Review of  Systems was done, all other systems reviewed are negative except as noted above in HPI , .    Social History:  Reviewed by me    Social History   Tobacco Use  . Smoking status: Former Smoker    Packs/day: 2.00    Years: 35.00    Pack years: 70.00    Types: Cigarettes    Last attempt to quit: 10/08/1985    Years since quitting: 33.2  . Smokeless tobacco: Former  Systems developer    Types: [place] date: 10/08/1997  Substance Use Topics  . Alcohol use: No    Alcohol/week: 2.0 standard drinks    Types: 2 Standard drinks or equivalent per week       Family History :  Reviewed by me    Family History  Problem Relation Age of Onset  . Stroke Mother   . Healthy Father      Home Medications:   Prior to Admission medications   Medication Sig Start Date End Date Taking? Authorizing Provider  albuterol (PROVENTIL HFA;VENTOLIN HFA) 108 (90 Base) MCG/ACT inhaler Inhale 2 puffs into the lungs every 6 (six) hours as needed for wheezing or shortness of breath. 06/12/18  Yes Baird Lyons D, MD  amLODipine (NORVASC) 2.5 MG tablet TAKE ONE TABLET BY MOUTH DAILY Patient taking differently: Take 2.5 mg by mouth every morning.  11/18/18  Yes Lance Sell, NP  aspirin EC 325 MG EC tablet Take 1 tablet (325 mg total) by mouth daily. Patient taking differently: Take 325 mg by mouth every evening.  11/14/16  Yes Nita Sells, MD  atorvastatin (LIPITOR) 80 MG tablet TAKE ONE TABLET BY MOUTH DAILY. Patient taking differently: Take 80 mg by mouth every evening.  10/15/18  Yes Lance Sell, NP  Cholecalciferol (D3 MAXIMUM STRENGTH) 5000 UNITS capsule Take 5,000 Units by mouth every morning.    Yes [provider]  clopidogrel (PLAVIX) 75 MG tablet Take 1 tablet (75 mg total) by mouth daily. Patient taking differently: Take 75 mg by mouth every morning.  12/11/18  Yes Lance Sell, NP  divalproex (DEPAKOTE ER) 250 MG 24 hr tablet Take 1 tablet (250 mg total) by mouth daily. Patient taking differently: Take 250 mg by mouth every evening.  05/20/18  Yes Cameron Sprang, MD  donepezil (ARICEPT) 10 MG tablet Take 1 tablet daily Patient taking differently: Take 10 mg by mouth every morning.  05/20/18  Yes Cameron Sprang, MD  doxycycline (VIBRAMYCIN) 100 MG capsule Take 1 capsule (100 mg total) by mouth 2 (two) times daily. 12/15/18  Yes Venter,  Margaux, PA-C  escitalopram (LEXAPRO) 5 MG tablet Take 1 tablet (5 mg total) by mouth daily. Patient taking differently: Take 5 mg by mouth every morning.  05/20/18  Yes Cameron Sprang, MD  nitroGLYCERIN (NITROSTAT) 0.4 MG SL tablet Place 0.4 mg under the tongue every 5 (five) minutes as needed for chest pain. Reported on 03/19/2016   Yes [provider]  OXYGEN Inhale 3 L into the lungs continuous. On 3 liters during the day and at night   Yes [provider]  PERFOROMIST 20 MCG/2ML nebulizer solution INHALE CONTENTS OF 1 VIAL IN NEBULIZER TWICE DAILY. Patient taking differently: Take 20 mcg by nebulization 2 (two) times daily.  07/16/18  Yes Young, Tarri Fuller D, MD  predniSONE (DELTASONE) 10 MG tablet Take 4 tablets (40 mg total) by mouth daily for 5 days. 12/15/18 12/20/18 Yes Eustaquio Maize, PA-C  Respiratory Therapy Supplies (FLUTTER) DEVI Blow through as directed 12/06/16  Yes Young, Tarri Fuller D, MD  tamsulosin (FLOMAX) 0.4 MG CAPS capsule TAKE ONE CAPSULE BY MOUTH DAILY. Patient taking differently: Take 0.4 mg by mouth every evening.  09/15/18  Yes Lance Sell, NP     Allergies:     Allergies  Allergen Reactions  . Tranexamic Acid Anaphylaxis    Had in 09/09/2016 in [facility] ED and had anaphylaxis reaction     Physical Exam:   Vitals  Blood pressure (!) 152/71, pulse 62, temperature 98.1 F (36.7 C), temperature source Oral, resp. rate 18, height 6' (1.829 m), weight 77 kg, SpO2 100 %.  Physical Examination: General appearance - alert, frail appearing, in no distress  Mental status - alert, oriented to person, place, and time,  Eyes - sclera anicteric Nose- [place] 2 L/min, no further nosebleeds, hemostatic Neck - supple, no JVD elevation , Chest -slightly diminished in bases, no rhonchi no rales no wheezing Heart - S1 and S2 normal, regular  Abdomen - soft, nontender, nondistended, no masses or organomegaly Neurological -generalized weakness without new focal  deficits Extremities - no pedal edema noted, intact peripheral pulses  Skin - warm, dry  Data Review:    CBC Recent Labs  Lab 12/15/18 1655 12/16/18 1138  WBC 15.1* 14.2*  HGB 12.6* 12.0*  HCT 39.6 38.0*  PLT 232 230  MCV 95.4 95.7  MCH 30.4 30.2  MCHC 31.8 31.6  RDW 13.3 13.3  LYMPHSABS 0.3* 0.7  MONOABS 1.2* 0.9  EOSABS 0.0 0.1  BASOSABS 0.0 0.0   ------------------------------------------------------------------------------------------------------------------  Chemistries  Recent Labs  Lab 12/15/18 1655 12/16/18 1138  NA 137 139  K 3.5 3.3*  CL 91* 90*  CO2 36* 38*  GLUCOSE 140* 119*  BUN 18 20  CREATININE 0.64 0.71  CALCIUM 9.1 9.0  AST 16 16  ALT 13 12  ALKPHOS 72 71  BILITOT 0.9 0.8   ------------------------------------------------------------------------------------------------------------------ estimated creatinine clearance is 77.5 mL/min (by C-G formula based on SCr of 0.71 mg/dL). ------------------------------------------------------------------------------------------------------------------ No results for input(s): TSH, T4TOTAL, T3FREE, THYROIDAB in the last 72 hours.  Invalid input(s): FREET3   Coagulation profile No results for input(s): INR, PROTIME in the last 168 hours. ------------------------------------------------------------------------------------------------------------------- No results for input(s): DDIMER in the last 72 hours. -------------------------------------------------------------------------------------------------------------------  Cardiac Enzymes Recent Labs  Lab 12/16/18 1138  TROPONINI <0.03   ------------------------------------------------------------------------------------------------------------------    Component Value Date/Time   BNP 105.0 (H) 12/16/2018 1138     ---------------------------------------------------------------------------------------------------------------  Urinalysis     Component Value Date/Time   COLORURINE YELLOW 12/16/2018 1138   APPEARANCEUR HAZY (A) 12/16/2018 1138   LABSPEC 1.024 12/16/2018 1138   PHURINE 5.0 12/16/2018 1138   GLUCOSEU NEGATIVE 12/16/2018 1138   GLUCOSEU NEGATIVE 04/09/2018 1050   HGBUR SMALL (A) 12/16/2018 1138   BILIRUBINUR NEGATIVE 12/16/2018 1138   KETONESUR 5 (A) 12/16/2018 1138   PROTEINUR 30 (A) 12/16/2018 1138   UROBILINOGEN 0.2 04/09/2018 1050   NITRITE NEGATIVE 12/16/2018 1138   LEUKOCYTESUR NEGATIVE 12/16/2018 1138    ----------------------------------------------------------------------------------------------------------------   Imaging Results:    Dg Chest 2 View  Result Date: 12/16/2018 CLINICAL DATA:  Shortness of breath.  Cough. EXAM: CHEST - 2 VIEW COMPARISON:  Chest x-rays dated 12/15/2018 and 01/09/2018 and chest CT dated 01/09/2018 FINDINGS: The heart size and pulmonary vascularity are normal. Aortic atherosclerosis. No infiltrates or effusions. The lungs are hyperinflated with flattening of the diaphragm consistent with emphysema. Scarring in the lingula, chronic. No significant bone abnormality. IMPRESSION: 1. No acute abnormalities. 2. Aortic Atherosclerosis (ICD10-I70.0) and Emphysema (ICD10-J43.9). Electronically Signed   By: Lorriane Shire M.D.   On: 12/16/2018 12:16   Dg Chest Port 1 View  Result Date: 12/15/2018 CLINICAL DATA:  Cough.  Nose bleed today. EXAM: PORTABLE CHEST 1 VIEW COMPARISON:  01/09/2018 FINDINGS: Heart size is normal. There is ordinary aortic atherosclerosis. The pulmonary vascularity is normal. The lungs are clear. No effusions. No significant bone finding. IMPRESSION: No active disease. Electronically Signed   By: Nelson Chimes M.D.   On: 12/15/2018 17:31    Radiological Exams on Admission: Dg Chest 2 View  Result Date: 12/16/2018 CLINICAL DATA:  Shortness of breath.  Cough. EXAM: CHEST - 2 VIEW COMPARISON:  Chest x-rays dated 12/15/2018 and 01/09/2018 and chest CT dated  01/09/2018 FINDINGS: The heart size and pulmonary vascularity are normal. Aortic atherosclerosis. No infiltrates or effusions. The lungs are hyperinflated with flattening of the diaphragm consistent with emphysema. Scarring in the lingula, chronic. No significant bone abnormality. IMPRESSION: 1. No acute abnormalities. 2. Aortic Atherosclerosis (ICD10-I70.0) and Emphysema (ICD10-J43.9). Electronically Signed   By: Lorriane Shire M.D.   On: 12/16/2018 12:16   Dg Chest [facility]  Result Date: 12/15/2018 CLINICAL DATA:  Cough.  Nose bleed today. EXAM: PORTABLE CHEST 1 VIEW COMPARISON:  01/09/2018 FINDINGS: Heart size is normal. There is ordinary aortic atherosclerosis. The pulmonary vascularity is normal. The lungs are clear. No effusions. No significant bone finding. IMPRESSION: No active disease. Electronically Signed   By: Nelson Chimes M.D.   On: 12/15/2018 17:31    DVT Prophylaxis -SCD   AM Labs Ordered, also please review Full Orders  Family Communication: Admission, patients condition and plan of care including tests being ordered have been discussed with the patient and wife and son who indicate understanding and agree with the plan   Code Status - Full Code  Likely DC to  SNF  Condition   stable Roxan Hockey M.D on 12/16/2018 at 7:18 PM Go to [email or web address] -  for contact info  [facility] - Office  [phone]

## 2018-12-16 NOTE — Plan of Care (Signed)
  Problem: Acute Rehab PT Goals(only PT should resolve) Goal: Pt Will Go Supine/Side To Sit Outcome: Progressing Flowsheets (Taken 12/16/2018 1537) Pt will go Supine/Side to Sit: with modified independence Goal: Patient Will Transfer Sit To/From Stand Outcome: Progressing Flowsheets (Taken 12/16/2018 1537) Patient will transfer sit to/from stand: with supervision Goal: Pt Will Transfer Bed To Chair/Chair To Bed Outcome: Progressing Flowsheets (Taken 12/16/2018 1537) Pt will Transfer Bed to Chair/Chair to Bed: with supervision Goal: Pt Will Ambulate Outcome: Progressing Flowsheets (Taken 12/16/2018 1537) Pt will Ambulate: > 125 feet; with supervision; with rolling walker   3:37 PM, 12/16/18 Lonell Grandchild, MPT Physical Therapist with [facility] [phone] office [phone] mobile phone

## 2018-12-17 DIAGNOSIS — F0150 Vascular dementia without behavioral disturbance: Secondary | ICD-10-CM | POA: Diagnosis not present

## 2018-12-17 DIAGNOSIS — J9611 Chronic respiratory failure with hypoxia: Secondary | ICD-10-CM | POA: Diagnosis not present

## 2018-12-17 DIAGNOSIS — I10 Essential (primary) hypertension: Secondary | ICD-10-CM | POA: Diagnosis not present

## 2018-12-17 DIAGNOSIS — J441 Chronic obstructive pulmonary disease with (acute) exacerbation: Secondary | ICD-10-CM

## 2018-12-17 DIAGNOSIS — R040 Epistaxis: Secondary | ICD-10-CM | POA: Diagnosis not present

## 2018-12-17 LAB — CBC
HCT: 37.7 % — ABNORMAL LOW (ref 39.0–52.0)
HEMOGLOBIN: 11.6 g/dL — AB (ref 13.0–17.0)
MCH: 29.8 pg (ref 26.0–34.0)
MCHC: 30.8 g/dL (ref 30.0–36.0)
MCV: 96.9 fL (ref 80.0–100.0)
Platelets: 242 10*3/uL (ref 150–400)
RBC: 3.89 MIL/uL — ABNORMAL LOW (ref 4.22–5.81)
RDW: 13.2 % (ref 11.5–15.5)
WBC: 9.3 10*3/uL (ref 4.0–10.5)
nRBC: 0 % (ref 0.0–0.2)

## 2018-12-17 LAB — BASIC METABOLIC PANEL
Anion gap: 8 (ref 5–15)
BUN: 16 mg/dL (ref 8–23)
CHLORIDE: 94 mmol/L — AB (ref 98–111)
CO2: 38 mmol/L — AB (ref 22–32)
Calcium: 9.1 mg/dL (ref 8.9–10.3)
Creatinine, Ser: 0.58 mg/dL — ABNORMAL LOW (ref 0.61–1.24)
GFR calc Af Amer: >60 mL/min (ref >60)
GFR calc non Af Amer: >60 mL/min (ref >60)
Glucose, Bld: 157 mg/dL — ABNORMAL HIGH (ref 70–99)
Potassium: 3.6 mmol/L (ref 3.5–5.1)
Sodium: 140 mmol/L (ref 135–145)

## 2018-12-17 MED ORDER — BUDESONIDE 0.5 MG/2ML IN SUSP
0.5000 mg | Freq: Two times a day (BID) | RESPIRATORY_TRACT | Status: DC
  Administered 2018-12-17 – 2018-12-22 (×9): 0.5 mg via RESPIRATORY_TRACT
  Filled 2018-12-17 (×9): qty 2

## 2018-12-17 MED ORDER — IPRATROPIUM-ALBUTEROL 0.5-2.5 (3) MG/3ML IN SOLN
3.0000 mL | Freq: Four times a day (QID) | RESPIRATORY_TRACT | Status: DC
  Administered 2018-12-17 – 2018-12-19 (×6): 3 mL via RESPIRATORY_TRACT
  Filled 2018-12-17 (×7): qty 3

## 2018-12-17 MED ORDER — METHYLPREDNISOLONE SODIUM SUCC 40 MG IJ SOLR
40.0000 mg | Freq: Two times a day (BID) | INTRAMUSCULAR | Status: DC
  Administered 2018-12-17 – 2018-12-19 (×4): 40 mg via INTRAVENOUS
  Filled 2018-12-17 (×4): qty 1

## 2018-12-17 MED ORDER — OCUVITE-LUTEIN PO CAPS
1.0000 | ORAL_CAPSULE | Freq: Every day | ORAL | Status: DC
  Administered 2018-12-17 – 2018-12-22 (×6): 1 via ORAL
  Filled 2018-12-17 (×6): qty 1

## 2018-12-17 NOTE — Progress Notes (Signed)
Initial Nutrition Assessment  DOCUMENTATION CODES:   Non-severe (moderate) malnutrition in context of chronic illness  INTERVENTION:  Obtain current standing weight if patient is able  Liberalize diet to regular- due to poor oral intake  Ensure Enlive po BID, each supplement provides 350 kcal and 20 grams of protein   Multivitamin daily   NUTRITION DIAGNOSIS:   Moderate Malnutrition(severe COPD, chronic hypoxic respiraotry failure, dementia) related to chronic illness, poor appetite(pleasantly confused-thinks he is eating well) as evidenced by mild fat depletion, mild muscle depletion, per patient/family report.   GOAL:   Patient will meet greater than or equal to 90% of their needs(if feasible gvien his cognitive impairment)  MONITOR:   PO intake, Supplement acceptance, Skin, Weight trends, Labs REASON FOR ASSESSMENT:   Malnutrition Screening Tool    ASSESSMENT: Pateint is an 85 yo male with history of vascular dementia, severe COPD, Chronic hypoxic respiratory failure, DM-2, GERD and dysphagia. He presents with recurring nosebleeds, generalized weakness and debility.   Patient spouse is present and provided history. Patient is pleasant but unable to provide intake details. Wife says patient has been eating poorly for some time but thinks he is eating ok. Meal intake 50% today lunch. Breakfast intake unknown. He has also consumed an Ensure 100% today.   Patient weight history is questionable- documented as exactly 77 kg the past 3 entries September and March.   Medications reviewed and include: lipitor,, aricept, plavix, Vitamin D3, lexapro   Labs: BMP Latest Ref Rng & Units 12/17/2018 12/16/2018 12/15/2018  Glucose 70 - 99 mg/dL 157(H) 119(H) 140(H)  BUN 8 - 23 mg/dL 16 20 18   Creatinine 0.61 - 1.24 mg/dL 0.58(L) 0.71 0.64  Sodium 135 - 145 mmol/L 140 139 137  Potassium 3.5 - 5.1 mmol/L 3.6 3.3(L) 3.5  Chloride 98 - 111 mmol/L 94(L) 90(L) 91(L)  CO2 22 - 32 mmol/L 38(H)  38(H) 36(H)  Calcium 8.9 - 10.3 mg/dL 9.1 9.0 9.1    NUTRITION - FOCUSED PHYSICAL EXAM: severe clavicle muscle loss, moderate temporal, thoracic, dorsal muscle loss. Moderate orbital and buccal fat depletion   Diet Order:   Diet Order            Diet Heart Room service appropriate? Yes; Fluid consistency: Thin  Diet effective now              EDUCATION NEEDS:   Education needs have been addressed Skin:  Skin Assessment: Reviewed RN Assessment(very dry bilateral forearms)  Last BM:  3/10  Height:   Ht Readings from Last 1 Encounters:  12/16/18 6' (1.829 m)    Weight:   Wt Readings from Last 1 Encounters:  12/16/18 77 kg    Ideal Body Weight:  81 kg  BMI:  Body mass index is 23.02 kg/m.  Estimated Nutritional Needs:   Kcal:  5465-6812 (25-27 kcal/kg/bw)  Protein:  92-100 gr (1.2-1.3 gr/kg/bw)  Fluid:  >1900 ml daily  Colman Cater MS,RD,CSG,LDN Office: [phone] Pager: [phone] 81 kg

## 2018-12-17 NOTE — Care Management Obs Status (Signed)
[place] NOTIFICATION   Patient Details  Name: KALID GHAN MRN: [id number] Date of Birth: [birth year]   Medicare Observation Status Notification Given:  Yes(Patient's wife, Jeromey Kruer signed for [facility] C[facility]uste as she is his POA.  12/17/18 Rockne Menghini, [place] )    Tommy Medal 12/17/2018, 1:08 PM

## 2018-12-17 NOTE — Progress Notes (Signed)
Physical Therapy Treatment Patient Details Name: Luis [name] MRN: [id number] DOB: 05/10/1934 Today's Date: 12/17/2018    History of Present Illness The patient is an 85 year old male, he is anticoagulated on Plavix.  Presents with epistaxis from the right nostril.  The patient has had several days of coughing and shortness of breath but today started having epistaxis.  He tried to apply pressure but continues to have a slow and steady drip.  Symptoms are persistent, on my exam he does have some right-sided anterior epistaxis which is well visualized.  He has only a minimal amount of blood in his posterior pharynx.    PT Comments    Patient demonstrates fair/good return for completing BLE ROM/strengthening exercises while seated at bedside with SpO2 at 93-94% while on 3 LPM, slightly increased distance for gait training, but limited secondary to c/o fatigue/SOB with SpO2 dropping to 85% and put back on 4 LPM after returning to room.  Patient tolerated sitting up in chair with his spouse present in room after therapy.  Patient will benefit from continued physical therapy in hospital and recommended venue below to increase strength, balance, endurance for safe ADLs and gait.    Follow Up Recommendations  SNF;Supervision/Assistance - 24 hour;Supervision for mobility/OOB     Equipment Recommendations  None recommended by PT    Recommendations for Other Services       Precautions / Restrictions Precautions Precautions: Fall Restrictions Weight Bearing Restrictions: No    Mobility  Bed Mobility Overal bed mobility: Needs Assistance Bed Mobility: Supine to Sit     Supine to sit: Supervision     General bed mobility comments: head of bed raised and use of siderail  Transfers Overall transfer level: Needs assistance Equipment used: Rolling walker (2 wheeled) Transfers: Sit to/from [facility] Sit to Stand: Min guard Stand pivot transfers: Min guard;Min assist        General transfer comment: labored movement, required repeated verbal cues for proper hand placement during stand to sit  Ambulation/Gait Ambulation/Gait assistance: Min assist Gait Distance (Feet): 70 Feet Assistive device: Rolling walker (2 wheeled) Gait Pattern/deviations: Decreased step length - right;Decreased step length - left;Decreased stride length Gait velocity: decreased   General Gait Details: slightly labored cadence limited mostly due to c/o fatigue and SOB while on 3 LPM with SpO2 dropping to 85%, decreased balance once fatigued   Stairs             Wheelchair Mobility    Modified Rankin (Stroke Patients Only)       Balance Overall balance assessment: Needs assistance Sitting-balance support: No upper extremity supported;Feet supported Sitting balance-Leahy Scale: Good     Standing balance support: During functional activity;Bilateral upper extremity supported Standing balance-Leahy Scale: Fair Standing balance comment: using RW                            Cognition Arousal/Alertness: Awake/alert Behavior During Therapy: WFL for tasks assessed/performed Overall Cognitive Status: Within Functional Limits for tasks assessed                                        Exercises General Exercises - Lower Extremity Long Arc Quad: Seated;Strengthening;AROM;Both;10 reps Hip Flexion/Marching: Seated;AROM;Strengthening;Both;10 reps Toe Raises: Seated;Strengthening;AROM;Both;10 reps Heel Raises: Seated;Strengthening;AROM;Both;10 reps    General Comments        Pertinent Vitals/Pain Pain Assessment:  No/denies pain    Home Living                      Prior Function            PT Goals (current goals can now be found in the care plan section) Acute Rehab PT Goals Patient Stated Goal: return home PT Goal Formulation: With patient/family Time For Goal Achievement: 12/30/18 Potential to Achieve Goals:  Good Progress towards PT goals: Progressing toward goals    Frequency    Min 3X/week      PT Plan Current plan remains appropriate    Co-evaluation              AM-PAC PT "6 Clicks" Mobility   Outcome Measure  Help needed turning from your back to your side while in a flat bed without using bedrails?: None Help needed moving from lying on your back to sitting on the side of a flat bed without using bedrails?: None Help needed moving to and from a bed to a chair (including a wheelchair)?: A Little Help needed standing up from a chair using your arms (e.g., wheelchair or bedside chair)?: A Little Help needed to walk in hospital room?: A Little Help needed climbing 3-5 steps with a railing? : A Lot 6 Click Score: 19    End of Session Equipment Utilized During Treatment: Gait belt;Oxygen Activity Tolerance: Patient tolerated treatment well;Patient limited by fatigue Patient left: in chair;with call bell/phone within reach;with chair alarm set;with family/visitor present Nurse Communication: Mobility status PT Visit Diagnosis: Unsteadiness on feet (R26.81);Other abnormalities of gait and mobility (R26.89);Muscle weakness (generalized) (M62.81)     Time: 6270-3500 PT Time Calculation (min) (ACUTE ONLY): 32 min  Charges:  $Gait Training: 8-22 mins $Therapeutic Exercise: 8-22 mins                     2:15 PM, 12/17/18 Lonell Grandchild, MPT Physical Therapist with [facility] [phone] office [phone] mobile phone

## 2018-12-17 NOTE — Progress Notes (Addendum)
PROGRESS NOTE  Luis [name] FXT:[id number] DOB: 1934/02/27 DOA: 12/16/2018 PCP: Lance Sell, NP  Brief History:  85 year old male with a history of COPD, chronic respiratory failure on 3 L nasal cannula, diabetes mellitus type 2, vascular dementia, hyperlipidemia, hypertension presenting with epistaxis and increasing generalized weakness.  The patient was seen in the emergency department on 12/15/2018 secondary to epistaxis.  The patient underwent cauterization of his nare and was discharged home in stable condition.  Unfortunately, on the morning of 12/16/2018, the patient had recurrent epistaxis prompting him to return to the emergency department.  ED physician applied Neo-Synephrine and obtained hemostasis.  EDP attempted to discharge patient home with family, but the patient's family was concerned about the patient's increasing generalized weakness and debilitation and the wife's inability to care for him.  In addition, the wife also gives a history of increasing shortness of breath over the past 3 to 4 days prior to admission with increasing cough.  There is no fevers, chills, nausea, vomiting, diarrhea, abdominal pain, chest pain.  EDP contacted case manager to help with disposition all possible home health services.  Case manager did not feel that the patient's home situation was safe and recommended transition to a skilled nursing facility.  PT evaluated the patient and recommended skilled nursing facility.  Assessment/Plan: COPD Exacerbation -start pulmicort -start IV solumedrol -start duonebs q 6 -Continue doxycycline--plan 5 days total --PFT2/5/14- severe obstructive airways disease with slight response to bronchodilator. Air-trapping with increased residual volume. Diffusion mildly reduced. Emphysema pattern. FVC 2.56/59%, FEV1 1.17/41%, FEV1/FVC 0.45. Residual volume 132%, DLCO 78%.  Failure to thrive/generalized weakness -The patient has had worsening oral intake and  generalized weakness -At baseline, the patient is able to ambulate with minimal assistance, but has had to use a Rollator in the past week prior to admission. -Serum G99 -TSH -Folic acid -Urinalysis without significant pyuria -PT evaluation--> skilled nursing facility PT/OT evaluations have been performed.  SNF is recommended.  SNF is appropriate as the patient has received 3 days of hospital care (or the 3 day period has bee waived by the patient's insurance company) and is felt to need rehab services to restore this patient to their prior level of function to achieve safe transition back to home care.  This patient needs rehab services for at least 5 days per week and skilled nursing services daily to facilitate this transition.  Rehab is being requested as the most appropriate discharge option for this patient and  NOT felt to be for custodial care as evidenced by his ability to ambulate with minimal assistance prior to admission.  Chronic respiratory failure with hypoxia -Patient is chronically on 3 L nasal cannula  Recurrent epistaxis -Secondary to the patient being on both aspirin and Plavix in the setting of poor humidification with his oxygen -Hemoglobin overall stable -Hemostasis achieved  History of CVA -Patient had stroke 2016 2017 and was taking aspirin and Plavix -This likely contributed to the patient's epistaxis -Discontinue aspirin -Continue Plavix monotherapy for secondary stroke prophylaxis -No history of coronary stents or peripheral arterial disease with stents  Vascular dementia -Continue Aricept and Depakote  Leukocytosis -Likely due to stress demargination -Patient had not started prednisone prior to admission -Overall improved -Patient is afebrile hemodynamically stable  Essential hypertension -Continue amlodipine  Hyperlipidemia -Continue statin  Hypokalemia -repleted -check mag   Disposition Plan:   SNF 1-2 days Family Communication:   Spouse  updated at bedside 3/11  Consultants:  none  Code Status:  FULL  DVT Prophylaxis:  SCDs   Procedures: As Listed in Progress Note Above  Antibiotics: Doxy 3/9>>>   Total time spent 35 minutes.  Greater than 50% spent face to face counseling and coordinating care.    Subjective: Patient continues to have a nonproductive cough.  He denies any hemoptysis.  He continues to have some shortness of breath with exertion but states that it is improving.  He denies any nausea, vomiting, diarrhea, abdominal pain, headache, neck pain.  Objective: Vitals:   12/16/18 1430 12/16/18 1557 12/16/18 2126 12/17/18 0557  BP: (!) 158/72 (!) 152/71 (!) 142/67 (!) 150/78  Pulse: 80 62 63 72  Resp: (!) 30 18 17 18   Temp:  98.1 F (36.7 C) 97.9 F (36.6 C) (!) 97.5 F (36.4 C)  TempSrc:  Oral Oral Oral  SpO2: 96% 100% 99% 94%  Weight:      Height:        Intake/Output Summary (Last 24 hours) at 12/17/2018 1307 Last data filed at 12/17/2018 0810 Gross per 24 hour  Intake 715.33 ml  Output 350 ml  Net 365.33 ml   Weight change:  Exam:   General:  Pt is alert, follows commands appropriately, not in acute distress  HEENT: No icterus, No thrush, No neck mass, [place]/AT  Cardiovascular: RRR, S1/S2, no rubs, no gallops  Respiratory: Diminished breath sounds bilateral.  Bibasilar rales.  No wheezing.  Abdomen: Soft/+BS, non tender, non distended, no guarding  Extremities: No edema, No lymphangitis, No petechiae, No rashes, no synovitis   Data Reviewed: I have personally reviewed following labs and imaging studies Basic Metabolic Panel: Recent Labs  Lab 12/15/18 1655 12/16/18 1138 12/17/18 0512  NA 137 139 140  K 3.5 3.3* 3.6  CL 91* 90* 94*  CO2 36* 38* 38*  GLUCOSE 140* 119* 157*  BUN 18 20 16   CREATININE 0.64 0.71 0.58*  CALCIUM 9.1 9.0 9.1   Liver Function Tests: Recent Labs  Lab 12/15/18 1655 12/16/18 1138  AST 16 16  ALT 13 12  ALKPHOS 72 71  BILITOT 0.9 0.8  PROT  6.7 6.5  ALBUMIN 3.5 3.2*   No results for input(s): LIPASE, AMYLASE in the last 168 hours. No results for input(s): AMMONIA in the last 168 hours. Coagulation Profile: No results for input(s): INR, PROTIME in the last 168 hours. CBC: Recent Labs  Lab 12/15/18 1655 12/16/18 1138 12/17/18 0512  WBC 15.1* 14.2* 9.3  NEUTROABS 13.6* 12.4*  --   HGB 12.6* 12.0* 11.6*  HCT 39.6 38.0* 37.7*  MCV 95.4 95.7 96.9  PLT 232 230 242   Cardiac Enzymes: Recent Labs  Lab 12/16/18 1138  TROPONINI <0.03   BNP: Invalid input(s): POCBNP CBG: No results for input(s): GLUCAP in the last 168 hours. HbA1C: No results for input(s): HGBA1C in the last 72 hours. Urine analysis:    Component Value Date/Time   COLORURINE YELLOW 12/16/2018 1138   APPEARANCEUR HAZY (A) 12/16/2018 1138   LABSPEC 1.024 12/16/2018 1138   PHURINE 5.0 12/16/2018 1138   GLUCOSEU NEGATIVE 12/16/2018 1138   GLUCOSEU NEGATIVE 04/09/2018 1050   HGBUR SMALL (A) 12/16/2018 1138   BILIRUBINUR NEGATIVE 12/16/2018 1138   KETONESUR 5 (A) 12/16/2018 1138   PROTEINUR 30 (A) 12/16/2018 1138   UROBILINOGEN 0.2 04/09/2018 1050   NITRITE NEGATIVE 12/16/2018 1138   LEUKOCYTESUR NEGATIVE 12/16/2018 1138   Sepsis Labs: @LABRCNTIP (procalcitonin:4,lacticidven:4) ) Recent Results (from the past 240 hour(s))  Culture, blood (  Routine X 2) w Reflex to ID Panel     Status: None (Preliminary result)   Collection Time: 12/16/18  8:35 PM  Result Value Ref Range Status   Specimen Description BLOOD LEFT ANTECUBITAL  Final   Special Requests   Final    BOTTLES DRAWN AEROBIC AND ANAEROBIC Blood Culture adequate volume   Culture   Final    NO GROWTH < 12 HOURS Performed at [facility], [address] [place], [place] [address]    Report Status PENDING  Incomplete     Scheduled Meds: . amLODipine  2.5 mg Oral Daily  . atorvastatin  80 mg Oral QPM  . cholecalciferol  5,000 Units Oral q morning - 10a  . clopidogrel  75 mg Oral Daily   . divalproex  250 mg Oral QPM  . donepezil  10 mg Oral q morning - 10a  . doxycycline  100 mg Oral BID  . escitalopram  5 mg Oral Daily  . feeding supplement (ENSURE ENLIVE)  237 mL Oral BID BM  . heparin  5,000 Units Subcutaneous Q8H  . predniSONE  40 mg Oral Daily  . sodium chloride  2 spray Each Nare TID  . sodium chloride flush  3 mL Intravenous Q12H  . tamsulosin  0.4 mg Oral Daily   Continuous Infusions: . sodium chloride    . dextrose 5 % and 0.45% NaCl 40 mL/hr at 12/16/18 3614    Procedures/Studies: Dg Chest 2 View  Result Date: 12/16/2018 CLINICAL DATA:  Shortness of breath.  Cough. EXAM: CHEST - 2 VIEW COMPARISON:  Chest x-rays dated 12/15/2018 and 01/09/2018 and chest CT dated 01/09/2018 FINDINGS: The heart size and pulmonary vascularity are normal. Aortic atherosclerosis. No infiltrates or effusions. The lungs are hyperinflated with flattening of the diaphragm consistent with emphysema. Scarring in the lingula, chronic. No significant bone abnormality. IMPRESSION: 1. No acute abnormalities. 2. Aortic Atherosclerosis (ICD10-I70.0) and Emphysema (ICD10-J43.9). Electronically Signed   By: Lorriane Shire M.D.   On: 12/16/2018 12:16   Dg Chest Port 1 View  Result Date: 12/15/2018 CLINICAL DATA:  Cough.  Nose bleed today. EXAM: PORTABLE CHEST 1 VIEW COMPARISON:  01/09/2018 FINDINGS: Heart size is normal. There is ordinary aortic atherosclerosis. The pulmonary vascularity is normal. The lungs are clear. No effusions. No significant bone finding. IMPRESSION: No active disease. Electronically Signed   By: Nelson Chimes M.D.   On: 12/15/2018 17:31    Orson Eva, DO  [facility] Pager [phone]  If 7PM-7AM, please contact night-coverage [email or web address] Password [email or web address] 12/17/2018, 1:07 PM   LOS: 0 days

## 2018-12-18 DIAGNOSIS — Z66 Do not resuscitate: Secondary | ICD-10-CM | POA: Diagnosis not present

## 2018-12-18 DIAGNOSIS — T45525A Adverse effect of antithrombotic drugs, initial encounter: Secondary | ICD-10-CM | POA: Diagnosis present

## 2018-12-18 DIAGNOSIS — E785 Hyperlipidemia, unspecified: Secondary | ICD-10-CM | POA: Diagnosis present

## 2018-12-18 DIAGNOSIS — Z7189 Other specified counseling: Secondary | ICD-10-CM | POA: Diagnosis not present

## 2018-12-18 DIAGNOSIS — J9611 Chronic respiratory failure with hypoxia: Secondary | ICD-10-CM | POA: Diagnosis present

## 2018-12-18 DIAGNOSIS — Z823 Family history of stroke: Secondary | ICD-10-CM | POA: Diagnosis not present

## 2018-12-18 DIAGNOSIS — J439 Emphysema, unspecified: Secondary | ICD-10-CM | POA: Diagnosis present

## 2018-12-18 DIAGNOSIS — E440 Moderate protein-calorie malnutrition: Secondary | ICD-10-CM

## 2018-12-18 DIAGNOSIS — E119 Type 2 diabetes mellitus without complications: Secondary | ICD-10-CM | POA: Diagnosis present

## 2018-12-18 DIAGNOSIS — F0150 Vascular dementia without behavioral disturbance: Secondary | ICD-10-CM | POA: Diagnosis present

## 2018-12-18 DIAGNOSIS — D72828 Other elevated white blood cell count: Secondary | ICD-10-CM | POA: Diagnosis present

## 2018-12-18 DIAGNOSIS — Z79899 Other long term (current) drug therapy: Secondary | ICD-10-CM | POA: Diagnosis not present

## 2018-12-18 DIAGNOSIS — I69319 Unspecified symptoms and signs involving cognitive functions following cerebral infarction: Secondary | ICD-10-CM | POA: Diagnosis not present

## 2018-12-18 DIAGNOSIS — T39015A Adverse effect of aspirin, initial encounter: Secondary | ICD-10-CM | POA: Diagnosis present

## 2018-12-18 DIAGNOSIS — Z87891 Personal history of nicotine dependence: Secondary | ICD-10-CM | POA: Diagnosis not present

## 2018-12-18 DIAGNOSIS — T380X5A Adverse effect of glucocorticoids and synthetic analogues, initial encounter: Secondary | ICD-10-CM | POA: Diagnosis present

## 2018-12-18 DIAGNOSIS — R040 Epistaxis: Secondary | ICD-10-CM | POA: Diagnosis present

## 2018-12-18 DIAGNOSIS — Z7982 Long term (current) use of aspirin: Secondary | ICD-10-CM | POA: Diagnosis not present

## 2018-12-18 DIAGNOSIS — Z9981 Dependence on supplemental oxygen: Secondary | ICD-10-CM | POA: Diagnosis not present

## 2018-12-18 DIAGNOSIS — J441 Chronic obstructive pulmonary disease with (acute) exacerbation: Secondary | ICD-10-CM | POA: Diagnosis not present

## 2018-12-18 DIAGNOSIS — Z888 Allergy status to other drugs, medicaments and biological substances status: Secondary | ICD-10-CM | POA: Diagnosis not present

## 2018-12-18 DIAGNOSIS — E876 Hypokalemia: Secondary | ICD-10-CM | POA: Diagnosis not present

## 2018-12-18 DIAGNOSIS — Z7902 Long term (current) use of antithrombotics/antiplatelets: Secondary | ICD-10-CM | POA: Diagnosis not present

## 2018-12-18 DIAGNOSIS — R627 Adult failure to thrive: Secondary | ICD-10-CM | POA: Diagnosis present

## 2018-12-18 DIAGNOSIS — I10 Essential (primary) hypertension: Secondary | ICD-10-CM | POA: Diagnosis present

## 2018-12-18 DIAGNOSIS — Z6823 Body mass index (BMI) 23.0-23.9, adult: Secondary | ICD-10-CM | POA: Diagnosis not present

## 2018-12-18 LAB — BASIC METABOLIC PANEL
Anion gap: 8 (ref 5–15)
BUN: 23 mg/dL (ref 8–23)
CHLORIDE: 90 mmol/L — AB (ref 98–111)
CO2: 39 mmol/L — ABNORMAL HIGH (ref 22–32)
Calcium: 8.8 mg/dL — ABNORMAL LOW (ref 8.9–10.3)
Creatinine, Ser: 0.78 mg/dL (ref 0.61–1.24)
GFR calc Af Amer: >60 mL/min (ref >60)
GFR calc non Af Amer: >60 mL/min (ref >60)
Glucose, Bld: 157 mg/dL — ABNORMAL HIGH (ref 70–99)
Potassium: 3.8 mmol/L (ref 3.5–5.1)
Sodium: 137 mmol/L (ref 135–145)

## 2018-12-18 LAB — MAGNESIUM: Magnesium: 2 mg/dL (ref 1.7–2.4)

## 2018-12-18 LAB — FOLATE: Folate: 10.8 ng/mL (ref >5.9)

## 2018-12-18 LAB — T4, FREE: Free T4: 1.05 ng/dL (ref 0.82–1.77)

## 2018-12-18 LAB — TSH: TSH: 0.427 u[IU]/mL (ref 0.350–4.500)

## 2018-12-18 LAB — VITAMIN B12: Vitamin B-12: 546 pg/mL (ref 180–914)

## 2018-12-18 NOTE — Progress Notes (Signed)
PROGRESS NOTE  Luis [name] NIO:[id number] DOB: [birth year] DOA: 12/16/2018 PCP: Lance Sell, NP   Brief History:  85 year old male with a history of COPD, chronic respiratory failure on 3 L nasal cannula, diabetes mellitus type 2, vascular dementia, hyperlipidemia, hypertension presenting with epistaxis and increasing generalized weakness.  The patient was seen in the emergency department on 12/15/2018 secondary to epistaxis.  The patient underwent cauterization of his nare and was discharged home in stable condition.  Unfortunately, on the morning of 12/16/2018, the patient had recurrent epistaxis prompting him to return to the emergency department.  ED physician applied Neo-Synephrine and obtained hemostasis.  EDP attempted to discharge patient home with family, but the patient's family was concerned about the patient's increasing generalized weakness and debilitation and the wife's inability to care for him.  In addition, the wife also gives a history of increasing shortness of breath over the past 3 to 4 days prior to admission with increasing cough.  There is no fevers, chills, nausea, vomiting, diarrhea, abdominal pain, chest pain.  EDP contacted case manager to help with disposition all possible home health services.  Case manager did not feel that the patient's home situation was safe and recommended transition to a skilled nursing facility.  PT evaluated the patient and recommended skilled nursing facility.  Assessment/Plan: COPD Exacerbation -Continue pulmicort -Continue IV solumedrol -Conitnue duonebs q 6 -Continue doxycycline--plan 5 days total --PFT2/5/14- severe obstructive airways disease with slight response to bronchodilator. Air-trapping with increased residual volume. Diffusion mildly reduced. Emphysema pattern. FVC 2.56/59%, FEV1 1.17/41%, FEV1/FVC 0.45. Residual volume 132%, DLCO 78%.  Failure to thrive/generalized weakness -The patient has had worsening  oral intake and generalized weakness -At baseline, the patient is able to ambulate with minimal assistance, but has had to use a Rollator in the past week prior to admission. -Serum G18--299 -BZJ--6.967 -Folic ELFY--10.1 -Urinalysis without significant pyuria -PT evaluation--> skilled nursing facility PT/OT evaluations have been performed.  SNF is recommended.  SNF is appropriate as the patient has received 3 days of hospital care (or the 3 day period has bee waived by the patient's insurance company) and is felt to need rehab services to restore this patient to their prior level of function to achieve safe transition back to home care.  This patient needs rehab services for at least 5 days per week and skilled nursing services daily to facilitate this transition.  Rehab is being requested as the most appropriate discharge option for this patient and  NOT felt to be for custodial care as evidenced by his ability to ambulate with minimal assistance prior to admission.  Chronic respiratory failure with hypoxia -Patient is chronically on 3 L nasal cannula  Recurrent epistaxis -Secondary to the patient being on both aspirin and Plavix in the setting of poor humidification with his oxygen -Hemoglobin overall stable -Hemostasis achieved  History of CVA -Patient had stroke 2016 and was taking aspirin and Plavix -This likely contributed to the patient's epistaxis -Discontinue aspirin -Continue Plavix monotherapy for secondary stroke prophylaxis -No history of coronary stents or peripheral arterial disease with stents  Vascular dementia -Continue Aricept and Depakote  Leukocytosis -Likely due to stress demargination -Patient had not started prednisone prior to admission -Overall improved -Patient is afebrile hemodynamically stable  Essential hypertension -Continue amlodipine  Hyperlipidemia -Continue statin  Hypokalemia -repleted -check mag--2.0  Moderate  malnutrition -continue supplements   Disposition Plan:   SNF 3/13 if insurance authorizes Family Communication:  Spouse updated at bedside 3/11  Consultants:  none  Code Status:  FULL  DVT Prophylaxis:  SCDs   Procedures: As Listed in Progress Note Above  Antibiotics: Doxy 3/9>>>    Subjective: Patient denies fevers, chills, headache, chest pain, dyspnea, nausea, vomiting, diarrhea, abdominal pain, dysuria, hematuria, hematochezia, and melena.   Objective: Vitals:   12/17/18 2147 12/18/18 0131 12/18/18 0627 12/18/18 1426  BP: (!) 142/76  140/62 (!) 145/66  Pulse: 70  (!) 52 (!) 56  Resp: 18  16 18   Temp: 97.8 F (36.6 C)  97.7 F (36.5 C) 98.8 F (37.1 C)  TempSrc: Oral  Oral Oral  SpO2: 96% (!) 86% 99% 100%  Weight:      Height:        Intake/Output Summary (Last 24 hours) at 12/18/2018 1834 Last data filed at 12/18/2018 1300 Gross per 24 hour  Intake 480 ml  Output 100 ml  Net 380 ml   Weight change:  Exam:   General:  Pt is alert, follows commands appropriately, not in acute distress  HEENT: No icterus, No thrush, No neck mass, /AT  Cardiovascular: RRR, S1/S2, no rubs, no gallops  Respiratory: bibasilar rales, diminished breath sounds  Abdomen: Soft/+BS, non tender, non distended, no guarding  Extremities: No edema, No lymphangitis, No petechiae, No rashes, no synovitis   Data Reviewed: I have personally reviewed following labs and imaging studies Basic Metabolic Panel: Recent Labs  Lab 12/15/18 1655 12/16/18 1138 12/17/18 0512 12/18/18 0518  NA 137 139 140 137  K 3.5 3.3* 3.6 3.8  CL 91* 90* 94* 90*  CO2 36* 38* 38* 39*  GLUCOSE 140* 119* 157* 157*  BUN 18 20 16 23   CREATININE 0.64 0.71 0.58* 0.78  CALCIUM 9.1 9.0 9.1 8.8*  MG  --   --   --  2.0   Liver Function Tests: Recent Labs  Lab 12/15/18 1655 12/16/18 1138  AST 16 16  ALT 13 12  ALKPHOS 72 71  BILITOT 0.9 0.8  PROT 6.7 6.5  ALBUMIN 3.5 3.2*   No  results for input(s): LIPASE, AMYLASE in the last 168 hours. No results for input(s): AMMONIA in the last 168 hours. Coagulation Profile: No results for input(s): INR, PROTIME in the last 168 hours. CBC: Recent Labs  Lab 12/15/18 1655 12/16/18 1138 12/17/18 0512  WBC 15.1* 14.2* 9.3  NEUTROABS 13.6* 12.4*  --   HGB 12.6* 12.0* 11.6*  HCT 39.6 38.0* 37.7*  MCV 95.4 95.7 96.9  PLT 232 230 242   Cardiac Enzymes: Recent Labs  Lab 12/16/18 1138  TROPONINI <0.03   BNP: Invalid input(s): POCBNP CBG: No results for input(s): GLUCAP in the last 168 hours. HbA1C: No results for input(s): HGBA1C in the last 72 hours. Urine analysis:    Component Value Date/Time   COLORURINE YELLOW 12/16/2018 1138   APPEARANCEUR HAZY (A) 12/16/2018 1138   LABSPEC 1.024 12/16/2018 1138   PHURINE 5.0 12/16/2018 1138   GLUCOSEU NEGATIVE 12/16/2018 1138   GLUCOSEU NEGATIVE 04/09/2018 1050   HGBUR SMALL (A) 12/16/2018 1138   BILIRUBINUR NEGATIVE 12/16/2018 1138   KETONESUR 5 (A) 12/16/2018 1138   PROTEINUR 30 (A) 12/16/2018 1138   UROBILINOGEN 0.2 04/09/2018 1050   NITRITE NEGATIVE 12/16/2018 1138   LEUKOCYTESUR NEGATIVE 12/16/2018 1138   Sepsis Labs: @LABRCNTIP (procalcitonin:4,lacticidven:4) ) Recent Results (from the past 240 hour(s))  Culture, blood (Routine X 2) w Reflex to ID Panel     Status: None (Preliminary result)   Collection  Time: 12/16/18  8:35 PM  Result Value Ref Range Status   Specimen Description BLOOD LEFT ANTECUBITAL  Final   Special Requests   Final    BOTTLES DRAWN AEROBIC AND ANAEROBIC Blood Culture adequate volume   Culture   Final    NO GROWTH 2 DAYS Performed at [facility], [address], [place], [place] [address]    Report Status PENDING  Incomplete     Scheduled Meds:  amLODipine  2.5 mg Oral Daily   atorvastatin  80 mg Oral QPM   budesonide (PULMICORT) nebulizer solution  0.5 mg Nebulization BID   cholecalciferol  5,000 Units Oral q morning - 10a    clopidogrel  75 mg Oral Daily   divalproex  250 mg Oral QPM   donepezil  10 mg Oral q morning - 10a   doxycycline  100 mg Oral BID   escitalopram  5 mg Oral Daily   feeding supplement (ENSURE ENLIVE)  237 mL Oral BID BM   heparin  5,000 Units Subcutaneous Q8H   ipratropium-albuterol  3 mL Nebulization Q6H   methylPREDNISolone (SOLU-MEDROL) injection  40 mg Intravenous BID   multivitamin-lutein  1 capsule Oral Daily   sodium chloride  2 spray Each Nare TID   sodium chloride flush  3 mL Intravenous Q12H   tamsulosin  0.4 mg Oral Daily   Continuous Infusions:  sodium chloride     dextrose 5 % and 0.45% NaCl 40 mL/hr at 12/16/18 1834    Procedures/Studies: Dg Chest 2 View  Result Date: 12/16/2018 CLINICAL DATA:  Shortness of breath.  Cough. EXAM: CHEST - 2 VIEW COMPARISON:  Chest x-rays dated 12/15/2018 and 01/09/2018 and chest CT dated 01/09/2018 FINDINGS: The heart size and pulmonary vascularity are normal. Aortic atherosclerosis. No infiltrates or effusions. The lungs are hyperinflated with flattening of the diaphragm consistent with emphysema. Scarring in the lingula, chronic. No significant bone abnormality. IMPRESSION: 1. No acute abnormalities. 2. Aortic Atherosclerosis (ICD10-I70.0) and Emphysema (ICD10-J43.9). Electronically Signed   By: Lorriane Shire M.D.   On: 12/16/2018 12:16   Dg Chest Port 1 View  Result Date: 12/15/2018 CLINICAL DATA:  Cough.  Nose bleed today. EXAM: PORTABLE CHEST 1 VIEW COMPARISON:  01/09/2018 FINDINGS: Heart size is normal. There is ordinary aortic atherosclerosis. The pulmonary vascularity is normal. The lungs are clear. No effusions. No significant bone finding. IMPRESSION: No active disease. Electronically Signed   By: Nelson Chimes M.D.   On: 12/15/2018 17:31    Orson Eva, DO  [facility] Pager [phone]  If 7PM-7AM, please contact night-coverage [email or web address] Password [facility] 12/18/2018, 6:34 PM   LOS: 0 days

## 2018-12-19 LAB — GLUCOSE, CAPILLARY: Glucose-Capillary: 126 mg/dL — ABNORMAL HIGH (ref 70–99)

## 2018-12-19 MED ORDER — PREDNISONE 20 MG PO TABS
50.0000 mg | ORAL_TABLET | Freq: Every day | ORAL | Status: DC
  Administered 2018-12-20 – 2018-12-22 (×3): 50 mg via ORAL
  Filled 2018-12-19 (×3): qty 1

## 2018-12-19 NOTE — TOC Progression Note (Signed)
Transition of Care [facility]) - Progression Note    Patient Details  Name: Luis [name] MRN: [id number] Date of Birth: 04-14-1934  [facility] of [facility]) CM/SW Contact  Ihor Gully, LCSW Phone Number: 12/19/2018, 3:46 PM  Clinical Narrative:    LCSW advised family that patient had been denied and that a peer:peer was required. Advised that if patient/family was authorized after peer to peer had been held, additional documentation sent in. Awaiting peer to peer decision.      Expected Discharge Plan: Skilled Nursing Facility Barriers to Discharge: No Barriers Identified  Expected Discharge Plan and Services Expected Discharge Plan: [place] Choice: [place] Living arrangements for the past 2 months: Single Family Home                           Social Determinants of Health (SDOH) Interventions    Readmission Risk Interventions 30 Day Unplanned Readmission Risk Score     ED to Hosp-Admission (Current) from 12/16/2018 in [place]  30 Day Unplanned Readmission Risk Score (%)  20 Filed at 12/19/2018 1200     This score is the patient's risk of an unplanned readmission within 30 days of being discharged (0 -100%). The score is based on dignosis, age, lab data, medications, orders, and past utilization.   Low:  0-14.9   Medium: 15-21.9   High: 22-29.9   Extreme: 30 and above       No flowsheet data found.

## 2018-12-19 NOTE — Progress Notes (Signed)
Patient moved closer to desk.  Patient is confused in room and fall risk.  Moved closer to desk for safety of patient and easier for staff to assist patient in timely manner.

## 2018-12-19 NOTE — Care Management Important Message (Signed)
Important Message  Patient Details  Name: Luis [name] MRN: [id number] Date of Birth: 19-Mar-1934   Medicare Important Message Given:  Yes    Tommy Medal 12/19/2018, 3:02 PM

## 2018-12-19 NOTE — Progress Notes (Addendum)
PROGRESS NOTE  Luis [name] HWE:[id number] DOB: 1934-03-08 DOA: 12/16/2018 PCP: Lance Sell, NP  Brief History: 85 year old male with a history of COPD, chronic respiratory failure on 3 L nasal cannula, diabetes mellitus type 2, vascular dementia, hyperlipidemia, hypertension presenting with epistaxis and increasing generalized weakness. The patient was seen in the emergency department on 12/15/2018 secondary to epistaxis. The patient underwent cauterization of his nare and was discharged home in stable condition. Unfortunately, on the morning of 12/16/2018, the patient had recurrent epistaxis prompting him to return to the emergency department. ED physician applied Neo-Synephrine and obtained hemostasis.EDP attempted to discharge patient home with family, but the patient's family was concerned about the patient's increasing generalized weakness and debilitation and the wife's inability to care for him. In addition, the wife also gives a history of increasing shortness of breath over the past 3 to 4 days prior to admission with increasing cough. There is no fevers, chills, nausea, vomiting, diarrhea, abdominal pain, chest pain. EDP contacted case manager to help with disposition all possible home health services. Case manager did not feel that the patient's home situation was safe and recommended transition to a skilled nursing facility. PT evaluated the patient and recommended skilled nursing facility.  Prior to admission, patient's wife stated that patient was able to ambulate with minimal assist, only occasionally using the walls or furniture to steady himself.  However, in the past 1-2 weeks, the patient has gotten weaker to point of needing the use of a rollator walker to get around.  After discussion with patient family/spouse, it was felt that given functional decline and increase need for assistance that it will not be a safe discharge situation.  Therefore, spouse and  family have requested short term rehab in SNF to help restore his functional status.  Assessment/Plan: COPD Exacerbation -Continue pulmicort -Continue IV solumedrol -Conitnue duonebs q 6 -Continue doxycycline--day 4/5  --PFT2/5/14- severe obstructive airways disease with slight response to bronchodilator. Air-trapping with increased residual volume. Diffusion mildly reduced. Emphysema pattern. FVC 2.56/59%, FEV1 1.17/41%, FEV1/FVC 0.45. Residual volume 132%, DLCO 78%.  Failure to thrive/generalized weakness -The patient has had worsening oral intake and generalized weakness -At baseline, the patient is able to ambulate with minimal assistance, but has had to use a Rollator in the past week prior to admission. -Serum E93--810 -FBP--1.025 -Folic ENID--78.2 -Urinalysis without significant pyuria -PT evaluation-->skilled nursing facility PT/OT evaluations have been performed. SNF is recommended. SNF is appropriate as the patient has received 3 days of hospital care (or the 3 day period has bee waived by the patient's insurance company) and is felt to need rehab services to restore this patient to their prior level of function to achieve safe transition back to home care. This patient needs rehab services for at least 5 days per week and skilled nursing services daily to facilitate this transition. Rehab is being requested as the most appropriate discharge option for this patient and NOT felt to be for custodial care as evidenced by his ability to ambulate with minimal assistance prior to admission.  Chronic respiratory failure with hypoxia -Patient is chronically on 2-3 L nasal cannula -pt initially required 4 L [place], now back to 3L  Recurrent epistaxis -Secondary to the patient being on both aspirin and Plavix in the setting of poor humidification with his oxygen -Hemoglobin overall stable -Hemostasis achieved  History of CVA -Patient had stroke 2016 and was taking aspirin and  Plavix -This likely  contributed to the patient's epistaxis -Discontinue aspirin -Continue Plavix monotherapy for secondary stroke prophylaxis -No history of coronary stents or peripheral arterial disease with stents  Vascular dementia -Continue Aricept and Depakote -no behavioral disturbance noted during this admission  Leukocytosis -Likely due to stress demargination -Patient had not started prednisone prior to admission -Overall improved -Patient is afebrile hemodynamically stable  Essential hypertension -Continue amlodipine  Hyperlipidemia -Continue statin  Hypokalemia -repleted -check mag--2.0  Moderate malnutrition -continue supplements   Disposition Plan:SNF once insurance authorizes Family Communication:Spouse updatedat bedside 3/12  Consultants:none  Code Status: FULL  DVT Prophylaxis: SCDs   Procedures: As Listed in Progress Note Above  Antibiotics: Doxy 3/9>>>     Subjective: Pt is breathing better.  Patient denies fevers, chills, headache, chest pain, dyspnea, nausea, vomiting, diarrhea, abdominal pain, dysuria, hematuria, hematochezia, and melena. Still has nonproductive cough  Objective: Vitals:   12/18/18 2051 12/18/18 2113 12/19/18 0603 12/19/18 0743  BP:  137/69 (!) 144/85   Pulse:  (!) 120 (!) 47   Resp:   18   Temp:  98.1 F (36.7 C) (!) 97.5 F (36.4 C)   TempSrc:  Oral Oral   SpO2: 97% 94% 100% 100%  Weight:      Height:        Intake/Output Summary (Last 24 hours) at 12/19/2018 1438 Last data filed at 12/19/2018 0934 Gross per 24 hour  Intake 3 ml  Output 2450 ml  Net -2447 ml   Weight change:  Exam:   General:  Pt is alert, follows commands appropriately, not in acute distress  HEENT: No icterus, No thrush, No neck mass, [place]/AT  Cardiovascular: RRR, S1/S2, no rubs, no gallops  Respiratory:diminished breath sounds. No wheeze. Bibasilar rales  Abdomen: Soft/+BS, non tender, non distended,  no guarding  Extremities: No edema, No lymphangitis, No petechiae, No rashes, no synovitis   Data Reviewed: I have personally reviewed following labs and imaging studies Basic Metabolic Panel: Recent Labs  Lab 12/15/18 1655 12/16/18 1138 12/17/18 0512 12/18/18 0518  NA 137 139 140 137  K 3.5 3.3* 3.6 3.8  CL 91* 90* 94* 90*  CO2 36* 38* 38* 39*  GLUCOSE 140* 119* 157* 157*  BUN 18 20 16 23   CREATININE 0.64 0.71 0.58* 0.78  CALCIUM 9.1 9.0 9.1 8.8*  MG  --   --   --  2.0   Liver Function Tests: Recent Labs  Lab 12/15/18 1655 12/16/18 1138  AST 16 16  ALT 13 12  ALKPHOS 72 71  BILITOT 0.9 0.8  PROT 6.7 6.5  ALBUMIN 3.5 3.2*   No results for input(s): LIPASE, AMYLASE in the last 168 hours. No results for input(s): AMMONIA in the last 168 hours. Coagulation Profile: No results for input(s): INR, PROTIME in the last 168 hours. CBC: Recent Labs  Lab 12/15/18 1655 12/16/18 1138 12/17/18 0512  WBC 15.1* 14.2* 9.3  NEUTROABS 13.6* 12.4*  --   HGB 12.6* 12.0* 11.6*  HCT 39.6 38.0* 37.7*  MCV 95.4 95.7 96.9  PLT 232 230 242   Cardiac Enzymes: Recent Labs  Lab 12/16/18 1138  TROPONINI <0.03   BNP: Invalid input(s): POCBNP CBG: No results for input(s): GLUCAP in the last 168 hours. HbA1C: No results for input(s): HGBA1C in the last 72 hours. Urine analysis:    Component Value Date/Time   COLORURINE YELLOW 12/16/2018 1138   APPEARANCEUR HAZY (A) 12/16/2018 1138   LABSPEC 1.024 12/16/2018 1138   PHURINE 5.0 12/16/2018 1138   GLUCOSEU  NEGATIVE 12/16/2018 1138   GLUCOSEU NEGATIVE 04/09/2018 1050   HGBUR SMALL (A) 12/16/2018 1138   BILIRUBINUR NEGATIVE 12/16/2018 1138   KETONESUR 5 (A) 12/16/2018 1138   PROTEINUR 30 (A) 12/16/2018 1138   UROBILINOGEN 0.2 04/09/2018 1050   NITRITE NEGATIVE 12/16/2018 1138   LEUKOCYTESUR NEGATIVE 12/16/2018 1138   Sepsis Labs: @LABRCNTIP (procalcitonin:4,lacticidven:4) ) Recent Results (from the past 240 hour(s))    Culture, blood (Routine X 2) w Reflex to ID Panel     Status: None (Preliminary result)   Collection Time: 12/16/18  8:35 PM  Result Value Ref Range Status   Specimen Description BLOOD LEFT ANTECUBITAL  Final   Special Requests   Final    BOTTLES DRAWN AEROBIC AND ANAEROBIC Blood Culture adequate volume   Culture   Final    NO GROWTH 3 DAYS Performed at [facility], [address], [place], [place] [address]    Report Status PENDING  Incomplete     Scheduled Meds:  amLODipine  2.5 mg Oral Daily   atorvastatin  80 mg Oral QPM   budesonide (PULMICORT) nebulizer solution  0.5 mg Nebulization BID   cholecalciferol  5,000 Units Oral q morning - 10a   clopidogrel  75 mg Oral Daily   divalproex  250 mg Oral QPM   donepezil  10 mg Oral q morning - 10a   doxycycline  100 mg Oral BID   escitalopram  5 mg Oral Daily   feeding supplement (ENSURE ENLIVE)  237 mL Oral BID BM   heparin  5,000 Units Subcutaneous Q8H   methylPREDNISolone (SOLU-MEDROL) injection  40 mg Intravenous BID   multivitamin-lutein  1 capsule Oral Daily   sodium chloride  2 spray Each Nare TID   sodium chloride flush  3 mL Intravenous Q12H   tamsulosin  0.4 mg Oral Daily   Continuous Infusions:  sodium chloride     dextrose 5 % and 0.45% NaCl 40 mL/hr at 12/16/18 1829    Procedures/Studies: Dg Chest 2 View  Result Date: 12/16/2018 CLINICAL DATA:  Shortness of breath.  Cough. EXAM: CHEST - 2 VIEW COMPARISON:  Chest x-rays dated 12/15/2018 and 01/09/2018 and chest CT dated 01/09/2018 FINDINGS: The heart size and pulmonary vascularity are normal. Aortic atherosclerosis. No infiltrates or effusions. The lungs are hyperinflated with flattening of the diaphragm consistent with emphysema. Scarring in the lingula, chronic. No significant bone abnormality. IMPRESSION: 1. No acute abnormalities. 2. Aortic Atherosclerosis (ICD10-I70.0) and Emphysema (ICD10-J43.9). Electronically Signed   By: Lorriane Shire M.D.    On: 12/16/2018 12:16   Dg Chest Port 1 View  Result Date: 12/15/2018 CLINICAL DATA:  Cough.  Nose bleed today. EXAM: PORTABLE CHEST 1 VIEW COMPARISON:  01/09/2018 FINDINGS: Heart size is normal. There is ordinary aortic atherosclerosis. The pulmonary vascularity is normal. The lungs are clear. No effusions. No significant bone finding. IMPRESSION: No active disease. Electronically Signed   By: Nelson Chimes M.D.   On: 12/15/2018 17:31    Orson Eva, DO  [facility] Pager [phone]  If 7PM-7AM, please contact night-coverage [email or web address] Password [email or web address] 12/19/2018, 2:38 PM   LOS: 1 day

## 2018-12-19 NOTE — Progress Notes (Signed)
Physical Therapy Treatment Patient Details Name: Luis [name] MRN: [id number] DOB: 07-12-1934 Today's Date: 12/19/2018    History of Present Illness The patient is an 85 year old male, he is anticoagulated on Plavix.  Presents with epistaxis from the right nostril.  The patient has had several days of coughing and shortness of breath but today started having epistaxis.  He tried to apply pressure but continues to have a slow and steady drip.  Symptoms are persistent, on my exam he does have some right-sided anterior epistaxis which is well visualized.  He has only a minimal amount of blood in his posterior pharynx.    PT Comments    Patient demonstrates good return for sitting up at bedside with head of bed raised, required frequent rest breaks while completing BLE ROM/strengthening exercises while seated at bedside, slow labored cadence during gait training without loss of balance, limited secondary to c/o fatigue and mild SOB and tolerated sitting up in chair after therapy with his spouse present in room.  Patient will benefit from continued physical therapy in hospital and recommended venue below to increase strength, balance, endurance for safe ADLs and gait.    Follow Up Recommendations  SNF;Supervision/Assistance - 24 hour;Supervision for mobility/OOB     Equipment Recommendations  None recommended by PT    Recommendations for Other Services       Precautions / Restrictions Precautions Precautions: Fall Restrictions Weight Bearing Restrictions: No    Mobility  Bed Mobility Overal bed mobility: Needs Assistance Bed Mobility: Supine to Sit     Supine to sit: Supervision     General bed mobility comments: head of bed raised  Transfers Overall transfer level: Needs assistance Equipment used: Rolling walker (2 wheeled) Transfers: Sit to/from [facility] Sit to Stand: Min guard Stand pivot transfers: Min guard       General transfer comment: unsteady  labored movement  Ambulation/Gait Ambulation/Gait assistance: Herbalist (Feet): 65 Feet Assistive device: Rolling walker (2 wheeled) Gait Pattern/deviations: Decreased step length - right;Decreased step length - left;Decreased stride length Gait velocity: decreased   General Gait Details: slightly labored unsteady cadence without loss of balance, on 4 LPM   Stairs             Wheelchair Mobility    Modified Rankin (Stroke Patients Only)       Balance Overall balance assessment: Needs assistance Sitting-balance support: Feet supported;No upper extremity supported Sitting balance-Leahy Scale: Good     Standing balance support: Bilateral upper extremity supported;During functional activity Standing balance-Leahy Scale: Fair Standing balance comment: using RW                            Cognition Arousal/Alertness: Awake/alert Behavior During Therapy: WFL for tasks assessed/performed Overall Cognitive Status: Within Functional Limits for tasks assessed                                        Exercises General Exercises - Lower Extremity Long Arc Quad: Seated;AROM;Both;15 reps;Strengthening Toe Raises: Seated;Strengthening;Both;15 reps;AROM Heel Raises: Seated;AROM;Strengthening;Both;15 reps    General Comments        Pertinent Vitals/Pain Pain Assessment: No/denies pain    Home Living                      Prior Function  PT Goals (current goals can now be found in the care plan section) Acute Rehab PT Goals Patient Stated Goal: return home PT Goal Formulation: With patient/family Time For Goal Achievement: 12/30/18 Potential to Achieve Goals: Good Progress towards PT goals: Progressing toward goals    Frequency    Min 3X/week      PT Plan Current plan remains appropriate    Co-evaluation              AM-PAC PT "6 Clicks" Mobility   Outcome Measure  Help needed turning  from your back to your side while in a flat bed without using bedrails?: None Help needed moving from lying on your back to sitting on the side of a flat bed without using bedrails?: None Help needed moving to and from a bed to a chair (including a wheelchair)?: A Little Help needed standing up from a chair using your arms (e.g., wheelchair or bedside chair)?: A Little Help needed to walk in hospital room?: A Little Help needed climbing 3-5 steps with a railing? : A Lot 6 Click Score: 19    End of Session Equipment Utilized During Treatment: Oxygen Activity Tolerance: Patient tolerated treatment well;Patient limited by fatigue Patient left: in chair;with call bell/phone within reach;with family/visitor present Nurse Communication: Mobility status PT Visit Diagnosis: Unsteadiness on feet (R26.81);Other abnormalities of gait and mobility (R26.89);Muscle weakness (generalized) (M62.81)     Time: 1533-1600 PT Time Calculation (min) (ACUTE ONLY): 27 min  Charges:  $Gait Training: 8-22 mins $Therapeutic Exercise: 8-22 mins                     4:06 PM, 12/19/18 Lonell Grandchild, MPT Physical Therapist with [facility] [phone] office [phone] mobile phone

## 2018-12-20 LAB — GLUCOSE, CAPILLARY
GLUCOSE-CAPILLARY: 130 mg/dL — AB (ref 70–99)
GLUCOSE-CAPILLARY: 211 mg/dL — AB (ref 70–99)
Glucose-Capillary: 121 mg/dL — ABNORMAL HIGH (ref 70–99)
Glucose-Capillary: 184 mg/dL — ABNORMAL HIGH (ref 70–99)

## 2018-12-20 NOTE — Progress Notes (Signed)
PROGRESS NOTE  Luis [name] MVH:[id number] DOB: Jul 14, 1934 DOA: 12/16/2018 PCP: Lance Sell, NP  HPI/Recap of past 41 hours: 85 year old male with COPD chronic respiratory failure on 3 L/min nasal cannula at home type 2 diabetes mellitus vascular dementia hyperlipidemia who was admitted with epistaxis and increased generalized weakness.  Patient seen at bedside he is sitting in a chair his recliner chair.  Family is at bedside  Assessment/Plan: Principal Problem:   Frequent nosebleeds Active Problems:   COPD exacerbation (HCC)   Essential hypertension   CVA (cerebral vascular accident) ([place])   Chronic respiratory failure with hypoxia ([place])   History of stroke   Vascular dementia without behavioral disturbance ([place])   COPD with acute exacerbation (HCC)   Malnutrition of moderate degree  1.  COPD.  Continue Pulmicort IV Solu-Medrol and duo nebs as well as doxycycline  2.  Failure to thrive with generalized weakness continue PT and OT  3.  Chronic respiratory failure on home O2 patient currently requiring 4 L/min  4.  Recurrent epistaxis patient is on aspirin and Plavix.  His hemoglobin is stable he is no longer bleeding  5.  History of CVA his aspirin has been discontinued but he will continue the Plavix  6.  Dementia vascular dementia we will continue Aricept and Depakote  Code Status: Full  Family Communication: Wife at bedside  Disposition Plan: Waiting for insurance approval for SNF   Consultants:  None  Procedures:  None  Antimicrobials:  Doxycycline  DVT prophylaxis: SCD   Objective: Vitals:   12/20/18 1448 12/20/18 1949  BP: (!) 159/73   Pulse: (!) 46   Resp: 18   Temp:    SpO2: 92% 94%    Intake/Output Summary (Last 24 hours) at 12/20/2018 2034 Last data filed at 12/20/2018 1808 Gross per 24 hour  Intake 959 ml  Output 1050 ml  Net -91 ml   Filed Weights   12/16/18 1001  Weight: 77 kg   Body mass index is 23.02 kg/m.   Exam:   General: Alert oriented to person frail looking  Cardiovascular: Heart rate regular rate and rhythm  Respiratory: O2 nasal cannula in use at 3 L to 4 L/min mild respiratory distress at rest  Abdomen: Soft nontender  Musculoskeletal: No lower extremity edema  Skin: Dry  Psychiatry: Appropriate   Data Reviewed: CBC: Recent Labs  Lab 12/15/18 1655 12/16/18 1138 12/17/18 0512  WBC 15.1* 14.2* 9.3  NEUTROABS 13.6* 12.4*  --   HGB 12.6* 12.0* 11.6*  HCT 39.6 38.0* 37.7*  MCV 95.4 95.7 96.9  PLT 232 230 841   Basic Metabolic Panel: Recent Labs  Lab 12/15/18 1655 12/16/18 1138 12/17/18 0512 12/18/18 0518  NA 137 139 140 137  K 3.5 3.3* 3.6 3.8  CL 91* 90* 94* 90*  CO2 36* 38* 38* 39*  GLUCOSE 140* 119* 157* 157*  BUN 18 20 16 23   CREATININE 0.64 0.71 0.58* 0.78  CALCIUM 9.1 9.0 9.1 8.8*  MG  --   --   --  2.0   GFR: Estimated Creatinine Clearance: 77.5 mL/min (by C-G formula based on SCr of 0.78 mg/dL). Liver Function Tests: Recent Labs  Lab 12/15/18 1655 12/16/18 1138  AST 16 16  ALT 13 12  ALKPHOS 72 71  BILITOT 0.9 0.8  PROT 6.7 6.5  ALBUMIN 3.5 3.2*   No results for input(s): LIPASE, AMYLASE in the last 168 hours. No results for input(s): AMMONIA in the last 168 hours. Coagulation  Profile: No results for input(s): INR, PROTIME in the last 168 hours. Cardiac Enzymes: Recent Labs  Lab 12/16/18 1138  TROPONINI <0.03   BNP (last 3 results) No results for input(s): PROBNP in the last 8760 hours. HbA1C: No results for input(s): HGBA1C in the last 72 hours. CBG: Recent Labs  Lab 12/19/18 2225 12/20/18 0747 12/20/18 1110 12/20/18 1611  GLUCAP 126* 121* 130* 211*   Lipid Profile: No results for input(s): CHOL, HDL, LDLCALC, TRIG, CHOLHDL, LDLDIRECT in the last 72 hours. Thyroid Function Tests: Recent Labs    12/18/18 0518  TSH 0.427  FREET4 1.05   Anemia Panel: Recent Labs    12/18/18 0518  VITAMINB12 546  FOLATE 10.8    Urine analysis:    Component Value Date/Time   COLORURINE YELLOW 12/16/2018 1138   APPEARANCEUR HAZY (A) 12/16/2018 1138   LABSPEC 1.024 12/16/2018 1138   PHURINE 5.0 12/16/2018 1138   GLUCOSEU NEGATIVE 12/16/2018 1138   GLUCOSEU NEGATIVE 04/09/2018 1050   HGBUR SMALL (A) 12/16/2018 1138   BILIRUBINUR NEGATIVE 12/16/2018 1138   KETONESUR 5 (A) 12/16/2018 1138   PROTEINUR 30 (A) 12/16/2018 1138   UROBILINOGEN 0.2 04/09/2018 1050   NITRITE NEGATIVE 12/16/2018 1138   LEUKOCYTESUR NEGATIVE 12/16/2018 1138   Sepsis Labs: @LABRCNTIP (procalcitonin:4,lacticidven:4)  ) Recent Results (from the past 240 hour(s))  Culture, blood (Routine X 2) w Reflex to ID Panel     Status: None (Preliminary result)   Collection Time: 12/16/18  8:35 PM  Result Value Ref Range Status   Specimen Description BLOOD LEFT ANTECUBITAL  Final   Special Requests   Final    BOTTLES DRAWN AEROBIC AND ANAEROBIC Blood Culture adequate volume   Culture   Final    NO GROWTH 4 DAYS Performed at [facility], [address] [place], [place] [address]    Report Status PENDING  Incomplete      Studies: No results found.  Scheduled Meds: . amLODipine  2.5 mg Oral Daily  . atorvastatin  80 mg Oral QPM  . budesonide (PULMICORT) nebulizer solution  0.5 mg Nebulization BID  . cholecalciferol  5,000 Units Oral q morning - 10a  . clopidogrel  75 mg Oral Daily  . divalproex  250 mg Oral QPM  . donepezil  10 mg Oral q morning - 10a  . escitalopram  5 mg Oral Daily  . feeding supplement (ENSURE ENLIVE)  237 mL Oral BID BM  . heparin  5,000 Units Subcutaneous Q8H  . multivitamin-lutein  1 capsule Oral Daily  . predniSONE  50 mg Oral Q breakfast  . sodium chloride  2 spray Each Nare TID  . sodium chloride flush  3 mL Intravenous Q12H  . tamsulosin  0.4 mg Oral Daily    Continuous Infusions: . sodium chloride    . dextrose 5 % and 0.45% NaCl 40 mL/hr at 12/16/18 1834     LOS: 2 days     Cristal Deer, MD  [facility]  To reach me or the doctor on call, go to: [email or web address] Password [facility]  12/20/2018, 8:34 PM

## 2018-12-21 LAB — GLUCOSE, CAPILLARY
GLUCOSE-CAPILLARY: 103 mg/dL — AB (ref 70–99)
Glucose-Capillary: 131 mg/dL — ABNORMAL HIGH (ref 70–99)
Glucose-Capillary: 229 mg/dL — ABNORMAL HIGH (ref 70–99)
Glucose-Capillary: 151 mg/dL — ABNORMAL HIGH (ref 70–99)

## 2018-12-21 NOTE — Plan of Care (Signed)

## 2018-12-21 NOTE — Progress Notes (Signed)
PROGRESS NOTE  COMPTON BRIGANCE VOZ:[id number] DOB: Nov 03, 1933 DOA: 12/16/2018 PCP: Lance Sell, NP  HPI/Recap of past 90 hours: 85 year old male with COPD chronic respiratory failure on 3 L/min nasal cannula at home type 2 diabetes mellitus vascular dementia hyperlipidemia who was admitted with epistaxis and increased generalized weakness.  Patient seen at bedside he is sitting in a chair his recliner chair.  Family is at bedside  December 21, 2018, subjective: Patient seen at bedside, he is in bed he is mouth breathing his oxygen is on.  His wife Mechele Claude is at bedside.  He denies any complaints he said he had a pretty good night even though the wife was complaining that he has been dozing off this morning  Assessment/Plan: Principal Problem:   Frequent nosebleeds Active Problems:   COPD exacerbation (HCC)   Essential hypertension   CVA (cerebral vascular accident) ([place])   Chronic respiratory failure with hypoxia ([place])   History of stroke   Vascular dementia without behavioral disturbance ([place])   COPD with acute exacerbation (HCC)   Malnutrition of moderate degree  1.  COPD.  Continue Pulmicort IV Solu-Medrol and duo nebs as well as doxycycline  2.  Failure to thrive with generalized weakness/debility.PT/OT evaluations have been performed. SNF is recommended. SNF is appropriate as the patient has received 3 days of hospital care (or the 3 day period has bee waived by the patient's insurance company) and is felt to need rehab services to restore this patient to their prior level of function to achieve safe transition back to home care. This patient needs rehab services for at least 5 days per week and skilled nursing services daily to facilitate this transition. Rehab is being requested as the most appropriate discharge option for this patient and NOT felt to be for custodial care as evidenced by his ability to ambulate with minimal assistance prior to admission. Waiting for  insurance authorization and approval.  3.  Chronic respiratory failure on home O2 patient currently requiring 4 L/min  4.  Recurrent epistaxis patient is on aspirin and Plavix.  His hemoglobin is stable he is no longer bleeding  5.  History of CVA his aspirin has been discontinued but he will continue the Plavix  6.  Dementia vascular dementia we will continue Aricept and Depakote  Code Status: Full  Family Communication: Wife at bedside  Disposition Plan: SNF.Waiting for insurance approval for SNF   Consultants:  None  Procedures:  None  Antimicrobials:  Doxycycline  DVT prophylaxis: SCD   Objective: Vitals:   12/21/18 1229 12/21/18 1403  BP:  120/62  Pulse:  (!) 56  Resp:  18  Temp:  98.2 F (36.8 C)  SpO2: 98% 93%    Intake/Output Summary (Last 24 hours) at 12/21/2018 1712 Last data filed at 12/21/2018 1300 Gross per 24 hour  Intake 540 ml  Output 851 ml  Net -311 ml   Filed Weights   12/16/18 1001  Weight: 77 kg   Body mass index is 23.02 kg/m.  Exam:   General: Alert oriented to person frail looking  Cardiovascular: Heart rate regular rate and rhythm  Respiratory: O2 nasal cannula in use at 3 L to 4 L/min mild respiratory distress at rest  Abdomen: Soft nontender  Musculoskeletal: No lower extremity edema  Skin: Dry  Psychiatry: Appropriate   Data Reviewed: CBC: Recent Labs  Lab 12/15/18 1655 12/16/18 1138 12/17/18 0512  WBC 15.1* 14.2* 9.3  NEUTROABS 13.6* 12.4*  --  HGB 12.6* 12.0* 11.6*  HCT 39.6 38.0* 37.7*  MCV 95.4 95.7 96.9  PLT 232 230 950   Basic Metabolic Panel: Recent Labs  Lab 12/15/18 1655 12/16/18 1138 12/17/18 0512 12/18/18 0518  NA 137 139 140 137  K 3.5 3.3* 3.6 3.8  CL 91* 90* 94* 90*  CO2 36* 38* 38* 39*  GLUCOSE 140* 119* 157* 157*  BUN 18 20 16 23   CREATININE 0.64 0.71 0.58* 0.78  CALCIUM 9.1 9.0 9.1 8.8*  MG  --   --   --  2.0   GFR: Estimated Creatinine Clearance: 77.5 mL/min (by C-G  formula based on SCr of 0.78 mg/dL). Liver Function Tests: Recent Labs  Lab 12/15/18 1655 12/16/18 1138  AST 16 16  ALT 13 12  ALKPHOS 72 71  BILITOT 0.9 0.8  PROT 6.7 6.5  ALBUMIN 3.5 3.2*   No results for input(s): LIPASE, AMYLASE in the last 168 hours. No results for input(s): AMMONIA in the last 168 hours. Coagulation Profile: No results for input(s): INR, PROTIME in the last 168 hours. Cardiac Enzymes: Recent Labs  Lab 12/16/18 1138  TROPONINI <0.03   BNP (last 3 results) No results for input(s): PROBNP in the last 8760 hours. HbA1C: No results for input(s): HGBA1C in the last 72 hours. CBG: Recent Labs  Lab 12/20/18 1611 12/20/18 2101 12/21/18 0729 12/21/18 1116 12/21/18 1611  GLUCAP 211* 184* 103* 131* 229*   Lipid Profile: No results for input(s): CHOL, HDL, LDLCALC, TRIG, CHOLHDL, LDLDIRECT in the last 72 hours. Thyroid Function Tests: No results for input(s): TSH, T4TOTAL, FREET4, T3FREE, THYROIDAB in the last 72 hours. Anemia Panel: No results for input(s): VITAMINB12, FOLATE, FERRITIN, TIBC, IRON, RETICCTPCT in the last 72 hours. Urine analysis:    Component Value Date/Time   COLORURINE YELLOW 12/16/2018 1138   APPEARANCEUR HAZY (A) 12/16/2018 1138   LABSPEC 1.024 12/16/2018 1138   PHURINE 5.0 12/16/2018 1138   GLUCOSEU NEGATIVE 12/16/2018 1138   GLUCOSEU NEGATIVE 04/09/2018 1050   HGBUR SMALL (A) 12/16/2018 1138   BILIRUBINUR NEGATIVE 12/16/2018 1138   KETONESUR 5 (A) 12/16/2018 1138   PROTEINUR 30 (A) 12/16/2018 1138   UROBILINOGEN 0.2 04/09/2018 1050   NITRITE NEGATIVE 12/16/2018 1138   LEUKOCYTESUR NEGATIVE 12/16/2018 1138   Sepsis Labs: @LABRCNTIP (procalcitonin:4,lacticidven:4)  ) Recent Results (from the past 240 hour(s))  Culture, blood (Routine X 2) w Reflex to ID Panel     Status: None (Preliminary result)   Collection Time: 12/16/18  8:35 PM  Result Value Ref Range Status   Specimen Description BLOOD LEFT ANTECUBITAL  Final    Special Requests   Final    BOTTLES DRAWN AEROBIC AND ANAEROBIC Blood Culture adequate volume   Culture   Final    NO GROWTH 4 DAYS Performed at [facility], [address] [place],  [address]    Report Status PENDING  Incomplete      Studies: No results found.  Scheduled Meds: . amLODipine  2.5 mg Oral Daily  . atorvastatin  80 mg Oral QPM  . budesonide (PULMICORT) nebulizer solution  0.5 mg Nebulization BID  . cholecalciferol  5,000 Units Oral q morning - 10a  . clopidogrel  75 mg Oral Daily  . divalproex  250 mg Oral QPM  . donepezil  10 mg Oral q morning - 10a  . escitalopram  5 mg Oral Daily  . feeding supplement (ENSURE ENLIVE)  237 mL Oral BID BM  . heparin  5,000 Units Subcutaneous Q8H  .  multivitamin-lutein  1 capsule Oral Daily  . predniSONE  50 mg Oral Q breakfast  . sodium chloride  2 spray Each Nare TID  . sodium chloride flush  3 mL Intravenous Q12H  . tamsulosin  0.4 mg Oral Daily    Continuous Infusions: . sodium chloride    . dextrose 5 % and 0.45% NaCl 40 mL/hr at 12/16/18 1834     LOS: 3 days     Cristal Deer, MD [facility]  To reach me or the doctor on call, go to: [email or web address] Password [facility]  12/21/2018, 5:12 PM

## 2018-12-22 DIAGNOSIS — Z7189 Other specified counseling: Secondary | ICD-10-CM

## 2018-12-22 LAB — GLUCOSE, CAPILLARY
GLUCOSE-CAPILLARY: 118 mg/dL — AB (ref 70–99)
Glucose-Capillary: 103 mg/dL — ABNORMAL HIGH (ref 70–99)

## 2018-12-22 LAB — CULTURE, BLOOD (ROUTINE X 2)
Culture: NO GROWTH
Special Requests: ADEQUATE

## 2018-12-22 MED ORDER — PREDNISONE 10 MG PO TABS: 50.0000 mg | ORAL_TABLET | Freq: Every day | ORAL | 0 refills | Status: AC

## 2018-12-22 MED ORDER — SALINE SPRAY 0.65 % NA SOLN: 2.0000 | Freq: Three times a day (TID) | NASAL | 0 refills | Status: AC

## 2018-12-22 NOTE — Progress Notes (Signed)
Physical Therapy Treatment Patient Details Name: Luis [name] MRN: [id number] DOB: 11-Nov-1933 Today's Date: 12/22/2018    History of Present Illness The patient is an 85 year old male, he is anticoagulated on Plavix.  Presents with epistaxis from the right nostril.  The patient has had several days of coughing and shortness of breath but today started having epistaxis.  He tried to apply pressure but continues to have a slow and steady drip.  Symptoms are persistent, on my exam he does have some right-sided anterior epistaxis which is well visualized.  He has only a minimal amount of blood in his posterior pharynx.    PT Comments    Patient presents supine in bed with family at bedside and agreeable to therapy. Pt able to complete transfers and ambulation in room with RW and min guard assist with verbal cues for hand placement, safety cues, and O2 line management. Pt initially 95% on 3 LPM O2 and desat to 85% with transfers and gait improving to 96% with seated rest and pursed lip breathing exercises. Pt limited due to lunch tray arrival causing treatment session to end early. Pt and pt's family reporting pt will be D/C to SNF this afternoon for rehab. Pt left up in chair with chair alarm on, call bell at side and family in room with 3 LPM O2 on and 96% saturation. Pt will benefit from continued physical therapy in hospital and recommended venue below to increase strength, balance, endurance for safe ADLs and gait.    Follow Up Recommendations  SNF;Supervision/Assistance - 24 hour;Supervision for mobility/OOB     Equipment Recommendations  None recommended by PT    Recommendations for Other Services       Precautions / Restrictions Precautions Precautions: Fall Restrictions Weight Bearing Restrictions: No    Mobility  Bed Mobility Overal bed mobility: Needs Assistance Bed Mobility: Supine to Sit     Supine to sit: Supervision     General bed mobility comments: elevated  HOB  Transfers Overall transfer level: Needs assistance Equipment used: Rolling walker (2 wheeled) Transfers: Sit to/from [facility] Sit to Stand: Min guard Stand pivot transfers: Min guard       General transfer comment: increased time, verbal cues for hand placement and sequencing  Ambulation/Gait Ambulation/Gait assistance: Min guard Gait Distance (Feet): 10 Feet Assistive device: Rolling walker (2 wheeled) Gait Pattern/deviations: Decreased step length - right;Decreased step length - left;Decreased stride length Gait velocity: decreased   General Gait Details: labored cadence, limited to ambualtion in room due to lunch tray arrival and desat to 85% on 3 LPM   Stairs             Wheelchair Mobility    Modified Rankin (Stroke Patients Only)       Balance Overall balance assessment: Needs assistance Sitting-balance support: Feet supported;No upper extremity supported Sitting balance-Leahy Scale: Good Sitting balance - Comments: seated EOB   Standing balance support: Bilateral upper extremity supported;During functional activity Standing balance-Leahy Scale: Fair Standing balance comment: with RW                            Cognition Arousal/Alertness: Awake/alert Behavior During Therapy: WFL for tasks assessed/performed Overall Cognitive Status: Within Functional Limits for tasks assessed  Exercises      General Comments        Pertinent Vitals/Pain Pain Assessment: No/denies pain    Home Living                      Prior Function            PT Goals (current goals can now be found in the care plan section) Acute Rehab PT Goals Patient Stated Goal: return home PT Goal Formulation: With patient/family Time For Goal Achievement: 12/30/18 Potential to Achieve Goals: Good Progress towards PT goals: Progressing toward goals    Frequency    Min  3X/week      PT Plan Current plan remains appropriate    Co-evaluation              AM-PAC PT "6 Clicks" Mobility   Outcome Measure  Help needed turning from your back to your side while in a flat bed without using bedrails?: None Help needed moving from lying on your back to sitting on the side of a flat bed without using bedrails?: None Help needed moving to and from a bed to a chair (including a wheelchair)?: A Little Help needed standing up from a chair using your arms (e.g., wheelchair or bedside chair)?: A Little Help needed to walk in hospital room?: A Little Help needed climbing 3-5 steps with a railing? : A Lot 6 Click Score: 19    End of Session Equipment Utilized During Treatment: Gait belt;Oxygen(3 LPM) Activity Tolerance: Patient tolerated treatment well;Patient limited by fatigue Patient left: in chair;with call bell/phone within reach;with chair alarm set;with family/visitor present(Spouse and son at bedside) Nurse Communication: Mobility status PT Visit Diagnosis: Unsteadiness on feet (R26.81);Other abnormalities of gait and mobility (R26.89);Muscle weakness (generalized) (M62.81)     Time: 5364-6803 PT Time Calculation (min) (ACUTE ONLY): 15 min  Charges:  $Therapeutic Activity: 8-22 mins                     12:19 PM, 12/22/18 Talbot Grumbling, DPT Physical Therapist with [facility] [phone] office

## 2018-12-22 NOTE — Discharge Summary (Signed)
Physician Discharge Summary  Luis [name] KDX:[id number] DOB: 09-29-1934 DOA: 12/16/2018  PCP: Lance Sell, NP  Admit date: 12/16/2018 Discharge date: 12/22/2018  Admitted From: Home Disposition:  Home / SNF  Recommendations for Outpatient Follow-up:  1. Follow up with PCP in 1-2 weeks 2. Please obtain BMP/CBC in one week    Discharge Condition: Stable CODE STATUS:DNR Diet recommendation: Heart Healthy / Carb Modified    Brief/Interim Summary: 85 year old male with a history of COPD, chronic respiratory failure on 3 L nasal cannula, diabetes mellitus type 2, vascular dementia, hyperlipidemia, hypertension presenting with epistaxis and increasing generalized weakness. The patient was seen in the emergency department on 12/15/2018 secondary to epistaxis. The patient underwent cauterization of his nare and was discharged home in stable condition. Unfortunately, on the morning of 12/16/2018, the patient had recurrent epistaxis prompting him to return to the emergency department. ED physician applied Neo-Synephrine and obtained hemostasis.EDP attempted to discharge patient home with family, but the patient's family was concerned about the patient's increasing generalized weakness and debilitation and the wife's inability to care for him. In addition, the wife also gives a history of increasing shortness of breath over the past 3 to 4 days prior to admission with increasing cough. There is no fevers, chills, nausea, vomiting, diarrhea, abdominal pain, chest pain. EDP contacted case manager to help with disposition all possible home health services. Case manager did not feel that the patient's home situation was safe and recommended transition to a skilled nursing facility. PT evaluated the patient and recommended skilled nursing facility.  Prior to admission, patient's wife stated that patient was able to ambulate with minimal assist, only occasionally using the walls or furniture  to steady himself.  However, in the past 1-2 weeks, the patient has gotten weaker to point of needing the use of a rollator walker to get around.  After discussion with patient family/spouse, it was felt that given functional decline and increase need for assistance that it will not be a safe discharge situation.  Therefore, spouse and family have requested short term rehab in SNF to help restore his functional status.  Discharge Diagnoses:  COPD Exacerbation -Continuepulmicort -ContinueIV solumedrol>>>prednisone taper -Conitnueduonebs q 6 during the hospitalization -finished 5 days of doxycycline --PFT2/5/14- severe obstructive airways disease with slight response to bronchodilator. Air-trapping with increased residual volume. Diffusion mildly reduced. Emphysema pattern. FVC 2.56/59%, FEV1 1.17/41%, FEV1/FVC 0.45. Residual volume 132%, DLCO 78%.  Failure to thrive/generalized weakness -The patient has had worsening oral intake and generalized weakness -At baseline, the patient is able to ambulate with minimal assistance, but has had to use a Rollator in the past week prior to admission. -Serum Z76--734 -LPF--7.902 -Folic IOXB--35.3 -Urinalysis without significant pyuria -PT evaluation-->skilled nursing facility PT/OT evaluations have been performed. SNF is recommended. SNF is appropriate as the patient has received 3 days of hospital care (or the 3 day period has bee waived by the patient's insurance company) and is felt to need rehab services to restore this patient to their prior level of function to achieve safe transition back to home care. This patient needs rehab services for at least 5 days per week and skilled nursing services daily to facilitate this transition. Rehab is being requested as the most appropriate discharge option for this patient and NOT felt to be for custodial care as evidenced by his ability to ambulate with minimal assistance prior to admission.  Chronic  respiratory failure with hypoxia -Patient is chronically on 2-3 L nasal cannula -pt initially  required 5 L [place], now back to 3L  Recurrent epistaxis -Secondary to the patient being on both aspirin and Plavix in the setting of poor humidification with his oxygen -Hemoglobin overall stable -Hemostasis achieved  History of CVA -Patient had stroke 2016 and was taking aspirin and Plavix -This likely contributed to the patient's epistaxis -Discontinue aspirin -Continue Plavix monotherapy for secondary stroke prophylaxis -No history of coronary stents or peripheral arterial disease with stents  Vascular dementia -Continue Aricept and Depakote -no behavioral disturbance noted during this admission  Leukocytosis -Likely due to stress demargination -Patient had not started prednisone prior to admission -Overall improved -Patient is afebrile hemodynamically stable  Essential hypertension -Continue amlodipine  Hyperlipidemia -Continue statin  Hypokalemia -repleted -check mag--2.0  Moderate malnutrition -continue supplements  Advance care planning, including the explanation and discussion of advance directives was carried out with the patient and family.  Code status including explanations of "Full Code" and "DNR" and alternatives were discussed in detail.  Discussion of end-of-life issues including but not limited palliative care, hospice care and the concept of hospice, other end-of-life care options, power of attorney for health care decisions, living wills, and physician orders for life-sustaining treatment were also discussed with the patient and family.  -pt changed to DNR  Total face to face time 16 minutes.     Discharge Instructions   Allergies as of 12/22/2018      Reactions   Tranexamic Acid Anaphylaxis   Had in 09/09/2016 in [facility] ED and had anaphylaxis reaction      Medication List    STOP taking these medications   aspirin 325 MG EC tablet   doxycycline  100 MG capsule Commonly known as:  VIBRAMYCIN     TAKE these medications   albuterol 108 (90 Base) MCG/ACT inhaler Commonly known as:  PROVENTIL HFA;VENTOLIN HFA Inhale 2 puffs into the lungs every 6 (six) hours as needed for wheezing or shortness of breath.   amLODipine 2.5 MG tablet Commonly known as:  NORVASC TAKE ONE TABLET BY MOUTH DAILY What changed:  when to take this   atorvastatin 80 MG tablet Commonly known as:  LIPITOR TAKE ONE TABLET BY MOUTH DAILY. What changed:  when to take this   clopidogrel 75 MG tablet Commonly known as:  PLAVIX Take 1 tablet (75 mg total) by mouth daily. What changed:  when to take this   D3 Maximum Strength 125 MCG (5000 UT) capsule Generic drug:  Cholecalciferol Take 5,000 Units by mouth every morning.   divalproex 250 MG 24 hr tablet Commonly known as:  DEPAKOTE ER Take 1 tablet (250 mg total) by mouth daily. What changed:  when to take this   donepezil 10 MG tablet Commonly known as:  ARICEPT Take 1 tablet daily What changed:    how much to take  how to take this  when to take this  additional instructions   escitalopram 5 MG tablet Commonly known as:  LEXAPRO Take 1 tablet (5 mg total) by mouth daily. What changed:  when to take this   Flutter Evaristo Bury through as directed   nitroGLYCERIN 0.4 MG SL tablet Commonly known as:  NITROSTAT Place 0.4 mg under the tongue every 5 (five) minutes as needed for chest pain. Reported on 03/19/2016   OXYGEN Inhale 3 L into the lungs continuous. On 3 liters during the day and at night   Perforomist 20 MCG/2ML nebulizer solution Generic drug:  formoterol INHALE CONTENTS OF 1 VIAL IN NEBULIZER TWICE DAILY.  What changed:  See the new instructions.   predniSONE 10 MG tablet Commonly known as:  DELTASONE Take 5 tablets (50 mg total) by mouth daily with breakfast. And decrease by one tablet daily Start taking on:  December 23, 2018 What changed:    how much to take  when to  take this  additional instructions   sodium chloride 0.65 % Soln nasal spray Commonly known as:  OCEAN Place 2 sprays into both nostrils 3 (three) times daily.   tamsulosin 0.4 MG Caps capsule Commonly known as:  FLOMAX TAKE ONE CAPSULE BY MOUTH DAILY. What changed:  when to take this      Contact information for after-discharge care    [facility] .   Service:  Skilled Nursing Contact information: [address] [address] [phone]             Allergies  Allergen Reactions   Tranexamic Acid Anaphylaxis    Had in 09/09/2016 in [facility] ED and had anaphylaxis reaction    Consultations:  none   Procedures/Studies: Dg Chest 2 View  Result Date: 12/16/2018 CLINICAL DATA:  Shortness of breath.  Cough. EXAM: CHEST - 2 VIEW COMPARISON:  Chest x-rays dated 12/15/2018 and 01/09/2018 and chest CT dated 01/09/2018 FINDINGS: The heart size and pulmonary vascularity are normal. Aortic atherosclerosis. No infiltrates or effusions. The lungs are hyperinflated with flattening of the diaphragm consistent with emphysema. Scarring in the lingula, chronic. No significant bone abnormality. IMPRESSION: 1. No acute abnormalities. 2. Aortic Atherosclerosis (ICD10-I70.0) and Emphysema (ICD10-J43.9). Electronically Signed   By: Lorriane Shire M.D.   On: 12/16/2018 12:16   Dg Chest Port 1 View  Result Date: 12/15/2018 CLINICAL DATA:  Cough.  Nose bleed today. EXAM: PORTABLE CHEST 1 VIEW COMPARISON:  01/09/2018 FINDINGS: Heart size is normal. There is ordinary aortic atherosclerosis. The pulmonary vascularity is normal. The lungs are clear. No effusions. No significant bone finding. IMPRESSION: No active disease. Electronically Signed   By: Nelson Chimes M.D.   On: 12/15/2018 17:31        Discharge Exam: Vitals:   12/22/18 0538 12/22/18 0907  BP: 119/69   Pulse: (!) 51   Resp:    Temp: 98.9 F (37.2 C)   SpO2: 98% 98%    Vitals:   12/21/18 1948 12/21/18 2155 12/22/18 0538 12/22/18 0907  BP:  (!) 92/59 119/69   Pulse:  (!) 55 (!) 51   Resp:  18    Temp:  98.2 F (36.8 C) 98.9 F (37.2 C)   TempSrc:  Oral Oral   SpO2: 94% 99% 98% 98%  Weight:      Height:        General: Pt is alert, awake, not in acute distress Cardiovascular: RRR, S1/S2 +, no rubs, no gallops Respiratory: CTA bilaterally, no wheezing, no rhonchi Abdominal: Soft, NT, ND, bowel sounds + Extremities: no edema, no cyanosis   The results of significant diagnostics from this hospitalization (including imaging, microbiology, ancillary and laboratory) are listed below for reference.    Significant Diagnostic Studies: Dg Chest 2 View  Result Date: 12/16/2018 CLINICAL DATA:  Shortness of breath.  Cough. EXAM: CHEST - 2 VIEW COMPARISON:  Chest x-rays dated 12/15/2018 and 01/09/2018 and chest CT dated 01/09/2018 FINDINGS: The heart size and pulmonary vascularity are normal. Aortic atherosclerosis. No infiltrates or effusions. The lungs are hyperinflated with flattening of the diaphragm consistent with emphysema. Scarring in the lingula,  chronic. No significant bone abnormality. IMPRESSION: 1. No acute abnormalities. 2. Aortic Atherosclerosis (ICD10-I70.0) and Emphysema (ICD10-J43.9). Electronically Signed   By: Lorriane Shire M.D.   On: 12/16/2018 12:16   Dg Chest Port 1 View  Result Date: 12/15/2018 CLINICAL DATA:  Cough.  Nose bleed today. EXAM: PORTABLE CHEST 1 VIEW COMPARISON:  01/09/2018 FINDINGS: Heart size is normal. There is ordinary aortic atherosclerosis. The pulmonary vascularity is normal. The lungs are clear. No effusions. No significant bone finding. IMPRESSION: No active disease. Electronically Signed   By: Nelson Chimes M.D.   On: 12/15/2018 17:31     Microbiology: Recent Results (from the past 240 hour(s))  Culture, blood (Routine X 2) w Reflex to ID Panel     Status: None   Collection Time: 12/16/18  8:35 PM  Result  Value Ref Range Status   Specimen Description BLOOD LEFT ANTECUBITAL  Final   Special Requests   Final    BOTTLES DRAWN AEROBIC AND ANAEROBIC Blood Culture adequate volume   Culture   Final    NO GROWTH 6 DAYS Performed at [facility], [address], [place], [place] [address]    Report Status 12/22/2018 FINAL  Final     Labs: Basic Metabolic Panel: Recent Labs  Lab 12/15/18 1655 12/16/18 1138 12/17/18 0512 12/18/18 0518  NA 137 139 140 137  K 3.5 3.3* 3.6 3.8  CL 91* 90* 94* 90*  CO2 36* 38* 38* 39*  GLUCOSE 140* 119* 157* 157*  BUN 18 20 16 23   CREATININE 0.64 0.71 0.58* 0.78  CALCIUM 9.1 9.0 9.1 8.8*  MG  --   --   --  2.0   Liver Function Tests: Recent Labs  Lab 12/15/18 1655 12/16/18 1138  AST 16 16  ALT 13 12  ALKPHOS 72 71  BILITOT 0.9 0.8  PROT 6.7 6.5  ALBUMIN 3.5 3.2*   No results for input(s): LIPASE, AMYLASE in the last 168 hours. No results for input(s): AMMONIA in the last 168 hours. CBC: Recent Labs  Lab 12/15/18 1655 12/16/18 1138 12/17/18 0512  WBC 15.1* 14.2* 9.3  NEUTROABS 13.6* 12.4*  --   HGB 12.6* 12.0* 11.6*  HCT 39.6 38.0* 37.7*  MCV 95.4 95.7 96.9  PLT 232 230 242   Cardiac Enzymes: Recent Labs  Lab 12/16/18 1138  TROPONINI <0.03   BNP: Invalid input(s): POCBNP CBG: Recent Labs  Lab 12/21/18 0729 12/21/18 1116 12/21/18 1611 12/21/18 2156 12/22/18 0735  GLUCAP 103* 131* 229* 151* 103*    Time coordinating discharge:  36 minutes  Signed:  Orson Eva, DO [facility] Pager: [phone] 12/22/2018, 10:42 AM

## 2018-12-22 NOTE — Care Management Important Message (Signed)
Important Message  Patient Details  Name: Luis [name] MRN: [id number] Date of Birth: [birth year]   Medicare Important Message Given:  Yes    Tommy Medal 12/22/2018, 11:26 AM

## 2018-12-22 NOTE — Plan of Care (Signed)

## 2018-12-22 NOTE — Plan of Care (Signed)
  Problem: Education: Goal: Knowledge of General Education information will improve Description Including pain rating scale, medication(s)/side effects and non-pharmacologic comfort measures 12/22/2018 1223 by Rance Muir, RN Outcome: Adequate for Discharge 12/22/2018 1213 by Rance Muir, RN Outcome: Progressing   Problem: Health Behavior/Discharge Planning: Goal: Ability to manage health-related needs will improve 12/22/2018 1223 by Rance Muir, RN Outcome: Adequate for Discharge 12/22/2018 1213 by Rance Muir, RN Outcome: Progressing   Problem: Clinical Measurements: Goal: Ability to maintain clinical measurements within normal limits will improve 12/22/2018 1223 by Rance Muir, RN Outcome: Adequate for Discharge 12/22/2018 1213 by Rance Muir, RN Outcome: Progressing Goal: Will remain free from infection 12/22/2018 1223 by Rance Muir, RN Outcome: Adequate for Discharge 12/22/2018 1213 by Rance Muir, RN Outcome: Progressing Goal: Diagnostic test results will improve 12/22/2018 1223 by Rance Muir, RN Outcome: Adequate for Discharge 12/22/2018 1213 by Rance Muir, RN Outcome: Progressing Goal: Respiratory complications will improve 12/22/2018 1223 by Rance Muir, RN Outcome: Adequate for Discharge 12/22/2018 1213 by Rance Muir, RN Outcome: Progressing Goal: Cardiovascular complication will be avoided 12/22/2018 1223 by Rance Muir, RN Outcome: Adequate for Discharge 12/22/2018 1213 by Rance Muir, RN Outcome: Progressing   Problem: Activity: Goal: Risk for activity intolerance will decrease 12/22/2018 1223 by Rance Muir, RN Outcome: Adequate for Discharge 12/22/2018 1213 by Rance Muir, RN Outcome: Progressing   Problem: Nutrition: Goal: Adequate nutrition will be maintained 12/22/2018 1223 by Rance Muir, RN Outcome: Adequate for Discharge 12/22/2018 1213 by Rance Muir, RN Outcome: Progressing   Problem: Coping: Goal: Level of anxiety will decrease 12/22/2018 1223 by Rance Muir, RN Outcome: Adequate for  Discharge 12/22/2018 1213 by Rance Muir, RN Outcome: Progressing   Problem: Elimination: Goal: Will not experience complications related to bowel motility 12/22/2018 1223 by Rance Muir, RN Outcome: Adequate for Discharge 12/22/2018 1213 by Rance Muir, RN Outcome: Progressing Goal: Will not experience complications related to urinary retention 12/22/2018 1223 by Rance Muir, RN Outcome: Adequate for Discharge 12/22/2018 1213 by Rance Muir, RN Outcome: Progressing   Problem: Pain Managment: Goal: General experience of comfort will improve 12/22/2018 1223 by Rance Muir, RN Outcome: Adequate for Discharge 12/22/2018 1213 by Rance Muir, RN Outcome: Progressing   Problem: Safety: Goal: Ability to remain free from injury will improve 12/22/2018 1223 by Rance Muir, RN Outcome: Adequate for Discharge 12/22/2018 1213 by Rance Muir, RN Outcome: Progressing   Problem: Skin Integrity: Goal: Risk for impaired skin integrity will decrease 12/22/2018 1223 by Rance Muir, RN Outcome: Adequate for Discharge 12/22/2018 1213 by Rance Muir, RN Outcome: Progressing

## 2018-12-22 NOTE — Progress Notes (Signed)
Report called to [facility] at this time

## 2018-12-22 NOTE — TOC Transition Note (Signed)
Transition of Care [facility]) - CM/SW Discharge Note   Patient Details  Name: HENDERSON FRAMPTON MRN: [id number] Date of Birth: 1934-01-04  Transition of Care [facility]) CM/SW Contact:  Ihor Gully, LCSW Phone Number: 12/22/2018, 12:30 PM   Clinical Narrative:     Patient will discharge to [facility] today.  Discharge clinicals sent to facility and facility is aware of discharge today.   Final next level of care: [place] Barriers to Discharge: No Barriers Identified   Patient Goals and CMS Choice Patient states their goals for this hospitalization and ongoing recovery are:: become more ind so that he may return home CMS Medicare.gov Compare Post Acute Care list provided to:: Patient Represenative (must comment) Choice offered to / list presented to : Spouse, Adult Children  Discharge Placement PASRR number recieved: 12/16/18            Patient chooses bed at: [facility] Patient to be transferred to facility by: [facility] Name of family member notified: Mrs. Borawski, spouse Patient and family notified of of transfer: 12/22/18  Discharge Plan and Services   Post Acute Care Choice: [place]                    Social Determinants of Health (SDOH) Interventions     Readmission Risk Interventions No flowsheet data found.

## 2018-12-23 ENCOUNTER — Telehealth: Payer: Self-pay | Admitting: *Deleted

## 2018-12-23 NOTE — Telephone Encounter (Signed)
Pt was on TCM report admitted 12/16/18 for recurrent epistaxis. Also the patient's family was concerned about the patient's increasing generalized weakness and debilitation and the wife's inability to care for him. The wife gives a history of increasing shortness of breath over the past 3 to 4 days prior to admission with increasing cough. There is no fevers, chills, nausea, vomiting, diarrhea, abdominal pain, chest pain. Pt D/C to SNF, but on summary will need to f/u w/PCP after leaving SNF.Marland KitchenJohny Chess

## 2018-12-24 ENCOUNTER — Telehealth: Payer: Self-pay | Admitting: Internal Medicine

## 2018-12-24 NOTE — Telephone Encounter (Signed)
Spoke with Alice at [facility] and gave the current dates of last flu and pneumonia vaccine.  She verbalized understanding.  Nothing further needed.

## 2019-01-02 ENCOUNTER — Telehealth: Payer: Self-pay | Admitting: Nurse Practitioner

## 2019-01-02 NOTE — Telephone Encounter (Signed)
Please advise 

## 2019-01-02 NOTE — Telephone Encounter (Signed)
Ok with me 

## 2019-01-02 NOTE — Telephone Encounter (Signed)
Copied from [place] [id number]. Topic: Appointment Scheduling - Scheduling Inquiry for Clinic >> Jan 02, 2019  2:11 PM Lennox Solders wrote: Reason for CRM:pt wife would like her husband to get est with dr Jenny Reichmann since [facility]ie [place] is leaving   Please advise Dr.John if you are okay with this?

## 2019-01-05 ENCOUNTER — Ambulatory Visit: Payer: Medicare Other | Admitting: Internal Medicine

## 2019-01-05 NOTE — Telephone Encounter (Signed)
I have informed spouse.  Right now patient is in Rehab for a month.  Once this is complete spouse will call to schedule rehab fu/ transfer fu.

## 2019-01-05 NOTE — Telephone Encounter (Signed)
Will forward to schedulers to arrange appt.Marland KitchenJohny [name]

## 2019-01-06 NOTE — Telephone Encounter (Signed)
Noted  

## 2019-01-13 ENCOUNTER — Ambulatory Visit: Payer: Medicare Other | Admitting: Neurology

## 2019-01-26 ENCOUNTER — Encounter (INDEPENDENT_AMBULATORY_CARE_PROVIDER_SITE_OTHER): Payer: Medicare Other | Admitting: Ophthalmology

## 2019-02-06 DEATH — deceased

## 2019-03-23 ENCOUNTER — Ambulatory Visit: Payer: Medicare Other | Admitting: Neurology

## 2019-03-30 ENCOUNTER — Encounter (INDEPENDENT_AMBULATORY_CARE_PROVIDER_SITE_OTHER): Payer: Medicare Other | Admitting: Ophthalmology

## 2019-04-16 ENCOUNTER — Ambulatory Visit: Payer: Medicare Other | Admitting: Internal Medicine
